# Patient Record
Sex: Female | Born: 1965 | Race: White | Hispanic: No | Marital: Single | Smoking: Never smoker
Health system: Southern US, Community
[De-identification: ages and names within clinical notes are randomized; demographics above are authoritative.]

## PROBLEM LIST (undated history)

## (undated) DIAGNOSIS — F419 Anxiety disorder, unspecified: Secondary | ICD-10-CM

## (undated) DIAGNOSIS — F329 Major depressive disorder, single episode, unspecified: Secondary | ICD-10-CM

## (undated) DIAGNOSIS — M1990 Unspecified osteoarthritis, unspecified site: Secondary | ICD-10-CM

## (undated) DIAGNOSIS — J45909 Unspecified asthma, uncomplicated: Secondary | ICD-10-CM

## (undated) DIAGNOSIS — J449 Chronic obstructive pulmonary disease, unspecified: Secondary | ICD-10-CM

## (undated) DIAGNOSIS — T7840XA Allergy, unspecified, initial encounter: Secondary | ICD-10-CM

## (undated) DIAGNOSIS — F32A Depression, unspecified: Secondary | ICD-10-CM

## (undated) DIAGNOSIS — E785 Hyperlipidemia, unspecified: Secondary | ICD-10-CM

## (undated) DIAGNOSIS — F4310 Post-traumatic stress disorder, unspecified: Secondary | ICD-10-CM

## (undated) HISTORY — PX: JOINT REPLACEMENT: SHX530

## (undated) HISTORY — DX: Allergy, unspecified, initial encounter: T78.40XA

## (undated) HISTORY — DX: Hyperlipidemia, unspecified: E78.5

## (undated) HISTORY — DX: Anxiety disorder, unspecified: F41.9

## (undated) HISTORY — DX: Unspecified osteoarthritis, unspecified site: M19.90

## (undated) HISTORY — PX: TONSILLECTOMY: SHX5217

## (undated) HISTORY — DX: Post-traumatic stress disorder, unspecified: F43.10

## (undated) HISTORY — PX: BREAST SURGERY: SHX581

## (undated) HISTORY — PX: BREAST LUMPECTOMY: SHX2

## (undated) HISTORY — DX: Depression, unspecified: F32.A

## (undated) HISTORY — DX: Unspecified asthma, uncomplicated: J45.909

## (undated) HISTORY — PX: REDUCTION MAMMAPLASTY: SUR839

## (undated) HISTORY — DX: Major depressive disorder, single episode, unspecified: F32.9

## (undated) HISTORY — DX: Chronic obstructive pulmonary disease, unspecified: J44.9

---

## 2016-04-10 DIAGNOSIS — T7840XA Allergy, unspecified, initial encounter: Secondary | ICD-10-CM | POA: Insufficient documentation

## 2016-04-10 DIAGNOSIS — J45909 Unspecified asthma, uncomplicated: Secondary | ICD-10-CM | POA: Insufficient documentation

## 2016-04-10 DIAGNOSIS — F419 Anxiety disorder, unspecified: Secondary | ICD-10-CM | POA: Insufficient documentation

## 2016-04-10 DIAGNOSIS — F4310 Post-traumatic stress disorder, unspecified: Secondary | ICD-10-CM | POA: Insufficient documentation

## 2016-04-10 DIAGNOSIS — F29 Unspecified psychosis not due to a substance or known physiological condition: Secondary | ICD-10-CM | POA: Insufficient documentation

## 2016-04-10 DIAGNOSIS — F32A Depression, unspecified: Secondary | ICD-10-CM | POA: Insufficient documentation

## 2016-04-10 DIAGNOSIS — F329 Major depressive disorder, single episode, unspecified: Secondary | ICD-10-CM | POA: Insufficient documentation

## 2016-11-02 ENCOUNTER — Encounter: Payer: Self-pay | Admitting: Physician Assistant

## 2016-11-02 ENCOUNTER — Ambulatory Visit (INDEPENDENT_AMBULATORY_CARE_PROVIDER_SITE_OTHER): Payer: Medicare Other | Admitting: Physician Assistant

## 2016-11-02 VITALS — BP 113/81 | HR 71 | Temp 97.3°F | Resp 16 | Ht 68.0 in | Wt 208.0 lb

## 2016-11-02 DIAGNOSIS — H16202 Unspecified keratoconjunctivitis, left eye: Secondary | ICD-10-CM

## 2016-11-02 NOTE — Patient Instructions (Signed)
     IF you received an x-ray today, you will receive an invoice from [facility]. Please contact [facility] at [phone] with questions or concerns regarding your invoice.   IF you received labwork today, you will receive an invoice from [facility]. Please contact [facility] at [phone] with questions or concerns regarding your invoice.   Our billing staff will not be able to assist you with questions regarding bills from these companies.  You will be contacted with the lab results as soon as they are available. The fastest way to get your results is to activate your My Chart account. Instructions are located on the last page of this paperwork. If you have not heard from us regarding the results in 2 weeks, please contact this office.     

## 2016-11-02 NOTE — Progress Notes (Signed)
    11/02/2016 4:34 PM   DOB: 11-18-1965 / MRN: [id number]  SUBJECTIVE:  Lacey French is a 51 y.o. female presenting for eye pain and redness that started last night.  Tells me that her vision is blurry.  Denies photophobia and contact len use.  Feels that she is getting worse.   She is allergic to prozac [fluoxetine].   She  has a past medical history of Allergy; Anxiety; Arthritis; Asthma; COPD (chronic obstructive pulmonary disease) ([place]); Depression; Hyperlipidemia; and PTSD (post-traumatic stress disorder).    She  reports that she has never smoked. She has never used smokeless tobacco. She reports that she drinks alcohol. She reports that she does not use drugs. She  has no sexual activity history on file. The patient  has a past surgical history that includes Breast surgery; Tonsillectomy; and Joint replacement.  Her family history includes Cancer in her mother; Diabetes in her father and sister; Hyperlipidemia in her sister; Hypertension in her mother and sister.  Review of Systems  Constitutional: Negative for chills, diaphoresis and fever.  Eyes: Positive for blurred vision, pain, discharge and redness. Negative for photophobia.  Gastrointestinal: Negative for nausea.  Skin: Negative for rash.  Neurological: Negative for dizziness and headaches.    The problem list and medications were reviewed and updated by myself where necessary and exist elsewhere in the encounter.   OBJECTIVE:  BP 113/81 (BP Location: Right Arm, Patient Position: Sitting, Cuff Size: Large)   Pulse 71   Temp (!) 97.3 F (36.3 C) (Oral)   Resp 16   Ht 5\' 8"  (1.727 m)   Wt 208 lb (94.3 kg)   SpO2 96%   BMI 31.63 kg/m   Physical Exam  Constitutional: She is active.  Non-toxic appearance.  Eyes: Left eye exhibits no exudate and no hordeolum. No foreign body present in the left eye. Left conjunctiva is injected. Left conjunctiva has no hemorrhage. Left eye exhibits normal extraocular motion. Left pupil  is round and reactive. Pupils are equal.  Fundoscopic exam:      The left eye shows no hemorrhage. The left eye shows red reflex.    Cardiovascular: Normal rate, regular rhythm, S1 normal, S2 normal, normal heart sounds and intact distal pulses.  Exam reveals no gallop, no friction rub and no decreased pulses.   No murmur heard. Pulmonary/Chest: Effort normal. No tachypnea. She has no rales.  Abdominal: She exhibits no distension.  Musculoskeletal: She exhibits no edema.  Lymphadenopathy:       Head (left side): Tonsillar and preauricular adenopathy present.  Neurological: She is alert.  Skin: Skin is warm and dry. She is not diaphoretic. No pallor.    No results found for this or any previous visit (from the past 72 hour(s)).  No results found.  ASSESSMENT AND PLAN:  Lacey French was seen today for facial swelling.  Diagnoses and all orders for this visit:  Keratoconjunctivitis of left eye: I have spoken with Dr. Katy Fitch who asked me to send the patient over directly to him.      The patient is advised to call or return to clinic if she does not see an improvement in symptoms, or to seek the care of the closest emergency department if she worsens with the above plan.   Philis Fendt, MHS, PA-C [facility] 11/02/2016 4:34 PM

## 2017-05-16 ENCOUNTER — Ambulatory Visit (INDEPENDENT_AMBULATORY_CARE_PROVIDER_SITE_OTHER): Payer: Medicare Other | Admitting: Family Medicine

## 2017-05-16 ENCOUNTER — Encounter: Payer: Self-pay | Admitting: Family Medicine

## 2017-05-16 VITALS — BP 108/88 | Ht 68.0 in | Wt 208.0 lb

## 2017-05-16 DIAGNOSIS — G5602 Carpal tunnel syndrome, left upper limb: Secondary | ICD-10-CM

## 2017-05-16 MED ORDER — PREDNISONE 10 MG PO TABS: ORAL_TABLET | ORAL | 0 refills | Status: DC

## 2017-05-16 NOTE — Progress Notes (Signed)
   HPI  [name] is a 52 y/o woman here after about 2 months of left handed numbness and weakness. She first noticed the symptoms after waking up with a sensation of pins and needles. This persisted and she later noticed loss of grip strength while rowing. Her numbness and weakness have persisted which is prvcenting her usual rowing without major improvement. She also has experienced some neck pain and tension during this same time frame. There is no radiation of symptoms and no left arm numbness above the wrist. She has tried visiting a chiropracter for neck manipulation which was slightly helpful to her neck pain but had no effect on arm symptoms.  CC: Left wrist and hand numbness  Medications/Interventions Tried: Chiropractor   See HPI and/or previous note for associated ROS.  Objective: BP 108/88   Ht 5\' 8"  (1.727 m)   Wt 208 lb (94.3 kg)   BMI 31.63 kg/m  Gen: Left-Hand Dominant. NAD, well groomed, a/o x3, normal affect.  CV: Well-perfused, peripheral pulses symmetric, normal capillary refill Resp: Non-labored.  Neuro: Sensation is subjectively decreased in left hand throughout palm and all 5 fingers but grossly intact, brachioradialis and abductor pollicis reflexes are intact symmetrically Gait: Unremarkable stride without signs of limp or balance issues. MSK: Both hands are without swelling or erythema. ROM is intact bilaterally. Strength is markedly diminished in the left hand from 1st-5th digits. Wrist flexion and extension is preserved. Phalen test is positive on the left within 10-15 seconds, Tinel's negative. Left arm strength is normal proximally and negative Hawkin's test. Her heck is mildly restricted in side bending with posterior muscular tenderness extending from the occiput to rhomboid. There is no radiation of pain.  Assessment and plan:  Carpal tunnel syndrome of left wrist This appears to be severe carpal tunnel disease with marked weakness in her dominant hand at  the time of presentation. To avoid risk for long term atrophy we will simultaneously refer for orthopedic surgery evaluation while beginning medical care. A nerve conduction test is an option but clinically the symptoms are apparent today. She has a high degree of anxiety about any aggressive intervention. Start wrist splinting continuously for 1 week then at least nightly 4-6 weeks. Prednisone taper prescribed today for 6 days and she can use aleve as needed afterwards. She has an appointment urgently with Orthopedic clinic for an initial evaluation.   Orders Placed This Encounter  Procedures  . Ambulatory referral to Orthopedic Surgery    Referral Priority:   Routine    Referral Type:   Surgical    Referral Reason:   Specialty Services Required    Referred to Provider:   Milly Jakob, MD    Requested Specialty:   Orthopedic Surgery    Number of Visits Requested:   1    Meds ordered this encounter  Medications  . predniSONE (DELTASONE) 10 MG tablet    Sig: Use as directed per doctors orders.    Dispense:  21 tablet    Refill:  0   Lacey Leatherwood, MD,MS [facility] 05/16/2017 5:31 PM

## 2017-05-16 NOTE — Assessment & Plan Note (Signed)
This appears to be severe carpal tunnel disease with marked weakness in her dominant hand at the time of presentation. To avoid risk for long term atrophy we will simultaneously refer for orthopedic surgery evaluation while beginning medical care. A nerve conduction test is an option but clinically the symptoms are apparent today. She has a high degree of anxiety about any aggressive intervention. Start wrist splinting continuously for 1 week then at least nightly 4-6 weeks. Prednisone taper prescribed today for 6 days and she can use aleve as needed afterwards. She has an appointment urgently with Orthopedic clinic for an initial evaluation.

## 2017-05-16 NOTE — Patient Instructions (Signed)
Dr. [facility] [address] [place] [phone]  Appt: 05/17/2017 at 3:45 pm - please arrive at 3:15 pm

## 2017-05-16 NOTE — Progress Notes (Deleted)
   HPI  CC: Left wrist and hand numbness   Medications/Interventions Tried: ***  See HPI and/or previous note for associated ROS.  Objective: There were no vitals taken for this visit. Gen: ***-Hand Dominant. NAD, well groomed, a/o x3, normal affect.  CV: Well-perfused. Warm.  Resp: Non-labored.  Neuro: Sensation intact throughout. No*** gross coordination deficits.  Gait: ***Nonpathologic posture, ***unremarkable stride without signs of limp or balance issues. ***  Assessment and plan:  No problem-specific Assessment & Plan notes found for this encounter.   No orders of the defined types were placed in this encounter.   No orders of the defined types were placed in this encounter.

## 2017-09-03 ENCOUNTER — Other Ambulatory Visit: Payer: Self-pay

## 2017-09-03 ENCOUNTER — Ambulatory Visit: Payer: Medicare Other | Attending: Orthopedic Surgery | Admitting: Physical Therapy

## 2017-09-03 ENCOUNTER — Encounter: Payer: Self-pay | Admitting: Physical Therapy

## 2017-09-03 DIAGNOSIS — M542 Cervicalgia: Secondary | ICD-10-CM

## 2017-09-03 DIAGNOSIS — M25532 Pain in left wrist: Secondary | ICD-10-CM

## 2017-09-03 DIAGNOSIS — M6281 Muscle weakness (generalized): Secondary | ICD-10-CM

## 2017-09-03 DIAGNOSIS — M25531 Pain in right wrist: Secondary | ICD-10-CM | POA: Diagnosis present

## 2017-09-03 DIAGNOSIS — M62838 Other muscle spasm: Secondary | ICD-10-CM

## 2017-09-03 NOTE — Patient Instructions (Addendum)
Access Code: [email or web address]  URL: [email or web address]  Date: 09/03/2017  Prepared by: Earlie Counts   Exercises  Standing Wrist Extension Stretch - 2 reps - 1 sets - 5 hold - 1x daily - 7x weekly  Median Nerve Flossing - 1 reps - 1 sets - 2x daily - 7x weekly  Patient Education  Cervical Stenosis  Carpal Tunnel Syndrome  Office Posture [facility] [address], [place], [place] [address] Phone # [phone] Fax [phone]   Trigger Point Dry Needling  . What is Trigger Point Dry Needling (DN)? o DN is a physical therapy technique used to treat muscle pain and dysfunction. Specifically, DN helps deactivate muscle trigger points (muscle knots).  o A thin filiform needle is used to penetrate the skin and stimulate the underlying trigger point. The goal is for a local twitch response (LTR) to occur and for the trigger point to relax. No medication of any kind is injected during the procedure.   . What Does Trigger Point Dry Needling Feel Like?  o The procedure feels different for each individual patient. Some patients report that they do not actually feel the needle enter the skin and overall the process is not painful. Very mild bleeding may occur. However, many patients feel a deep cramping in the muscle in which the needle was inserted. This is the local twitch response.   Marland Kitchen How Will I feel after the treatment? o Soreness is normal, and the onset of soreness may not occur for a few hours. Typically this soreness does not last longer than two days.  o Bruising is uncommon, however; ice can be used to decrease any possible bruising.  o In rare cases feeling tired or nauseous after the treatment is normal. In addition, your symptoms may get worse before they get better, this period will typically not last longer than 24 hours.   . What Can I do After My Treatment? o Increase your hydration by drinking more water for the next 24 hours. o You may place  ice or heat on the areas treated that have become sore, however, do not use heat on inflamed or bruised areas. Heat often brings more relief post needling. o You can continue your regular activities, but vigorous activity is not recommended initially after the treatment for 24 hours. o DN is best combined with other physical therapy such as strengthening, stretching, and other therapies.    [facility] [address], [place], [place] [address] Phone # [phone] Fax [phone]

## 2017-09-03 NOTE — Therapy (Signed)
[facility] [address] [place], [place], [address] Phone: [phone]   Fax:  [phone]  Physical Therapy Evaluation  Patient Details  Name: Lacey French MRN: [id number] Date of Birth: [birth year] Referring Provider: Dr. Melina Schools   Encounter Date: 09/03/2017  PT End of Session - 09/03/17 1323    Visit Number  1    Date for PT Re-Evaluation  10/01/17    Authorization Type  medicare    PT Start Time  1145    PT Stop Time  1230    PT Time Calculation (min)  45 min    Activity Tolerance  Patient tolerated treatment well    Behavior During Therapy  [facility] for tasks assessed/performed       Past Medical History:  Diagnosis Date  . Allergy   . Anxiety   . Arthritis   . Asthma   . COPD (chronic obstructive pulmonary disease) ([place])   . Depression   . Hyperlipidemia   . PTSD (post-traumatic stress disorder)     Past Surgical History:  Procedure Laterality Date  . BREAST SURGERY     REDUCTION  . JOINT REPLACEMENT     L shoulder, L knee  . TONSILLECTOMY      There were no vitals filed for this visit.   Subjective Assessment - 09/03/17 1156    Subjective  Patient reports [place].  Had injections in the spine before.  Pain is chronic. 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  Started last 2 on lef fingers and first 2 on right.  Patient reports nerve pain from elbow to fingers bilaterally.     Patient Stated Goals  reduce pain, how ot manage pain, full motion use  of arms and increase arm strength    Currently in Pain?  Yes    Pain Score  6     Pain Location  Neck    Pain Orientation  Right;Left    Pain Descriptors / Indicators  Stabbing;Throbbing    Pain Type  Chronic pain    Pain Onset  More than a month ago    Pain Frequency  Constant head and neck is intermittent    Aggravating Factors   movement, sleeping    Pain Relieving Factors   nothing helps the pain         [facility] PT Assessment - 09/03/17 0001      Assessment   Medical Diagnosis  M54.12 Radiculopathy, cervical region    Referring Provider  Dr. Melina Schools    Prior Therapy  yes long time ago      Precautions   Precautions  None      Restrictions   Weight Bearing Restrictions  No      Balance Screen   Has the patient fallen in the past 6 months  No    Has the patient had a decrease in activity level because of a fear of falling?   No    Is the patient reluctant to leave their home because of a fear of falling?   No      Home Film/video editor residence      Prior Function   Level of Independence  Independent    Leisure  weight training      Cognition   Overall Cognitive Status  Within Functional Limits for tasks assessed  Observation/Other Assessments   Focus on Therapeutic Outcomes (FOTO)   47% limitation; 41% limitation      ROM / Strength   AROM / PROM / Strength  AROM;PROM;Strength      AROM   Overall AROM Comments  bilateral shoulder, elbow and wrist ROM is full    Cervical Flexion  28    Cervical Extension  30    Cervical - Right Side Bend  15    Cervical - Left Side Bend  10    Cervical - Right Rotation  45    Cervical - Left Rotation  30    Thoracic - Right Rotation  decreased by 50%    Thoracic - Left Rotation  decreased by 50%      Strength   Right Shoulder Flexion  3+/5    Right Shoulder Extension  4+/5    Right Shoulder ABduction  4+/5    Right Shoulder Internal Rotation  5/5    Right Shoulder External Rotation  5/5    Left Shoulder Flexion  3+/5    Left Shoulder Extension  4+/5    Left Shoulder ABduction  4+/5    Left Shoulder Internal Rotation  5/5    Left Shoulder External Rotation  5/5    Right Forearm Pronation  4/5    Right Forearm Supination  4/5    Left Forearm Pronation  4/5    Left Forearm Supination  4/5    Right Wrist Flexion  3/5    Right Wrist Extension  3/5    Left  Wrist Flexion  4+/5    Left Wrist Extension  3+/5    Right Hand Grip (lbs)  13    Left Hand Grip (lbs)  65                Objective measurements completed on examination: See above findings.              PT Education - 09/03/17 1322    Education Details  Access Code: [email or web address] ; nerve flossing and wrist extensor stretch; information on dry needling    Person(s) Educated  Patient    Methods  Explanation;Demonstration;Verbal cues;Handout    Comprehension  Verbalized understanding;Returned demonstration          PT Long Term Goals - 09/03/17 1539      PT LONG TERM GOAL #1   Title  Independent with HEP and understand how to progress herself    Time  4    Period  Weeks    Status  New    Target Date  10/01/17      PT LONG TERM GOAL #2   Title  ability to perfrom daily tasks with moderate to minimal pain due to improved flexibility    Time  4    Period  Weeks    Status  New    Target Date  10/01/17      PT LONG TERM GOAL #3   Title  understand correct posture to reduce strain on cervical and radiculopathy     Time  4    Period  Weeks    Status  New    Target Date  10/01/17      PT LONG TERM GOAL #4   Title  ability to lift a small grocery bag >/= 5 pound with >/= 40% greater ease due to improved posture and strength    Time  4    Period  Weeks    Status  New    Target Date  10/01/17             Plan - 09/03/17 1326    Clinical Impression Statement  Patient is a 52 year old female with chronic neck pain.  Patient has started to have cervical pain with radiculopathy into both arms.  Patient reports she has bilateral carpal tunnel and stenosis of cervical.  Patient reports cervical pain at level 6/10 with movement, sleep and performins daily activities. Patient has numbness and tingling in bilateral lower arms and hands.  Patient has full ROM of bilateral shoulder, elbow and hand.  Patient has weakness in bilateral shoulders, elbows and hands.  Grip  strength is significantly worse than the right compared to the right. Patient has difficulty with gripping items and using her arms due to weakness and pain.  Patient has limited cervical ROM due to pain.  Patient will benefit from skilled therapy to reduce her pain and improve her function to improve her quality of life.     History and Personal Factors relevant to plan of care:  depression, anxiety, COPD, PTSD, Anxiety    Clinical Presentation  Evolving    Clinical Presentation due to:  patient has difficulty with daily tasks due to pain and weakness    Clinical Decision Making  Moderate    Rehab Potential  Good    Clinical Impairments Affecting Rehab Potential  depression, anxiety, COPD, PTSD, Anxiety    PT Frequency  2x / week    PT Duration  4 weeks    PT Treatment/Interventions  Dry needling;Manual techniques;Cryotherapy;Electrical Stimulation;Moist Heat;Traction;Ultrasound;Therapeutic exercise;Therapeutic activities;Neuromuscular re-education;Patient/family education;Other (comment) instruction on home tens unit    PT Next Visit Plan  cervical traction; neural tension stretch, soft tissue work to cervical; Cervical ROM exercises and stretches    PT Home Exercise Plan  Access Code: [email or web address]    Consulted and Agree with Plan of Care  Patient       Patient will benefit from skilled therapeutic intervention in order to improve the following deficits and impairments:  Increased fascial restricitons, Pain, Decreased mobility, Increased muscle spasms, Decreased endurance, Decreased activity tolerance, Decreased range of motion, Decreased strength  Visit Diagnosis: Cervicalgia - Plan: PT plan of care cert/re-cert  Muscle weakness (generalized) - Plan: PT plan of care cert/re-cert  Other muscle spasm - Plan: PT plan of care cert/re-cert  Pain in left wrist - Plan: PT plan of care cert/re-cert  Pain in right wrist - Plan: PT plan of care cert/re-cert     Problem List Patient Active  Problem List   Diagnosis Date Noted  . Carpal tunnel syndrome of left wrist 05/16/2017  . PTSD (post-traumatic stress disorder) 04/10/2016  . Psychosis ([place]) 04/10/2016  . Anxiety 04/10/2016  . Allergy 04/10/2016  . Asthma 04/10/2016  . Depression 04/10/2016    Earlie Counts, PT 09/03/17 3:58 PM   [facility] [address] [place], [place], [address] Phone: [phone]   Fax:  [phone]  Name: Keren Alverio MRN: [id number] Date of Birth: Apr 30, 1965

## 2017-09-05 ENCOUNTER — Ambulatory Visit: Payer: Medicare Other | Admitting: Physical Therapy

## 2017-09-05 ENCOUNTER — Encounter: Payer: Self-pay | Admitting: Physical Therapy

## 2017-09-05 DIAGNOSIS — M62838 Other muscle spasm: Secondary | ICD-10-CM

## 2017-09-05 DIAGNOSIS — M25531 Pain in right wrist: Secondary | ICD-10-CM

## 2017-09-05 DIAGNOSIS — M542 Cervicalgia: Secondary | ICD-10-CM | POA: Diagnosis not present

## 2017-09-05 DIAGNOSIS — M6281 Muscle weakness (generalized): Secondary | ICD-10-CM

## 2017-09-05 DIAGNOSIS — M25532 Pain in left wrist: Secondary | ICD-10-CM

## 2017-09-05 NOTE — Therapy (Signed)
[facility] [address] [place], [place], [address] Phone: [phone]   Fax:  [phone]  Physical Therapy Treatment  Patient Details  Name: Lacey French MRN: [id number] Date of Birth: 1966/02/20 Referring Provider: Dr. Melina Schools   Encounter Date: 09/05/2017  PT End of Session - 09/05/17 1316    Visit Number  1    Date for PT Re-Evaluation  10/01/17    Authorization Type  medicare    PT Start Time  1230    PT Stop Time  1315    PT Time Calculation (min)  45 min    Activity Tolerance  Patient tolerated treatment well    Behavior During Therapy  [facility] for tasks assessed/performed       Past Medical History:  Diagnosis Date  . Allergy   . Anxiety   . Arthritis   . Asthma   . COPD (chronic obstructive pulmonary disease) ([place])   . Depression   . Hyperlipidemia   . PTSD (post-traumatic stress disorder)     Past Surgical History:  Procedure Laterality Date  . BREAST SURGERY     REDUCTION  . JOINT REPLACEMENT     L shoulder, L knee  . TONSILLECTOMY      There were no vitals filed for this visit.  Subjective Assessment - 09/05/17 1231    Subjective  I felt good after the evaluation.     Patient Stated Goals  reduce pain, how ot manage pain, full motion use  of arms and increase arm strength    Currently in Pain?  Yes    Pain Score  7     Pain Location  Neck arms    Pain Orientation  Right;Left    Pain Descriptors / Indicators  Stabbing;Throbbing    Pain Type  Chronic pain    Pain Onset  More than a month ago    Pain Frequency  Constant head and neck is intermittent    Aggravating Factors   movement, sleeping    Pain Relieving Factors  nothing helps the pain    Multiple Pain Sites  No                       [facility] Adult PT Treatment/Exercise - 09/05/17 0001      Modalities   Modalities  Traction      Traction   Type of Traction  Cervical    Min (lbs)  5    Max (lbs)  10    Time  13 min, had to stop due to pressure  feeling increased in lower head      Manual Therapy   Manual Therapy  Soft tissue mobilization;Manual Traction    Soft tissue mobilization  bil. cervical paraspinals, suboccipitals, scalenes, upper trap    Manual Traction  1 min to see if patient will tolerate the mechanical traction and patient felt pressure.                   PT Long Term Goals - 09/03/17 1539      PT LONG TERM GOAL #1   Title  Independent with HEP and understand how to progress herself    Time  4    Period  Weeks    Status  New    Target Date  10/01/17      PT LONG TERM GOAL #2   Title  ability to perfrom daily tasks with moderate to minimal pain  due to improved flexibility    Time  4    Period  Weeks    Status  New    Target Date  10/01/17      PT LONG TERM GOAL #3   Title  understand correct posture to reduce strain on cervical and radiculopathy     Time  4    Period  Weeks    Status  New    Target Date  10/01/17      PT LONG TERM GOAL #4   Title  ability to lift a small grocery bag >/= 5 pound with >/= 40% greater ease due to improved posture and strength    Time  4    Period  Weeks    Status  New    Target Date  10/01/17            Plan - 09/05/17 1323    Clinical Impression Statement  Patient just started therapy so has not met goals. Patient was able to tolerate the traction for 13 minutes and she was closely monitored due to her medical history. Patient cervical muscles were softer after soft tissue work.  Patient will benefit from skilled therapy to reduce her pain and improve her funciton to iimprove her quality of life.     Rehab Potential  Good    Clinical Impairments Affecting Rehab Potential  depression, anxiety, COPD, PTSD, Anxiety    PT Frequency  2x / week    PT Duration  4 weeks    PT Treatment/Interventions  Dry needling;Manual techniques;Cryotherapy;Electrical Stimulation;Moist Heat;Traction;Ultrasound;Therapeutic  exercise;Therapeutic activities;Neuromuscular re-education;Patient/family education;Other (comment)    PT Next Visit Plan  assess cervical traction; neural tension stretch, soft tissue work to cervical; Cervical ROM exercises and stretches    PT Home Exercise Plan  Access Code: [email or web address]    Recommended Other Services  MD signed initial note    Consulted and Agree with Plan of Care  Patient       Patient will benefit from skilled therapeutic intervention in order to improve the following deficits and impairments:  Increased fascial restricitons, Pain, Decreased mobility, Increased muscle spasms, Decreased endurance, Decreased activity tolerance, Decreased range of motion, Decreased strength  Visit Diagnosis: Cervicalgia  Muscle weakness (generalized)  Other muscle spasm  Pain in left wrist  Pain in right wrist     Problem List Patient Active Problem List   Diagnosis Date Noted  . Carpal tunnel syndrome of left wrist 05/16/2017  . PTSD (post-traumatic stress disorder) 04/10/2016  . Psychosis ([place]) 04/10/2016  . Anxiety 04/10/2016  . Allergy 04/10/2016  . Asthma 04/10/2016  . Depression 04/10/2016    Lacey French, PT 09/05/17 1:29 PM   [facility] [address], [place], [place], [address] Phone: [phone]   Fax:  [phone]  Name: Lacey French MRN: [id number] Date of Birth: [birth year]

## 2017-09-10 ENCOUNTER — Ambulatory Visit: Payer: Medicare Other | Admitting: Physical Therapy

## 2017-09-11 ENCOUNTER — Ambulatory Visit: Payer: Medicare Other | Admitting: Physical Therapy

## 2017-09-11 ENCOUNTER — Encounter: Payer: Self-pay | Admitting: Physical Therapy

## 2017-09-11 DIAGNOSIS — M6281 Muscle weakness (generalized): Secondary | ICD-10-CM

## 2017-09-11 DIAGNOSIS — M542 Cervicalgia: Secondary | ICD-10-CM | POA: Diagnosis not present

## 2017-09-11 DIAGNOSIS — M25531 Pain in right wrist: Secondary | ICD-10-CM

## 2017-09-11 DIAGNOSIS — M25532 Pain in left wrist: Secondary | ICD-10-CM

## 2017-09-11 DIAGNOSIS — M62838 Other muscle spasm: Secondary | ICD-10-CM

## 2017-09-11 NOTE — Patient Instructions (Signed)

## 2017-09-11 NOTE — Therapy (Signed)
[facility] [address] [place], [place], [address] Phone: [phone]   Fax:  [phone]  Physical Therapy Treatment  Patient Details  Name: Kameria Canizares MRN: [id number] Date of Birth: June 15, 1965 Referring Provider: Dr. Melina Schools   Encounter Date: 09/11/2017  PT End of Session - 09/11/17 1454    Visit Number  2    Date for PT Re-Evaluation  10/01/17    Authorization Type  medicare    PT Start Time  1400    PT Stop Time  1445    PT Time Calculation (min)  45 min    Activity Tolerance  Patient tolerated treatment well;No increased pain    Behavior During Therapy  W[facility] for tasks assessed/performed       Past Medical History:  Diagnosis Date  . Allergy   . Anxiety   . Arthritis   . Asthma   . COPD (chronic obstructive pulmonary disease) ([place])   . Depression   . Hyperlipidemia   . PTSD (post-traumatic stress disorder)     Past Surgical History:  Procedure Laterality Date  . BREAST SURGERY     REDUCTION  . JOINT REPLACEMENT     L shoulder, L knee  . TONSILLECTOMY      There were no vitals filed for this visit.  Subjective Assessment - 09/11/17 1404    Subjective  Pt notes that she does not want to do traction again becasue she is still sore from it. She would like to learn how to use the tens unit at home.     Patient Stated Goals  reduce pain, how ot manage pain, full motion use  of arms and increase arm strength    Currently in Pain?  Yes    Pain Score  8     Pain Location  Neck    Pain Orientation  Right;Left    Pain Descriptors / Indicators  -- throbbing, aching, numbness     Pain Type  Chronic pain    Pain Onset  More than a month ago    Pain Frequency  Constant    Aggravating Factors   movement          [facility] PT Assessment - 09/11/17 0001      AROM   Cervical - Right Rotation  70    Cervical - Left Rotation  40                   [facility] Adult PT Treatment/Exercise -  09/11/17 0001      Self-Care   Self-Care  Other Self-Care Comments    Other Self-Care Comments   set up and use of home TENS; dry needling info and after care      Exercises   Exercises  Neck      Neck Exercises: Machines for Strengthening   UBE (Upper Arm Bike)  x2 min forward/backward      Manual Therapy   Soft tissue mobilization  STM Lt and Rt upper trap, cervical paraspinals, sub occipital release       Trigger Point Dry Needling - 09/11/17 1518    Consent Given?  Yes    Education Handout Provided  Yes    Muscles Treated Upper Body  Upper trapezius;Oblique capitus    Upper Trapezius Response  Twitch reponse elicited;Palpable increased muscle length    Oblique Capitus Response  Twitch response elicited;Palpable increased muscle length           PT  Education - 09/11/17 1452    Education Details  dry needling info; set up of TENS    Person(s) Educated  Patient    Methods  Explanation;Handout    Comprehension  Verbalized understanding          PT Long Term Goals - 09/03/17 1539      PT LONG TERM GOAL #1   Title  Independent with HEP and understand how to progress herself    Time  4    Period  Weeks    Status  New    Target Date  10/01/17      PT LONG TERM GOAL #2   Title  ability to perfrom daily tasks with moderate to minimal pain due to improved flexibility    Time  4    Period  Weeks    Status  New    Target Date  10/01/17      PT LONG TERM GOAL #3   Title  understand correct posture to reduce strain on cervical and radiculopathy     Time  4    Period  Weeks    Status  New    Target Date  10/01/17      PT LONG TERM GOAL #4   Title  ability to lift a small grocery bag >/= 5 pound with >/= 40% greater ease due to improved posture and strength    Time  4    Period  Weeks    Status  New    Target Date  10/01/17            Plan - 09/11/17 1500    Clinical Impression Statement  Pt responded poorly to mechanical traction, reporting increased  soreness and tightness around the neck upon arrival. She brought her home TENS unit which the therapist was able to educate on its use and set up for home. Also completed manual treatment and dry needling to the neck with improved flexibility of the upper traps and cervical paraspinals. Pt has significant muscle spasm in sub occipitals and would benefit from further treatment to this area to decrease headaches and improve cervical ROM.     Rehab Potential  Good    Clinical Impairments Affecting Rehab Potential  depression, anxiety, COPD, PTSD, Anxiety    PT Frequency  2x / week    PT Duration  4 weeks    PT Treatment/Interventions  Dry needling;Manual techniques;Cryotherapy;Electrical Stimulation;Moist Heat;Traction;Ultrasound;Therapeutic exercise;Therapeutic activities;Neuromuscular re-education;Patient/family education;Other (comment)    PT Next Visit Plan  neural tension stretch, soft tissue work to cervical; Cervical ROM exercises and stretches    PT Home Exercise Plan  Access Code: [email or web address]    Consulted and Agree with Plan of Care  Patient       Patient will benefit from skilled therapeutic intervention in order to improve the following deficits and impairments:  Increased fascial restricitons, Pain, Decreased mobility, Increased muscle spasms, Decreased endurance, Decreased activity tolerance, Decreased range of motion, Decreased strength  Visit Diagnosis: Cervicalgia  Muscle weakness (generalized)  Other muscle spasm  Pain in left wrist  Pain in right wrist     Problem List Patient Active Problem List   Diagnosis Date Noted  . Carpal tunnel syndrome of left wrist 05/16/2017  . PTSD (post-traumatic stress disorder) 04/10/2016  . Psychosis ([place]) 04/10/2016  . Anxiety 04/10/2016  . Allergy 04/10/2016  . Asthma 04/10/2016  . Depression 04/10/2016    3:20 PM,09/11/17 Sherol Dade PT, DPT [place] [facility]  [facility] [address] [place], [place], [address] Phone: [phone]   Fax:  [phone]  Name: Aitana Burry MRN: [id number] Date of Birth: 04-30-1965

## 2017-09-13 ENCOUNTER — Encounter: Payer: Self-pay | Admitting: Physical Therapy

## 2017-09-13 ENCOUNTER — Ambulatory Visit: Payer: Medicare Other | Admitting: Physical Therapy

## 2017-09-13 DIAGNOSIS — M25531 Pain in right wrist: Secondary | ICD-10-CM

## 2017-09-13 DIAGNOSIS — M25532 Pain in left wrist: Secondary | ICD-10-CM

## 2017-09-13 DIAGNOSIS — M62838 Other muscle spasm: Secondary | ICD-10-CM

## 2017-09-13 DIAGNOSIS — M6281 Muscle weakness (generalized): Secondary | ICD-10-CM

## 2017-09-13 DIAGNOSIS — M542 Cervicalgia: Secondary | ICD-10-CM

## 2017-09-13 NOTE — Therapy (Signed)
[facility] [address], [place], [place], [address] Phone: [phone]   Fax:  [phone]  Physical Therapy Treatment  Patient Details  Name: Lacey French MRN: [id number] Date of Birth: 12/30/1965 Referring Provider: Dr. Melina Schools   Encounter Date: 09/13/2017  PT End of Session - 09/13/17 1139    Visit Number  4    Date for PT Re-Evaluation  10/01/17    Authorization Type  medicare    PT Start Time  1100    PT Stop Time  1155    PT Time Calculation (min)  55 min    Activity Tolerance  Patient tolerated treatment well;Patient limited by pain    Behavior During Therapy  [facility] for tasks assessed/performed       Past Medical History:  Diagnosis Date  . Allergy   . Anxiety   . Arthritis   . Asthma   . COPD (chronic obstructive pulmonary disease) ([place])   . Depression   . Hyperlipidemia   . PTSD (post-traumatic stress disorder)     Past Surgical History:  Procedure Laterality Date  . BREAST SURGERY     REDUCTION  . JOINT REPLACEMENT     L shoulder, L knee  . TONSILLECTOMY      There were no vitals filed for this visit.  Subjective Assessment - 09/13/17 1103    Subjective  I am sore all over.  I have had trouble with gripping things today.  I had pain 5 days straight and persistent headache.     Patient Stated Goals  reduce pain, how ot manage pain, full motion use  of arms and increase arm strength    Currently in Pain?  Yes    Pain Score  8     Pain Location  Neck    Pain Orientation  Right;Left    Pain Descriptors / Indicators  Throbbing;Aching;Numbness    Pain Onset  More than a month ago    Pain Frequency  Constant    Aggravating Factors   movement    Pain Relieving Factors  nothing helps the pain    Multiple Pain Sites  No         [facility] PT Assessment - 09/13/17 0001      PROM   Right Shoulder Flexion  120 Degrees after treatment 180 degrees                   [facility] Adult  PT Treatment/Exercise - 09/13/17 0001      Neck Exercises: Machines for Strengthening   UBE (Upper Arm Bike)  tried arm bike but trouble gripping and turning the pedals      Neck Exercises: Supine   Neck Retraction  10 reps;5 secs with neck roll    Shoulder Flexion  Right;Left;5 reps;Limitations    Shoulder Flexion Limitations  stopped due to limited right shoulder ROM     Other Supine Exercise  scapula retraction hold 5 sec, 10x      Modalities   Modalities  Electrical Stimulation;Moist Heat      Moist Heat Therapy   Number Minutes Moist Heat  20 Minutes    Moist Heat Location  Cervical      Electrical Stimulation   Electrical Stimulation Location  cervical    Electrical Stimulation Action  IFC    Electrical Stimulation Parameters  to patient tolerance, 20 minutes    Electrical Stimulation Goals  Pain      Manual Therapy  Manual Therapy  Joint mobilization;Soft tissue mobilization    Manual therapy comments  neural tension stretch of bil. shoulders    Joint Mobilization  right shoulder distraction, inferior glide, posterior glide but afterwards patient had increased right shoulder pain and could not perform shoulder flexion; joint mobilization to bil. carpals     Soft tissue mobilization  opening up of the palm of the hand to release the neres of the carpal tunnel                  PT Long Term Goals - 09/13/17 1142      PT LONG TERM GOAL #1   Title  Independent with HEP and understand how to progress herself    Time  4    Period  Weeks    Status  On-going      PT LONG TERM GOAL #2   Title  ability to perfrom daily tasks with moderate to minimal pain due to improved flexibility    Time  4    Period  Weeks    Status  On-going      PT LONG TERM GOAL #3   Title  understand correct posture to reduce strain on cervical and radiculopathy     Time  4    Period  Weeks    Status  On-going      PT LONG TERM GOAL #4   Title  ability to lift a small grocery bag  >/= 5 pound with >/= 40% greater ease due to improved posture and strength    Time  4    Period  Weeks    Status  On-going            Plan - 09/13/17 1139    Clinical Impression Statement  Patient was not able to perform the UBE due to weakness in the hands. Patient is limited by pain to perform exercises.  Patient had increased right shoulder flexion passively from 120 to 180 after manual work. Patient was not able to perform the right shoulder flexion exercise with new motion due to pain.  Patient had increased tissue mobility to bilateral palms but felt increased numbness in bilateral hands . Patient has multiple issues throughout her body making it a slow progress in therapy.  Patient will benefit from skilled therapy to improve mobilty and improve quality of life.     Rehab Potential  Good    Clinical Impairments Affecting Rehab Potential  depression, anxiety, COPD, PTSD, Anxiety    PT Frequency  2x / week    PT Duration  4 weeks    PT Treatment/Interventions  Dry needling;Manual techniques;Cryotherapy;Electrical Stimulation;Moist Heat;Traction;Ultrasound;Therapeutic exercise;Therapeutic activities;Neuromuscular re-education;Patient/family education;Other (comment)    PT Next Visit Plan  neural tension stretch, soft tissue work to cervical; Cervical ROM exercises and stretches; no traction    PT Home Exercise Plan  Access Code: [email or web address]    Consulted and Agree with Plan of Care  Patient       Patient will benefit from skilled therapeutic intervention in order to improve the following deficits and impairments:  Increased fascial restricitons, Pain, Decreased mobility, Increased muscle spasms, Decreased endurance, Decreased activity tolerance, Decreased range of motion, Decreased strength  Visit Diagnosis: Cervicalgia  Muscle weakness (generalized)  Other muscle spasm  Pain in left wrist  Pain in right wrist     Problem List Patient Active Problem List   Diagnosis Date  Noted  . Carpal tunnel syndrome of left wrist 05/16/2017  . PTSD (  post-traumatic stress disorder) 04/10/2016  . Psychosis ([place]) 04/10/2016  . Anxiety 04/10/2016  . Allergy 04/10/2016  . Asthma 04/10/2016  . Depression 04/10/2016    Earlie Counts, PT 09/13/17 11:44 AM   [facility] [address] [place], [place], [address] Phone: [phone]   Fax:  [phone]  Name: Kateryna Grantham MRN: [id number] Date of Birth: 26-Dec-1965

## 2017-09-17 ENCOUNTER — Encounter: Payer: Self-pay | Admitting: Physical Therapy

## 2017-09-17 ENCOUNTER — Ambulatory Visit: Payer: Medicare Other | Admitting: Physical Therapy

## 2017-09-17 DIAGNOSIS — M542 Cervicalgia: Secondary | ICD-10-CM

## 2017-09-17 DIAGNOSIS — M6281 Muscle weakness (generalized): Secondary | ICD-10-CM

## 2017-09-17 DIAGNOSIS — M25531 Pain in right wrist: Secondary | ICD-10-CM

## 2017-09-17 DIAGNOSIS — M62838 Other muscle spasm: Secondary | ICD-10-CM

## 2017-09-17 DIAGNOSIS — M25532 Pain in left wrist: Secondary | ICD-10-CM

## 2017-09-17 NOTE — Therapy (Signed)
[facility] [address] [place], [place], [address] Phone: [phone]   Fax:  [phone]  Physical Therapy Treatment  Patient Details  Name: Lacey French MRN: [id number] Date of Birth: 1965/04/30 Referring Provider: Dr. Melina Schools   Encounter Date: 09/17/2017  PT End of Session - 09/17/17 1011    Visit Number  5    Date for PT Re-Evaluation  10/01/17    Authorization Type  medicare    PT Start Time  0930    PT Stop Time  1018    PT Time Calculation (min)  48 min    Activity Tolerance  Patient tolerated treatment well;Patient limited by pain;No increased pain    Behavior During Therapy  [facility] for tasks assessed/performed       Past Medical History:  Diagnosis Date  . Allergy   . Anxiety   . Arthritis   . Asthma   . COPD (chronic obstructive pulmonary disease) ([place])   . Depression   . Hyperlipidemia   . PTSD (post-traumatic stress disorder)     Past Surgical History:  Procedure Laterality Date  . BREAST SURGERY     REDUCTION  . JOINT REPLACEMENT     L shoulder, L knee  . TONSILLECTOMY      There were no vitals filed for this visit.  Subjective Assessment - 09/17/17 0935    Subjective  I have been having big headaches and been taking Alleeve. After I do the neck flossing my neck gets very tight. The stim and heat helped.     Patient Stated Goals  reduce pain, how ot manage pain, full motion use  of arms and increase arm strength    Currently in Pain?  Yes    Pain Score  7     Pain Location  Neck    Pain Orientation  Right;Left    Pain Descriptors / Indicators  Throbbing;Aching;Numbness    Pain Type  Chronic pain    Pain Onset  More than a month ago    Pain Frequency  Constant    Aggravating Factors   movement    Pain Relieving Factors  nothing helps the pain    Multiple Pain Sites  No         [facility] PT Assessment - 09/17/17 0001      AROM   Cervical - Right Rotation  70    Cervical -  Left Rotation  40      PROM   Right Shoulder Flexion  120 Degrees after treatment 180 degrees                   [facility] Adult PT Treatment/Exercise - 09/17/17 0001      Neck Exercises: Supine   Neck Retraction  10 reps;5 secs with neck roll, increased neck pain    Shoulder Flexion  Right;Left;10 reps;Limitations    Shoulder Flexion Limitations  only to 90 degrees shoulder flexion    Other Supine Exercise  cervical isometrics for sidebending hold 5 sec 10x for SB, rotation, extension while monitoring for pain      Modalities   Modalities  Electrical Stimulation;Moist Heat      Moist Heat Therapy   Number Minutes Moist Heat  15 Minutes    Moist Heat Location  Cervical      Electrical Stimulation   Electrical Stimulation Location  cervical    Electrical Stimulation Action  IFC    Electrical Stimulation Parameters  to patient  tolerance, 15 minutes    Electrical Stimulation Goals  Pain      Manual Therapy   Manual Therapy  Soft tissue mobilization    Soft tissue mobilization  cervical paraspinals and subocciptials very gently                  PT Long Term Goals - 09/13/17 1142      PT LONG TERM GOAL #1   Title  Independent with HEP and understand how to progress herself    Time  4    Period  Weeks    Status  On-going      PT LONG TERM GOAL #2   Title  ability to perfrom daily tasks with moderate to minimal pain due to improved flexibility    Time  4    Period  Weeks    Status  On-going      PT LONG TERM GOAL #3   Title  understand correct posture to reduce strain on cervical and radiculopathy     Time  4    Period  Weeks    Status  On-going      PT LONG TERM GOAL #4   Title  ability to lift a small grocery bag >/= 5 pound with >/= 40% greater ease due to improved posture and strength    Time  4    Period  Weeks    Status  On-going            Plan - 09/17/17 1779    Clinical Impression Statement  Patient does well to end treatment with  electrical stimulation and moist heat. Patient was able to tolerate treatment without increased cervical pain.  Patient exercises need to be monitored due to her pain and limited mobility.  Patient has not met goals yet due to her pain and limited movement. Patient will benefit from skilled therapy to improve mobility and improve quality of life.     Rehab Potential  Good    Clinical Impairments Affecting Rehab Potential  depression, anxiety, COPD, PTSD, Anxiety    PT Frequency  2x / week    PT Duration  4 weeks    PT Treatment/Interventions  Dry needling;Manual techniques;Cryotherapy;Electrical Stimulation;Moist Heat;Traction;Ultrasound;Therapeutic exercise;Therapeutic activities;Neuromuscular re-education;Patient/family education;Other (comment)    PT Next Visit Plan  neural tension stretch, soft tissue work to cervical; Cervical ROM exercises and stretches; no traction    Consulted and Agree with Plan of Care  Patient       Patient will benefit from skilled therapeutic intervention in order to improve the following deficits and impairments:  Increased fascial restricitons, Pain, Decreased mobility, Increased muscle spasms, Decreased endurance, Decreased activity tolerance, Decreased range of motion, Decreased strength  Visit Diagnosis: Cervicalgia  Muscle weakness (generalized)  Other muscle spasm  Pain in left wrist  Pain in right wrist     Problem List Patient Active Problem List   Diagnosis Date Noted  . Carpal tunnel syndrome of left wrist 05/16/2017  . PTSD (post-traumatic stress disorder) 04/10/2016  . Psychosis ([place]) 04/10/2016  . Anxiety 04/10/2016  . Allergy 04/10/2016  . Asthma 04/10/2016  . Depression 04/10/2016    Earlie Counts, PT 09/17/17 10:16 AM   [facility] [address] [place], [place], [address] Phone: [phone]   Fax:  [phone]  Name: Lacey French MRN: [id number] Date of Birth:  Jul 13, 1965

## 2017-09-19 ENCOUNTER — Encounter: Payer: Self-pay | Admitting: Physical Therapy

## 2017-09-19 ENCOUNTER — Ambulatory Visit: Payer: Medicare Other | Admitting: Physical Therapy

## 2017-09-19 DIAGNOSIS — M62838 Other muscle spasm: Secondary | ICD-10-CM

## 2017-09-19 DIAGNOSIS — M542 Cervicalgia: Secondary | ICD-10-CM

## 2017-09-19 DIAGNOSIS — M6281 Muscle weakness (generalized): Secondary | ICD-10-CM

## 2017-09-19 DIAGNOSIS — M25531 Pain in right wrist: Secondary | ICD-10-CM

## 2017-09-19 DIAGNOSIS — M25532 Pain in left wrist: Secondary | ICD-10-CM

## 2017-09-19 NOTE — Therapy (Signed)
[facility] [address] [place], [place], [address] Phone: [phone]   Fax:  [phone]  Physical Therapy Treatment  Patient Details  Name: Lacey French MRN: [id number] Date of Birth: [birth year] Referring Provider: Dr. Melina Schools   Encounter Date: 09/19/2017  PT End of Session - 09/19/17 1150    Visit Number  6    Date for PT Re-Evaluation  10/01/17    Authorization Type  medicare    PT Start Time  1749    PT Stop Time  1225    PT Time Calculation (min)  40 min    Activity Tolerance  Patient tolerated treatment well;Patient limited by pain;No increased pain    Behavior During Therapy  WFL for tasks assessed/performed       Past Medical History:  Diagnosis Date  . Allergy   . Anxiety   . Arthritis   . Asthma   . COPD (chronic obstructive pulmonary disease) ([place])   . Depression   . Hyperlipidemia   . PTSD (post-traumatic stress disorder)     Past Surgical History:  Procedure Laterality Date  . BREAST SURGERY     REDUCTION  . JOINT REPLACEMENT     L shoulder, L knee  . TONSILLECTOMY      There were no vitals filed for this visit.  Subjective Assessment - 09/19/17 1149    Subjective  The predisone and gabapentin is helping. My neck is cranky all the time. Sudden movements increase pain.     Patient Stated Goals  reduce pain, how ot manage pain, full motion use  of arms and increase arm strength    Currently in Pain?  Yes    Pain Score  6     Pain Location  Neck    Pain Orientation  Right;Left    Pain Descriptors / Indicators  Throbbing;Aching;Numbness    Pain Type  Chronic pain    Pain Onset  More than a month ago    Pain Frequency  Constant    Aggravating Factors   movement    Pain Relieving Factors  nothing helps the pain    Multiple Pain Sites  No                       [facility] Adult PT Treatment/Exercise - 09/19/17 0001      Modalities   Modalities  Electrical  Stimulation;Moist Heat      Moist Heat Therapy   Number Minutes Moist Heat  20 Minutes    Moist Heat Location  Cervical      Electrical Stimulation   Electrical Stimulation Location  cervical    Electrical Stimulation Action  IFC    Electrical Stimulation Parameters  to patient tolerance, 20 min    Electrical Stimulation Goals  Pain      Manual Therapy   Manual Therapy  Soft tissue mobilization    Soft tissue mobilization  temporalis; mastoid; lateral psterygoid, platysmus; SCM, scalenes, scalp, suboccipitals, cervical and upper thoracic paraspinals       Trigger Point Dry Needling - 09/19/17 1159    Consent Given?  Yes    Muscles Treated Upper Body  Oblique capitus;Suboccipitals muscle group cervical and upper thoracic multifidi    Oblique Capitus Response  Twitch response elicited;Palpable increased muscle length    SubOccipitals Response  Twitch response elicited;Palpable increased muscle length                PT Long  Term Goals - 09/19/17 1242      PT LONG TERM GOAL #3   Title  understand correct posture to reduce strain on cervical and radiculopathy     Time  4    Period  Weeks    Status  Achieved            Plan - 09/19/17 1155    Clinical Impression Statement  Patient has many trigger points in the cervcal, neck and TMJ muscles.  Patient understands how to manage her pain.  Patient will benefit from skilled therapy to improve mobiliry and imporve quality of life.     Rehab Potential  Good    Clinical Impairments Affecting Rehab Potential  depression, anxiety, COPD, PTSD, Anxiety    PT Frequency  2x / week    PT Duration  4 weeks    PT Treatment/Interventions  Dry needling;Manual techniques;Cryotherapy;Electrical Stimulation;Moist Heat;Traction;Ultrasound;Therapeutic exercise;Therapeutic activities;Neuromuscular re-education;Patient/family education;Other (comment)    PT Next Visit Plan  neural tension stretch, soft tissue work to cervical; Cervical ROM  exercises and stretches; no traction; dry needling    PT Home Exercise Plan  Access Code: [email or web address]    Consulted and Agree with Plan of Care  Patient       Patient will benefit from skilled therapeutic intervention in order to improve the following deficits and impairments:  Increased fascial restricitons, Pain, Decreased mobility, Increased muscle spasms, Decreased endurance, Decreased activity tolerance, Decreased range of motion, Decreased strength  Visit Diagnosis: Cervicalgia  Muscle weakness (generalized)  Other muscle spasm  Pain in left wrist  Pain in right wrist     Problem List Patient Active Problem List   Diagnosis Date Noted  . Carpal tunnel syndrome of left wrist 05/16/2017  . PTSD (post-traumatic stress disorder) 04/10/2016  . Psychosis ([place]) 04/10/2016  . Anxiety 04/10/2016  . Allergy 04/10/2016  . Asthma 04/10/2016  . Depression 04/10/2016    Earlie Counts, PT 09/19/17 12:43 PM   [facility] [address], [place], [place], [address] Phone: [phone]   Fax:  [phone]  Name: Milaina Sher MRN: [id number] Date of Birth: [birth year]

## 2017-09-20 ENCOUNTER — Encounter: Payer: Medicare Other | Admitting: Physical Therapy

## 2017-09-26 ENCOUNTER — Encounter: Payer: Self-pay | Admitting: Physical Therapy

## 2017-09-26 ENCOUNTER — Ambulatory Visit: Payer: Medicare Other | Admitting: Physical Therapy

## 2017-09-26 DIAGNOSIS — M25532 Pain in left wrist: Secondary | ICD-10-CM

## 2017-09-26 DIAGNOSIS — M542 Cervicalgia: Secondary | ICD-10-CM

## 2017-09-26 DIAGNOSIS — M25531 Pain in right wrist: Secondary | ICD-10-CM

## 2017-09-26 DIAGNOSIS — M6281 Muscle weakness (generalized): Secondary | ICD-10-CM

## 2017-09-26 DIAGNOSIS — M62838 Other muscle spasm: Secondary | ICD-10-CM

## 2017-09-26 NOTE — Therapy (Signed)
[facility] [address] [place], [place], [address] Phone: [phone]   Fax:  [phone]  Physical Therapy Treatment  Patient Details  Name: Lacey French MRN: [id number] Date of Birth: Jan 18, 1966 Referring Provider: Dr. Melina Schools   Encounter Date: 09/26/2017  PT End of Session - 09/26/17 1240    Visit Number  7    Date for PT Re-Evaluation  10/01/17    Authorization Type  medicare    PT Start Time  6962    PT Stop Time  9528    PT Time Calculation (min)  50 min    Activity Tolerance  Patient tolerated treatment well;Patient limited by pain;No increased pain    Behavior During Therapy  WFL for tasks assessed/performed       Past Medical History:  Diagnosis Date  . Allergy   . Anxiety   . Arthritis   . Asthma   . COPD (chronic obstructive pulmonary disease) ([place])   . Depression   . Hyperlipidemia   . PTSD (post-traumatic stress disorder)     Past Surgical History:  Procedure Laterality Date  . BREAST SURGERY     REDUCTION  . JOINT REPLACEMENT     L shoulder, L knee  . TONSILLECTOMY      There were no vitals filed for this visit.  Subjective Assessment - 09/26/17 1148    Subjective  Pt reports that she is having a bad day today. She woke up with high levels of pain. Still having the numbness and tingling in her hands and feels like she can't lift a jar very easily.     Patient Stated Goals  reduce pain, how ot manage pain, full motion use  of arms and increase arm strength    Currently in Pain?  Yes    Pain Score  7     Pain Location  Neck    Pain Orientation  Right;Left    Pain Descriptors / Indicators  Aching;Throbbing    Pain Type  Chronic pain    Pain Onset  More than a month ago    Pain Frequency  Constant    Aggravating Factors   movement, anything    Pain Relieving Factors  nothing helps long term, sometimes e-stim helps                       [facility] Adult PT  Treatment/Exercise - 09/26/17 0001      Moist Heat Therapy   Number Minutes Moist Heat  15 Minutes    Moist Heat Location  Cervical      Electrical Stimulation   Electrical Stimulation Location  cervical    Electrical Stimulation Action  IFC    Electrical Stimulation Parameters  to pt tolerance x15 min    Electrical Stimulation Goals  Pain      Manual Therapy   Soft tissue mobilization  STM middle/upper traps, cervical/thoracic paraspinals, SCM; TPR Lt levator scap              PT Education - 09/26/17 1238    Education Details  combining progressive muscle relaxation with treatment sessions    Person(s) Educated  Patient    Methods  Explanation    Comprehension  Verbalized understanding          PT Long Term Goals - 09/19/17 1242      PT LONG TERM GOAL #3   Title  understand correct posture to reduce strain on cervical  and radiculopathy     Time  4    Period  Weeks    Status  Achieved            Plan - 09/26/17 1242    Clinical Impression Statement  Pt continues to have reports of high pain levels Lt>Rt. She feels some relief for a couple of hours following her session, but this tends to tense back up by the end of the day. Continued with soft tissue techniques to decrease muscle spasm throughout the neck and scapula region. Therapist discussed incorporating relaxation/meditation practice after her sessions to further promote muscle relaxation. Pt was agreeable to this.    Rehab Potential  Good    Clinical Impairments Affecting Rehab Potential  depression, anxiety, COPD, PTSD, Anxiety    PT Frequency  2x / week    PT Duration  4 weeks    PT Treatment/Interventions  Dry needling;Manual techniques;Cryotherapy;Electrical Stimulation;Moist Heat;Traction;Ultrasound;Therapeutic exercise;Therapeutic activities;Neuromuscular re-education;Patient/family education;Other (comment)    PT Next Visit Plan  scap strengthening, soft tissue work to cervical; Cervical ROM  exercises and stretches; no traction; dry needling    PT Home Exercise Plan  Access Code: [email or web address]    Consulted and Agree with Plan of Care  Patient       Patient will benefit from skilled therapeutic intervention in order to improve the following deficits and impairments:  Increased fascial restricitons, Pain, Decreased mobility, Increased muscle spasms, Decreased endurance, Decreased activity tolerance, Decreased range of motion, Decreased strength  Visit Diagnosis: Cervicalgia  Muscle weakness (generalized)  Other muscle spasm  Pain in left wrist  Pain in right wrist     Problem List Patient Active Problem List   Diagnosis Date Noted  . Carpal tunnel syndrome of left wrist 05/16/2017  . PTSD (post-traumatic stress disorder) 04/10/2016  . Psychosis ([place]) 04/10/2016  . Anxiety 04/10/2016  . Allergy 04/10/2016  . Asthma 04/10/2016  . Depression 04/10/2016   12:46 PM,09/26/17 Sherol Dade PT, DPT [place] [facility] [address] [place], [place], [address] Phone: [phone]   Fax:  [phone]  Name: Lacey French MRN: [id number] Date of Birth: April 17, 1965

## 2017-09-28 ENCOUNTER — Ambulatory Visit: Payer: Medicare Other | Admitting: Physical Therapy

## 2017-09-28 DIAGNOSIS — M542 Cervicalgia: Secondary | ICD-10-CM | POA: Diagnosis not present

## 2017-09-28 DIAGNOSIS — M62838 Other muscle spasm: Secondary | ICD-10-CM

## 2017-09-28 DIAGNOSIS — M25532 Pain in left wrist: Secondary | ICD-10-CM

## 2017-09-28 DIAGNOSIS — M25531 Pain in right wrist: Secondary | ICD-10-CM

## 2017-09-28 DIAGNOSIS — M6281 Muscle weakness (generalized): Secondary | ICD-10-CM

## 2017-09-28 NOTE — Patient Instructions (Signed)
Access Code: [email or web address]   URL: [email or web address]  Date: 09/28/2017  Prepared by: Elly Modena   Exercises  Supine Shoulder External Rotation with Resistance - 10 reps - 3 sets - 1x daily - 7x weekly  Supine Shoulder Horizontal Abduction with Resistance - 10 reps - 3 sets - 1x daily - 7x weekly  Seated Shoulder Diagonal with Resistance - 10 reps - 3 sets - 1x daily - 7x weekly    10:39 AM,09/28/17 Sherol Dade PT, DPT [place] [facility] [place]

## 2017-09-28 NOTE — Therapy (Signed)
[facility] [address] [place], [place], [address] Phone: [phone]   Fax:  [phone]  Physical Therapy Treatment  Patient Details  Name: Lacey French MRN: [id number] Date of Birth: Sep 30, 1965 Referring Provider: Dr. Melina Schools   Encounter Date: 09/28/2017  PT End of Session - 09/28/17 1103    Visit Number  8    Date for PT Re-Evaluation  10/01/17    Authorization Type  medicare    PT Start Time  1018    PT Stop Time  1100    PT Time Calculation (min)  42 min    Activity Tolerance  Patient tolerated treatment well;Patient limited by pain;No increased pain    Behavior During Therapy  WFL for tasks assessed/performed       Past Medical History:  Diagnosis Date  . Allergy   . Anxiety   . Arthritis   . Asthma   . COPD (chronic obstructive pulmonary disease) ([place])   . Depression   . Hyperlipidemia   . PTSD (post-traumatic stress disorder)     Past Surgical History:  Procedure Laterality Date  . BREAST SURGERY     REDUCTION  . JOINT REPLACEMENT     L shoulder, L knee  . TONSILLECTOMY      There were no vitals filed for this visit.  Subjective Assessment - 09/28/17 1020    Subjective  Pt reports that she got a massage yesterday and she is still sore, but this helped overall.     Patient Stated Goals  reduce pain, how ot manage pain, full motion use  of arms and increase arm strength    Currently in Pain?  Yes    Pain Score  6     Pain Location  Neck    Pain Orientation  Right;Left    Pain Descriptors / Indicators  Aching;Throbbing    Pain Type  Chronic pain    Pain Onset  More than a month ago    Pain Frequency  Constant    Aggravating Factors   unsure    Pain Relieving Factors  unsure     Multiple Pain Sites  No              [facility] Adult PT Treatment/Exercise - 09/28/17 0001      Neck Exercises: Supine   Upper Extremity D2  Flexion;15 reps;Limitations    UE D2 Limitations  Lt  and Rt with red TB    Other Supine Exercise  B shoulder ER with elbows by side using red TB x10 reps; B horizontal abduction using red TB x10 reps       Neck Exercises: Sidelying   Other Sidelying Exercise  thoracic rotation stretch x10 reps each direction      Manual Therapy   Joint Mobilization  thoracic rotation MWM x10 reps each direction; mid thoracic extension mobilization with movement x10 reps     Soft tissue mobilization  TPR Lt cervical paraspinals             PT Education - 09/28/17 1103    Education Details  technique with therex; HEP provided    Person(s) Educated  Patient    Methods  Explanation;Verbal cues;Demonstration;Handout    Comprehension  Verbalized understanding;Returned demonstration          PT Long Term Goals - 09/19/17 1242      PT LONG TERM GOAL #3   Title  understand correct posture to reduce strain on cervical and  radiculopathy     Time  4    Period  Weeks    Status  Achieved            Plan - 09/28/17 1104    Clinical Impression Statement  Pt continues to report soreness along the cervical region. Therapist palpated trigger points in the cervical paraspinals and completed trigger point release to decrease spasm. Introduced scapula strengthening exercises as well as thoracic mobility exercise and manual treatment to reinforce this. Pt demonstrated good understanding with HEP updates made this session, and no increase in pain was reported.     Rehab Potential  Good    Clinical Impairments Affecting Rehab Potential  depression, anxiety, COPD, PTSD, Anxiety    PT Frequency  2x / week    PT Duration  4 weeks    PT Treatment/Interventions  Dry needling;Manual techniques;Cryotherapy;Electrical Stimulation;Moist Heat;Traction;Ultrasound;Therapeutic exercise;Therapeutic activities;Neuromuscular re-education;Patient/family education;Other (comment)    PT Next Visit Plan  scap strengthening (progress increased sets/reps before resistance), DN  cervical paraspinal; thoracic mobilization; no traction    PT Home Exercise Plan  Access Code: [email or web address]    Consulted and Agree with Plan of Care  Patient       Patient will benefit from skilled therapeutic intervention in order to improve the following deficits and impairments:  Increased fascial restricitons, Pain, Decreased mobility, Increased muscle spasms, Decreased endurance, Decreased activity tolerance, Decreased range of motion, Decreased strength  Visit Diagnosis: Cervicalgia  Muscle weakness (generalized)  Other muscle spasm  Pain in left wrist  Pain in right wrist     Problem List Patient Active Problem List   Diagnosis Date Noted  . Carpal tunnel syndrome of left wrist 05/16/2017  . PTSD (post-traumatic stress disorder) 04/10/2016  . Psychosis ([place]) 04/10/2016  . Anxiety 04/10/2016  . Allergy 04/10/2016  . Asthma 04/10/2016  . Depression 04/10/2016    11:50 AM,09/28/17 Sherol Dade PT, DPT [place] [facility] [address] [place], [place], [address] Phone: [phone]   Fax:  [phone]  Name: Lacey French MRN: [id number] Date of Birth: [birth year]

## 2017-10-01 ENCOUNTER — Encounter: Payer: Medicare Other | Admitting: Physical Therapy

## 2017-10-03 ENCOUNTER — Encounter: Payer: Self-pay | Admitting: Physical Therapy

## 2017-10-03 ENCOUNTER — Ambulatory Visit: Payer: Medicare Other | Admitting: Physical Therapy

## 2017-10-03 DIAGNOSIS — M25531 Pain in right wrist: Secondary | ICD-10-CM

## 2017-10-03 DIAGNOSIS — M62838 Other muscle spasm: Secondary | ICD-10-CM

## 2017-10-03 DIAGNOSIS — M542 Cervicalgia: Secondary | ICD-10-CM

## 2017-10-03 DIAGNOSIS — M6281 Muscle weakness (generalized): Secondary | ICD-10-CM

## 2017-10-03 DIAGNOSIS — M25532 Pain in left wrist: Secondary | ICD-10-CM

## 2017-10-03 NOTE — Therapy (Signed)
[facility] [address] [place], [place], [address] Phone: [phone]   Fax:  [phone]  Physical Therapy Treatment/Re-evaluation  Patient Details  Name: Lacey French MRN: [id number] Date of Birth: 03/14/1965 Referring Provider: Melina Schools, MD    Encounter Date: 10/03/2017  PT End of Session - 10/03/17 1229    Visit Number  9    Date for PT Re-Evaluation  10/01/17    Authorization Type  medicare    PT Start Time  1147    PT Stop Time  1229    PT Time Calculation (min)  42 min    Activity Tolerance  Patient tolerated treatment well;No increased pain    Behavior During Therapy  WFL for tasks assessed/performed       Past Medical History:  Diagnosis Date  . Allergy   . Anxiety   . Arthritis   . Asthma   . COPD (chronic obstructive pulmonary disease) ([place])   . Depression   . Hyperlipidemia   . PTSD (post-traumatic stress disorder)     Past Surgical History:  Procedure Laterality Date  . BREAST SURGERY     REDUCTION  . JOINT REPLACEMENT     L shoulder, L knee  . TONSILLECTOMY      There were no vitals filed for this visit.  Subjective Assessment - 10/03/17 1150    Subjective  Pt reports that her pain is the same upon arrival today. She finished her prednisone over the weekend. Her exercises are going well. She has had good results from the dry needling treatment with relief for the day, and this is something that she would like to continue. She feels that the manual work is helping some as well with improving her ROM.     Patient Stated Goals  reduce pain, how to manage pain, full motion use of arms and increase arm strength    Currently in Pain?  Yes    Pain Score  6     Pain Location  Neck    Pain Orientation  Right;Left    Pain Descriptors / Indicators  Aching;Throbbing    Pain Type  Chronic pain    Pain Onset  More than a month ago    Aggravating Factors   unsure     Pain Relieving Factors   unsure          [facility] PT Assessment - 10/03/17 0001      Assessment   Medical Diagnosis  M54.12 Radiculopathy, cervical region    Referring Provider  Melina Schools, MD     Hand Dominance  Left    Prior Therapy  yes long time ago      Precautions   Precautions  None      Restrictions   Weight Bearing Restrictions  No      Balance Screen   Has the patient fallen in the past 6 months  No    Has the patient had a decrease in activity level because of a fear of falling?   No    Is the patient reluctant to leave their home because of a fear of falling?   No      Home Film/video editor residence      Prior Function   Level of Independence  Independent    Leisure  weight training      Cognition   Overall Cognitive Status  Within Functional Limits for tasks assessed  Observation/Other Assessments   Focus on Therapeutic Outcomes (FOTO)   57% limitation       AROM   Overall AROM Comments  bilateral shoulder, elbow and wrist ROM is full    Cervical Flexion  25 pain back of neck    Cervical Extension  40 pain back of neck     Cervical - Right Side Bend  30    Cervical - Left Side Bend  20    Cervical - Right Rotation  45    Cervical - Left Rotation  45    Thoracic - Right Rotation  decreased by 50%    Thoracic - Left Rotation  decreased by 50%      PROM   Overall PROM Comments  Rt and Lt shoulder reach behind back and behind head: dysfunctional, non-painful       Strength   Right Shoulder Flexion  5/5    Right Shoulder Extension  4+/5    Right Shoulder ABduction  5/5    Right Shoulder Internal Rotation  5/5    Right Shoulder External Rotation  5/5    Left Shoulder Flexion  4/5    Left Shoulder Extension  4+/5    Left Shoulder ABduction  4/5    Left Shoulder Internal Rotation  5/5    Left Shoulder External Rotation  5/5    Right Forearm Pronation  5/5    Right Forearm Supination  5/5    Left Forearm Pronation  5/5    Left Forearm  Supination  5/5    Right Wrist Flexion  5/5    Right Wrist Extension  5/5    Left Wrist Flexion  5/5    Left Wrist Extension  4/5    Right Hand Grip (lbs)  55    Left Hand Grip (lbs)  15                           PT Education - 10/03/17 1402    Education Details  POC moving forward    Person(s) Educated  Patient    Methods  Explanation    Comprehension  Verbalized understanding       PT Short Term Goals - 10/03/17 1423      PT SHORT TERM GOAL #1   Title  Pt will demo improved thoracic active rotation to atleast 45 deg each direction which will help with carry over during rowing exercise.    Time  4    Period  Weeks    Status  New    Target Date  11/03/17        PT Long Term Goals - 10/03/17 1156      PT LONG TERM GOAL #1   Title  Independent with HEP and understand how to progress herself    Time  8    Period  Weeks    Status  Partially Met    Target Date  12/03/17      PT LONG TERM GOAL #2   Title  ability to perfrom daily tasks with moderate to minimal pain due to improved flexibility    Baseline  improved pain and ROM following sessions recently    Time  8    Period  Weeks    Status  On-going      PT LONG TERM GOAL #3   Title  understand correct posture to reduce strain on cervical and radiculopathy     Time  8      Period  Weeks    Status  Achieved      PT LONG TERM GOAL #4   Title  ability to lift a small grocery bag >/= 5 pound with >/= 40% greater ease due to improved posture and strength    Time  8    Period  Weeks    Status  On-going      PT LONG TERM GOAL #5   Title  Pt will demo improved cervical rotation active ROM to atleast 60 deg each direction which will allow her to check blind spots while driving.     Time  8    Period  Weeks    Status  New            Plan - 10/03/17 1403    Clinical Impression Statement  Pt is making progress towards her goals, demonstrating increased BUE strength, near full on the Rt, and  limited slightly in abduction and wrist extension/flexion on the Lt. Pts grip strength on the Rt is WNL, but remains limited on the Lt. Pt did not respond well to mechanical traction initially, and has purchased a home TENS unit which she has been using regularly at home. She recently began responding well to manual soft tissue and joint mobilization as well as dry needling treatment. Completed soft tissue technique this session to decrease trigger points in the Lt levator scap and cervical paraspinals. She would benefit from continue skilled PT to further address her remaining limitations in cervical ROM, periscpular strength, and facilitate return to a regular exercise routine for her to complete at home after discharge from PT.     Rehab Potential  Good    Clinical Impairments Affecting Rehab Potential  depression, anxiety, COPD, PTSD, Anxiety    PT Frequency  2x / week    PT Duration  4 weeks    PT Treatment/Interventions  Dry needling;Manual techniques;Cryotherapy;Electrical Stimulation;Moist Heat;Traction;Ultrasound;Therapeutic exercise;Therapeutic activities;Neuromuscular re-education;Patient/family education;Other (comment)    PT Next Visit Plan  scap strengthening (seated once able), DN cervical paraspinals; thoracic rotation strength; middle/low trap work; no traction    PT Home Exercise Plan  Access Code: [email or web address]    Consulted and Agree with Plan of Care  Patient       Patient will benefit from skilled therapeutic intervention in order to improve the following deficits and impairments:  Increased fascial restricitons, Pain, Decreased mobility, Increased muscle spasms, Decreased endurance, Decreased activity tolerance, Decreased range of motion, Decreased strength  Visit Diagnosis: Cervicalgia  Muscle weakness (generalized)  Other muscle spasm  Pain in left wrist  Pain in right wrist     Problem List Patient Active Problem List   Diagnosis Date Noted  . Carpal tunnel syndrome  of left wrist 05/16/2017  . PTSD (post-traumatic stress disorder) 04/10/2016  . Psychosis ([place]) 04/10/2016  . Anxiety 04/10/2016  . Allergy 04/10/2016  . Asthma 04/10/2016  . Depression 04/10/2016   2:28 PM,10/03/17 Sherol Dade PT, DPT [place] [facility] [address] [place], [place], [address] Phone: [phone]   Fax:  [phone]  Name: Lacey French MRN: [id number] Date of Birth: [birth year]

## 2017-10-05 ENCOUNTER — Ambulatory Visit: Payer: Medicare Other | Attending: Orthopedic Surgery | Admitting: Physical Therapy

## 2017-10-05 ENCOUNTER — Encounter: Payer: Self-pay | Admitting: Physical Therapy

## 2017-10-05 DIAGNOSIS — M25531 Pain in right wrist: Secondary | ICD-10-CM | POA: Insufficient documentation

## 2017-10-05 DIAGNOSIS — M542 Cervicalgia: Secondary | ICD-10-CM | POA: Insufficient documentation

## 2017-10-05 DIAGNOSIS — M6281 Muscle weakness (generalized): Secondary | ICD-10-CM | POA: Diagnosis present

## 2017-10-05 DIAGNOSIS — M25532 Pain in left wrist: Secondary | ICD-10-CM | POA: Diagnosis present

## 2017-10-05 DIAGNOSIS — M62838 Other muscle spasm: Secondary | ICD-10-CM | POA: Insufficient documentation

## 2017-10-05 NOTE — Patient Instructions (Signed)
Access Code: [email or web address]  URL: [email or web address]  Date: 10/05/2017  Prepared by: Elly Modena   Exercises  Standing Shoulder Row with Anchored Resistance - 10 reps - 3 sets - 1x daily - 7x weekly  Seated Assisted Cervical Rotation with Towel - 10 reps - 3 sets - 1x daily - 7x weekly    9:30 AM,10/05/17 Sherol Dade PT, [place] [facility] [place]

## 2017-10-05 NOTE — Therapy (Addendum)
[facility] [address], [place], [place], [address] Phone: [phone]   Fax:  [phone]  Physical Therapy Treatment/Progress note  Patient Details  Name: Lacey French MRN: [id number] Date of Birth: 12/21/1965 Referring Provider: Melina Schools, MD   Progress Note Reporting Period 10/02/17 to 10/05/17   See note below for Objective Data and Assessment of Progress/Goals.      Encounter Date: 10/05/2017  PT End of Session - 10/05/17 0930    Visit Number  10    Date for PT Re-Evaluation  12/03/17    Authorization Type  medicare    Authorization Time Period  10/02/17 to 12/03/17    Authorization - Visit Number  2    Authorization - Number of Visits  10    PT Start Time  0845    PT Stop Time  0928    PT Time Calculation (min)  43 min    Activity Tolerance  Patient tolerated treatment well;No increased pain    Behavior During Therapy  WFL for tasks assessed/performed       Past Medical History:  Diagnosis Date  . Allergy   . Anxiety   . Arthritis   . Asthma   . COPD (chronic obstructive pulmonary disease) ([place])   . Depression   . Hyperlipidemia   . PTSD (post-traumatic stress disorder)     Past Surgical History:  Procedure Laterality Date  . BREAST SURGERY     REDUCTION  . JOINT REPLACEMENT     L shoulder, L knee  . TONSILLECTOMY      There were no vitals filed for this visit.  Subjective Assessment - 10/05/17 0847    Subjective  Pt reports that she is doing well. She is still completing her exercises. A little stiff this morning.     Patient Stated Goals  reduce pain, how to manage pain, full motion use of arms and increase arm strength    Currently in Pain?  Yes    Pain Score  6     Pain Location  Neck    Pain Orientation  Right;Left    Pain Descriptors / Indicators  Aching;Throbbing    Pain Type  Chronic pain    Pain Radiating Towards  none     Pain Onset  More than a month ago    Pain  Frequency  Constant    Aggravating Factors   unsure     Pain Relieving Factors  unsure                        [facility] Adult PT Treatment/Exercise - 10/05/17 0001      Neck Exercises: Standing   Other Standing Exercises  BUE rows with blue TB x15 reps, heavy cuind to decrease upper trap shrug       Neck Exercises: Supine   Other Supine Exercise  protraction/retraction into eccentric horizontal abduction using green TB x15 reps       Manual Therapy   Joint Mobilization  Lt cervical rotation MWM x10 reps, Lt and Rt cervical lateral glides     Soft tissue mobilization  trigger point release Lt levator scap, cervical paraspinals      Neck Exercises: Stretches   Other Neck Stretches  B latissimus stretch seated with UE over foam roll with end range thoracic extension x10 reps    Other Neck Stretches  neck rotation stretch with towel x10 reps each without hold  Trigger Point Dry Needling - 10/05/17 0940    Consent Given?  Yes    Muscles Treated Upper Body  -- cervical multifidi and Lt cervical paraspinals           PT Education - 10/05/17 0930    Education Details  technique with therex    Person(s) Educated  Patient    Methods  Explanation;Demonstration;Verbal cues;Handout    Comprehension  Returned demonstration;Verbalized understanding       PT Short Term Goals - 10/03/17 1423      PT SHORT TERM GOAL #1   Title  Pt will demo improved thoracic active rotation to atleast 45 deg each direction which will help with carry over during rowing exercise.    Time  4    Period  Weeks    Status  New    Target Date  11/03/17        PT Long Term Goals - 10/03/17 1156      PT LONG TERM GOAL #1   Title  Independent with HEP and understand how to progress herself    Time  8    Period  Weeks    Status  Partially Met    Target Date  12/03/17      PT LONG TERM GOAL #2   Title  ability to perfrom daily tasks with moderate to minimal pain due to improved  flexibility    Baseline  improved pain and ROM following sessions recently    Time  8    Period  Weeks    Status  On-going      PT LONG TERM GOAL #3   Title  understand correct posture to reduce strain on cervical and radiculopathy     Time  8    Period  Weeks    Status  Achieved      PT LONG TERM GOAL #4   Title  ability to lift a small grocery bag >/= 5 pound with >/= 40% greater ease due to improved posture and strength    Time  8    Period  Weeks    Status  On-going      PT LONG TERM GOAL #5   Title  Pt will demo improved cervical rotation active ROM to atleast 60 deg each direction which will allow her to check blind spots while driving.     Time  8    Period  Weeks    Status  New            Plan - 10/05/17 0935    Clinical Impression Statement  Pt arrived without increase in pain, but still demonstrates limitations in cervical rotation Lt and Rt with heavy guarding each direction. Session focused on therex to improve scapula strength and UE flexibility as well as manual treatment and dry needling to decrease muscle spasm and joint hypomobility. Introduced cervical rotation stretch which the pt was able to complete with proper technique. Ended session with neck soreness, expected from today's exercises. Pt would continue to benefit from skilled PT to address remaining limitations in strength, cervical ROM and self-management techniques.     Rehab Potential  Good    Clinical Impairments Affecting Rehab Potential  depression, anxiety, COPD, PTSD, Anxiety    PT Frequency  2x / week    PT Duration  4 weeks    PT Treatment/Interventions  Dry needling;Manual techniques;Cryotherapy;Electrical Stimulation;Moist Heat;Traction;Ultrasound;Therapeutic exercise;Therapeutic activities;Neuromuscular re-education;Patient/family education;Other (comment)    PT Next Visit Plan  f/u on  towel stretch; scap strengthening (seated once able), thoracic rotation stretch and strength; middle/low  trap work prone rows; no traction    PT Home Exercise Plan  Access Code: [email or web address]    Consulted and Agree with Plan of Care  Patient       Patient will benefit from skilled therapeutic intervention in order to improve the following deficits and impairments:  Increased fascial restricitons, Pain, Decreased mobility, Increased muscle spasms, Decreased endurance, Decreased activity tolerance, Decreased range of motion, Decreased strength  Visit Diagnosis: Cervicalgia  Muscle weakness (generalized)  Other muscle spasm  Pain in left wrist  Pain in right wrist     Problem List Patient Active Problem List   Diagnosis Date Noted  . Carpal tunnel syndrome of left wrist 05/16/2017  . PTSD (post-traumatic stress disorder) 04/10/2016  . Psychosis ([place]) 04/10/2016  . Anxiety 04/10/2016  . Allergy 04/10/2016  . Asthma 04/10/2016  . Depression 04/10/2016   10:01 AM,10/05/17 Sherol Dade PT, DPT [place] [facility] [address] [place], [place], [address] Phone: [phone]   Fax:  [phone]  Name: Lacey French MRN: [id number] Date of Birth: 1965-06-04   *addendum to include progress note data for 10th visit/goal assessment.   10:05 AM,12/06/17 [place], [place] at [place]

## 2017-10-10 ENCOUNTER — Ambulatory Visit: Payer: Medicare Other | Admitting: Physical Therapy

## 2017-10-10 DIAGNOSIS — M542 Cervicalgia: Secondary | ICD-10-CM | POA: Diagnosis not present

## 2017-10-10 DIAGNOSIS — M25532 Pain in left wrist: Secondary | ICD-10-CM

## 2017-10-10 DIAGNOSIS — M25531 Pain in right wrist: Secondary | ICD-10-CM

## 2017-10-10 DIAGNOSIS — M62838 Other muscle spasm: Secondary | ICD-10-CM

## 2017-10-10 DIAGNOSIS — M6281 Muscle weakness (generalized): Secondary | ICD-10-CM

## 2017-10-10 NOTE — Therapy (Signed)
[facility] [address], [place], [place], [address] Phone: [phone]   Fax:  [phone]  Physical Therapy Treatment  Patient Details  Name: Lacey French MRN: [id number] Date of Birth: 1966/02/27 Referring Provider: Melina Schools, MD    Encounter Date: 10/10/2017  PT End of Session - 10/10/17 1236    Visit Number  11    Date for PT Re-Evaluation  12/03/17    Authorization Type  medicare    Authorization Time Period  10/02/17 to 12/03/17    Authorization - Visit Number  3    Authorization - Number of Visits  10    PT Start Time  1101    PT Stop Time  0093    PT Time Calculation (min)  43 min    Activity Tolerance  Patient tolerated treatment well;No increased pain    Behavior During Therapy  WFL for tasks assessed/performed       Past Medical History:  Diagnosis Date  . Allergy   . Anxiety   . Arthritis   . Asthma   . COPD (chronic obstructive pulmonary disease) ([place])   . Depression   . Hyperlipidemia   . PTSD (post-traumatic stress disorder)     Past Surgical History:  Procedure Laterality Date  . BREAST SURGERY     REDUCTION  . JOINT REPLACEMENT     L shoulder, L knee  . TONSILLECTOMY      There were no vitals filed for this visit.  Subjective Assessment - 10/10/17 1103    Subjective  Pt reports that she has been completing her HEP regularly. She still wakes up with a "wicked" headache in the mornings which does not seem to resolve.     Patient Stated Goals  reduce pain, how to manage pain, full motion use of arms and increase arm strength    Currently in Pain?  Yes    Pain Score  6     Pain Location  Neck    Pain Orientation  Right;Left    Pain Descriptors / Indicators  Aching;Throbbing    Pain Type  Chronic pain    Pain Radiating Towards  none     Pain Onset  More than a month ago    Pain Frequency  Constant    Aggravating Factors   unsure     Pain Relieving Factors  unsure                         [facility] Adult PT Treatment/Exercise - 10/10/17 0001      Neck Exercises: Supine   Other Supine Exercise  protraction/retraction into eccentric horizontal abduction using blue TB x15 reps; sub occipital release with tennis balls x5 min    Other Supine Exercise  supine over foam roll scap retraction x15 reps and snow angels x10 reps       Manual Therapy   Joint Mobilization  CPA C2 to C6, grade III x2 bouts     Soft tissue mobilization  Trigger point release Lt latissimus with passive stretch x7 reps        Trigger Point Dry Needling - 10/10/17 1233    Consent Given?  Yes    Muscles Treated Upper Body  -- Lt latissimus (+) twitch response           PT Education - 10/10/17 1235    Education Details  technique with therex     Person(s) Educated  Patient  Methods  Verbal cues;Explanation    Comprehension  Verbalized understanding;Returned demonstration       PT Short Term Goals - 10/03/17 1423      PT SHORT TERM GOAL #1   Title  Pt will demo improved thoracic active rotation to atleast 45 deg each direction which will help with carry over during rowing exercise.    Time  4    Period  Weeks    Status  New    Target Date  11/03/17        PT Long Term Goals - 10/03/17 1156      PT LONG TERM GOAL #1   Title  Independent with HEP and understand how to progress herself    Time  8    Period  Weeks    Status  Partially Met    Target Date  12/03/17      PT LONG TERM GOAL #2   Title  ability to perfrom daily tasks with moderate to minimal pain due to improved flexibility    Baseline  improved pain and ROM following sessions recently    Time  8    Period  Weeks    Status  On-going      PT LONG TERM GOAL #3   Title  understand correct posture to reduce strain on cervical and radiculopathy     Time  8    Period  Weeks    Status  Achieved      PT LONG TERM GOAL #4   Title  ability to lift a small grocery bag >/= 5 pound with >/= 40%  greater ease due to improved posture and strength    Time  8    Period  Weeks    Status  On-going      PT LONG TERM GOAL #5   Title  Pt will demo improved cervical rotation active ROM to atleast 60 deg each direction which will allow her to check blind spots while driving.     Time  8    Period  Weeks    Status  New            Plan - 10/10/17 1237    Clinical Impression Statement  Pt has been completing her HEP as instructed. She still requires cuing to activate lower traps with scapular retraction and demonstrates limited end range shoulder strength secondary to lower trap weakness and latissimus tightness. Completed manual techniques to address cervical discomfort and improve lat flexibility. Pt was instructed in self sub-occipital release with noted good understanding of this. Will continue with current POC.     Rehab Potential  Good    Clinical Impairments Affecting Rehab Potential  depression, anxiety, COPD, PTSD, Anxiety    PT Frequency  2x / week    PT Duration  4 weeks    PT Treatment/Interventions  Dry needling;Manual techniques;Cryotherapy;Electrical Stimulation;Moist Heat;Traction;Ultrasound;Therapeutic exercise;Therapeutic activities;Neuromuscular re-education;Patient/family education;Other (comment)    PT Next Visit Plan  f/u on sub occipital release; scap strengthening (seated once able), thoracic rotation stretch and strength; middle/low trap work prone rows; no traction    PT Home Exercise Plan  Access Code: [email or web address]    Consulted and Agree with Plan of Care  Patient       Patient will benefit from skilled therapeutic intervention in order to improve the following deficits and impairments:  Increased fascial restricitons, Pain, Decreased mobility, Increased muscle spasms, Decreased endurance, Decreased activity tolerance, Decreased range of motion, Decreased strength  Visit Diagnosis:  Cervicalgia  Muscle weakness (generalized)  Other muscle spasm  Pain in left  wrist  Pain in right wrist     Problem List Patient Active Problem List   Diagnosis Date Noted  . Carpal tunnel syndrome of left wrist 05/16/2017  . PTSD (post-traumatic stress disorder) 04/10/2016  . Psychosis ([place]) 04/10/2016  . Anxiety 04/10/2016  . Allergy 04/10/2016  . Asthma 04/10/2016  . Depression 04/10/2016   12:43 PM,10/10/17 Sherol Dade PT, DPT [place] [facility] [address] [place], [place], [address] Phone: [phone]   Fax:  [phone]  Name: Lacey French MRN: [id number] Date of Birth: 1965/04/01

## 2017-10-11 ENCOUNTER — Ambulatory Visit: Payer: Medicare Other | Admitting: Physical Therapy

## 2017-10-11 DIAGNOSIS — M542 Cervicalgia: Secondary | ICD-10-CM

## 2017-10-11 DIAGNOSIS — M25531 Pain in right wrist: Secondary | ICD-10-CM

## 2017-10-11 DIAGNOSIS — M62838 Other muscle spasm: Secondary | ICD-10-CM

## 2017-10-11 DIAGNOSIS — M6281 Muscle weakness (generalized): Secondary | ICD-10-CM

## 2017-10-11 DIAGNOSIS — M25532 Pain in left wrist: Secondary | ICD-10-CM

## 2017-10-11 NOTE — Patient Instructions (Signed)
Access Code: [email or web address]  URL: [email or web address]  Date: 10/11/2017  Prepared by: Elly Modena   Exercises  Single-Leg Quarter Squat - 10 reps - 3 sets - 1x daily - 7x weekly  Single Leg Stance - 30 hold - 1x daily - 7x weekly  Arch Lifting - 10-15 reps - 3 sets - 5 hold - 1x daily - 7x weekly  Long Sitting Ankle Inversion with Anchored Resistance - 10-20 reps - 3 sets - 1x daily - 7x weekly  Long Sitting Ankle Eversion with Resistance - 10-20 reps - 3 sets - 1x daily - 7x weekly  Long Sitting Ankle Plantar Flexion with Resistance - 10-20 reps - 3 sets - 1x daily - 7x weekly    [facility] [address], [place], [place] [address] Phone # [phone] Fax [phone]

## 2017-10-11 NOTE — Therapy (Signed)
[facility] [address], [place], [place], [address] Phone: [phone]   Fax:  [phone]  Physical Therapy Treatment  Patient Details  Name: Lacey French MRN: [id number] Date of Birth: [birth year] Referring Provider: Melina Schools, MD    Encounter Date: 10/11/2017  PT End of Session - 10/11/17 1203    Visit Number  12    Date for PT Re-Evaluation  12/03/17    Authorization Type  medicare    Authorization Time Period  10/02/17 to 12/03/17    Authorization - Visit Number  4    Authorization - Number of Visits  10    PT Start Time  3846    PT Stop Time  1059    PT Time Calculation (min)  43 min    Activity Tolerance  Patient tolerated treatment well;No increased pain    Behavior During Therapy  WFL for tasks assessed/performed       Past Medical History:  Diagnosis Date  . Allergy   . Anxiety   . Arthritis   . Asthma   . COPD (chronic obstructive pulmonary disease) ([place])   . Depression   . Hyperlipidemia   . PTSD (post-traumatic stress disorder)     Past Surgical History:  Procedure Laterality Date  . BREAST SURGERY     REDUCTION  . JOINT REPLACEMENT     L shoulder, L knee  . TONSILLECTOMY      There were no vitals filed for this visit.  Subjective Assessment - 10/11/17 1018    Subjective  Pt reports that she is a little sore today, but did complete the sub-occipital massage this morning.     Patient Stated Goals  reduce pain, how to manage pain, full motion use of arms and increase arm strength    Currently in Pain?  Yes    Pain Score  6     Pain Location  Neck    Pain Orientation  Right;Left    Pain Descriptors / Indicators  Aching;Throbbing    Pain Type  Chronic pain    Pain Radiating Towards  none     Pain Onset  More than a month ago    Pain Frequency  Constant    Aggravating Factors   unsure    Pain Relieving Factors  unsure                        [facility] Adult PT  Treatment/Exercise - 10/11/17 0001      Self-Care   Other Self-Care Comments   walking goals 3 days a week atleast 8 laps and plan to gradually increase this       Therapeutic Activites    Therapeutic Activities  --      Neuro Re-ed    Neuro Re-ed Details   pain science education      Exercises   Exercises  Other Exercises    Other Exercises   Lt arch lift x10 reps, Lt ankle inversion/eversion with red TB x10 reps, Lt and Rt single leg squat with LE propped on bench x5 reps each, SLS on Lt x12 sec              PT Education - 10/11/17 1202    Education Details  pain science education; HEP additions to encourage total body wellness; adjustments to exercise technique to avoid compensations     Person(s) Educated  Patient    Methods  Explanation;Demonstration;Verbal cues;Handout  Comprehension  Returned demonstration;Verbalized understanding                 Plan - 10/11/17 1203    Clinical Impression Statement  Today's session focused heavily on pain science education regarding chronic pain. Therapist established short term activity goals to improve pt's positive experiences with pain and was able to educate on long term wellness after discharge from PT. Pt has been inactive over the past several months which has primarily had a negative impact on her wellbeing. Therapist also instructed her in exercises and necessary adjustments required due to previous surgeries, and limitations in joint mobility. Pt verbalized agreement with her walking program established this session and was able to demonstrate understanding of all exercises provided. Will continue next session with further pt education and instruction regarding relaxation techniques and to address UE weakness and limitations in cervical ROM.      Rehab Potential  Good    Clinical Impairments Affecting Rehab Potential  depression, anxiety, COPD, PTSD, Anxiety    PT Frequency  2x / week    PT Duration  4 weeks    PT  Treatment/Interventions  Dry needling;Manual techniques;Cryotherapy;Electrical Stimulation;Moist Heat;Traction;Ultrasound;Therapeutic exercise;Therapeutic activities;Neuromuscular re-education;Patient/family education;Other (comment)    PT Next Visit Plan  f/u on walking 8 laps, 3 days a week; scap strengthening (seated once able), thoracic rotation stretch and strength; middle/low trap work prone rows; no traction    PT Home Exercise Plan  Access Code: [email or web address]    Consulted and Agree with Plan of Care  Patient       Patient will benefit from skilled therapeutic intervention in order to improve the following deficits and impairments:  Increased fascial restricitons, Pain, Decreased mobility, Increased muscle spasms, Decreased endurance, Decreased activity tolerance, Decreased range of motion, Decreased strength  Visit Diagnosis: Cervicalgia  Muscle weakness (generalized)  Other muscle spasm  Pain in left wrist  Pain in right wrist     Problem List Patient Active Problem List   Diagnosis Date Noted  . Carpal tunnel syndrome of left wrist 05/16/2017  . PTSD (post-traumatic stress disorder) 04/10/2016  . Psychosis ([place]) 04/10/2016  . Anxiety 04/10/2016  . Allergy 04/10/2016  . Asthma 04/10/2016  . Depression 04/10/2016    12:12 PM,10/11/17 Sherol Dade PT, DPT [place] [facility] [place]  [facility] [address] [place], [place], [address] Phone: [phone]   Fax:  [phone]  Name: Lacey French MRN: [id number] Date of Birth: Jan 29, 1966

## 2017-10-15 ENCOUNTER — Ambulatory Visit: Payer: Medicare Other | Admitting: Physical Therapy

## 2017-10-15 DIAGNOSIS — M25531 Pain in right wrist: Secondary | ICD-10-CM

## 2017-10-15 DIAGNOSIS — M62838 Other muscle spasm: Secondary | ICD-10-CM

## 2017-10-15 DIAGNOSIS — M542 Cervicalgia: Secondary | ICD-10-CM

## 2017-10-15 DIAGNOSIS — M6281 Muscle weakness (generalized): Secondary | ICD-10-CM

## 2017-10-15 DIAGNOSIS — M25532 Pain in left wrist: Secondary | ICD-10-CM

## 2017-10-15 NOTE — Therapy (Signed)
[facility] [address], [place], [place], [address] Phone: [phone]   Fax:  [phone]  Physical Therapy Treatment  Patient Details  Name: Lacey French MRN: [id number] Date of Birth: 10-18-1965 Referring Provider: Melina Schools, MD    Encounter Date: 10/15/2017  PT End of Session - 10/15/17 1301    Visit Number  13    Date for PT Re-Evaluation  12/03/17    Authorization Type  medicare    Authorization Time Period  10/02/17 to 12/03/17    Authorization - Visit Number  5    Authorization - Number of Visits  10    PT Start Time  1100    PT Stop Time  6789    PT Time Calculation (min)  44 min    Activity Tolerance  Patient tolerated treatment well;No increased pain    Behavior During Therapy  WFL for tasks assessed/performed       Past Medical History:  Diagnosis Date  . Allergy   . Anxiety   . Arthritis   . Asthma   . COPD (chronic obstructive pulmonary disease) ([place])   . Depression   . Hyperlipidemia   . PTSD (post-traumatic stress disorder)     Past Surgical History:  Procedure Laterality Date  . BREAST SURGERY     REDUCTION  . JOINT REPLACEMENT     L shoulder, L knee  . TONSILLECTOMY      There were no vitals filed for this visit.  Subjective Assessment - 10/15/17 1103    Subjective  Pt reports that the walking is going good, just boring. Pt reports that the neck area is still not happy.     Patient Stated Goals  reduce pain, how to manage pain, full motion use of arms and increase arm strength    Currently in Pain?  Yes    Pain Score  6     Pain Location  Neck    Pain Orientation  Right;Left    Pain Descriptors / Indicators  Aching;Throbbing    Pain Type  Chronic pain    Pain Radiating Towards  none     Pain Onset  More than a month ago    Pain Frequency  Constant    Aggravating Factors   unsure    Pain Relieving Factors  unsure                       [facility] Adult PT  Treatment/Exercise - 10/15/17 0001      Neck Exercises: Supine   Shoulder Flexion  Both;5 reps    Shoulder Flexion Limitations  x10 sec hold each    Other Supine Exercise  towel assisted neck rotation x10 reps each side, visual imagery x10 reps each       Manual Therapy   Manual therapy comments  passive cervical rotation with pt breathing cues x10 reps each with heavy guarding     Joint Mobilization  C1/C2 rotation mobilization Grade II-III x2 bouts Lt and Rt     Soft tissue mobilization  STM cervical parapsinals, sub occipital release; trigger point release Rt and Lt latissimus with shoulder ER        Trigger Point Dry Needling - 10/15/17 1306    Consent Given?  Yes    SubOccipitals Response  Twitch response elicited;Palpable increased muscle length   Lt; Lt cervical paraspinals           PT Education - 10/15/17 1249  Education Details  visual imagery to decrease guarding; technique with therex     Person(s) Educated  Patient    Methods  Explanation;Verbal cues;Handout    Comprehension  Verbalized understanding;Returned demonstration       PT Short Term Goals - 10/15/17 1305      PT SHORT TERM GOAL #1   Title  Pt will demo improved thoracic active rotation to atleast 45 deg each direction which will help with carry over during rowing exercise.    Time  4    Period  Weeks    Status  On-going        PT Long Term Goals - 10/15/17 1305      PT LONG TERM GOAL #1   Title  Independent with HEP and understand how to progress herself    Time  8    Period  Weeks    Status  Achieved      PT LONG TERM GOAL #2   Title  ability to perfrom daily tasks with moderate to minimal pain due to improved flexibility    Baseline  improved pain and ROM following sessions recently    Time  8    Period  Weeks    Status  On-going      PT LONG TERM GOAL #3   Title  understand correct posture to reduce strain on cervical and radiculopathy     Time  8    Period  Weeks    Status   Achieved      PT LONG TERM GOAL #4   Title  ability to lift a small grocery bag >/= 5 pound with >/= 40% greater ease due to improved posture and strength    Time  8    Period  Weeks    Status  On-going      PT LONG TERM GOAL #5   Title  Pt will demo improved cervical rotation active ROM to atleast 60 deg each direction which will allow her to check blind spots while driving.     Time  8    Period  Weeks    Status  New            Plan - 10/15/17 1302    Clinical Impression Statement  Pt was able to complete her short term activity goal since her last session. Therapist progressed pt's walking goal and introduced a biking goal as well for her to increase her activity throughout the week. Pt is heavily guarded during her sessions, so therapist had pt complete visual imagery prior to passive ROM and manual techniques. Pt reported 25% decrease in tightness following dry needling, but has difficulty maintaining this. Will following up on imagery techniques next session to determine it's impact on ROM.     Rehab Potential  Good    Clinical Impairments Affecting Rehab Potential  depression, anxiety, COPD, PTSD, Anxiety    PT Frequency  2x / week    PT Duration  4 weeks    PT Treatment/Interventions  Dry needling;Manual techniques;Cryotherapy;Electrical Stimulation;Moist Heat;Traction;Ultrasound;Therapeutic exercise;Therapeutic activities;Neuromuscular re-education;Patient/family education;Other (comment)    PT Next Visit Plan  f/u on walking mile and half/biking, 4 days a week; f/u on imagery techniques for ROM; scap strengthening (seated once able), thoracic rotation stretch and strength; middle/low trap work prone rows; no traction    PT Home Exercise Plan  Access Code: [email or web address]    Consulted and Agree with Plan of Care  Patient       Patient  will benefit from skilled therapeutic intervention in order to improve the following deficits and impairments:  Increased fascial restricitons,  Pain, Decreased mobility, Increased muscle spasms, Decreased endurance, Decreased activity tolerance, Decreased range of motion, Decreased strength  Visit Diagnosis: Cervicalgia  Muscle weakness (generalized)  Other muscle spasm  Pain in left wrist  Pain in right wrist     Problem List Patient Active Problem List   Diagnosis Date Noted  . Carpal tunnel syndrome of left wrist 05/16/2017  . PTSD (post-traumatic stress disorder) 04/10/2016  . Psychosis ([place]) 04/10/2016  . Anxiety 04/10/2016  . Allergy 04/10/2016  . Asthma 04/10/2016  . Depression 04/10/2016    1:12 PM,10/15/17 Sherol Dade PT, DPT [place] [facility] [address] [place], [place], [address] Phone: [phone]   Fax:  [phone]  Name: Britney Newstrom MRN: [id number] Date of Birth: 05-Mar-1966

## 2017-10-17 ENCOUNTER — Encounter: Payer: Self-pay | Admitting: Physical Therapy

## 2017-10-17 ENCOUNTER — Ambulatory Visit: Payer: Medicare Other | Admitting: Physical Therapy

## 2017-10-17 DIAGNOSIS — M62838 Other muscle spasm: Secondary | ICD-10-CM

## 2017-10-17 DIAGNOSIS — M542 Cervicalgia: Secondary | ICD-10-CM | POA: Diagnosis not present

## 2017-10-17 DIAGNOSIS — M25532 Pain in left wrist: Secondary | ICD-10-CM

## 2017-10-17 DIAGNOSIS — M25531 Pain in right wrist: Secondary | ICD-10-CM

## 2017-10-17 DIAGNOSIS — M6281 Muscle weakness (generalized): Secondary | ICD-10-CM

## 2017-10-17 NOTE — Therapy (Signed)
[facility] [address], [place], [place], [address] Phone: [phone]   Fax:  [phone]  Physical Therapy Treatment  Patient Details  Name: Lacey French MRN: [id number] Date of Birth: 1965-06-21 Referring Provider: Melina Schools, MD    Encounter Date: 10/17/2017  PT End of Session - 10/17/17 1157    Visit Number  14    Date for PT Re-Evaluation  12/03/17    Authorization Type  medicare    Authorization Time Period  10/02/17 to 12/03/17    Authorization - Visit Number  6    Authorization - Number of Visits  10    PT Start Time  0258    PT Stop Time  1059    PT Time Calculation (min)  43 min    Activity Tolerance  Patient tolerated treatment well;No increased pain    Behavior During Therapy  WFL for tasks assessed/performed       Past Medical History:  Diagnosis Date  . Allergy   . Anxiety   . Arthritis   . Asthma   . COPD (chronic obstructive pulmonary disease) ([place])   . Depression   . Hyperlipidemia   . PTSD (post-traumatic stress disorder)     Past Surgical History:  Procedure Laterality Date  . BREAST SURGERY     REDUCTION  . JOINT REPLACEMENT     L shoulder, L knee  . TONSILLECTOMY      There were no vitals filed for this visit.  Subjective Assessment - 10/17/17 1017    Subjective  Pt reports that she is doing good. She tried to do some of the imagery but still had issues with actively moving her neck. It still wakes her up most days of the week.     Patient Stated Goals  reduce pain, how to manage pain, full motion use of arms and increase arm strength    Currently in Pain?  Other (Comment)   no change    Pain Onset  More than a month ago                       [facility] Adult PT Treatment/Exercise - 10/17/17 0001      Exercises   Exercises  Neck      Neck Exercises: Machines for Strengthening   UBE (Upper Arm Bike)  x2 min forward/backward L1      Neck Exercises: Seated    Other Seated Exercise  pulley UE flexion with same side cervical rotation x2 min       Neck Exercises: Prone   Other Prone Exercise  [place] 6's x10 reps each UE (active assistance with Y external rotation and shoulder extension)       Manual Therapy   Soft tissue mobilization  STM Lt and Rt SCM, Lt and Rt cervical paraspinals, sub occipitals              PT Education - 10/17/17 1156    Education Details  technique with therex     Person(s) Educated  Patient    Methods  Explanation;Verbal cues    Comprehension  Verbalized understanding;Returned demonstration       PT Short Term Goals - 10/15/17 1305      PT SHORT TERM GOAL #1   Title  Pt will demo improved thoracic active rotation to atleast 45 deg each direction which will help with carry over during rowing exercise.    Time  4  Period  Weeks    Status  On-going        PT Long Term Goals - 10/15/17 1305      PT LONG TERM GOAL #1   Title  Independent with HEP and understand how to progress herself    Time  8    Period  Weeks    Status  Achieved      PT LONG TERM GOAL #2   Title  ability to perfrom daily tasks with moderate to minimal pain due to improved flexibility    Baseline  improved pain and ROM following sessions recently    Time  8    Period  Weeks    Status  On-going      PT LONG TERM GOAL #3   Title  understand correct posture to reduce strain on cervical and radiculopathy     Time  8    Period  Weeks    Status  Achieved      PT LONG TERM GOAL #4   Title  ability to lift a small grocery bag >/= 5 pound with >/= 40% greater ease due to improved posture and strength    Time  8    Period  Weeks    Status  On-going      PT LONG TERM GOAL #5   Title  Pt will demo improved cervical rotation active ROM to atleast 60 deg each direction which will allow her to check blind spots while driving.     Time  8    Period  Weeks    Status  New            Plan - 10/17/17 1158    Clinical  Impression Statement  Pt was able to complete UBE this session without reports of difficulty. Introduced UE pulley with cervical rotation and pt was able to reach up to 45 deg of active rotation each direction this session. Pt had difficulty with prone rotator cuff strengthening, primarily with lower trap activation. Ended session with soft tissue mobilization to decrease cervical tension and muscle spasm. Made some adjustments to pt's sub occipital release technique and she verbalized understanding at this time.     Rehab Potential  Good    Clinical Impairments Affecting Rehab Potential  depression, anxiety, COPD, PTSD, Anxiety    PT Frequency  2x / week    PT Duration  4 weeks    PT Treatment/Interventions  Dry needling;Manual techniques;Cryotherapy;Electrical Stimulation;Moist Heat;Traction;Ultrasound;Therapeutic exercise;Therapeutic activities;Neuromuscular re-education;Patient/family education;Other (comment)    PT Next Visit Plan  f/u on sub-occipital release supine; low trap and scap strengthening (seated once able), thoracic rotation stretch and strength; middle/low trap work prone rows; no traction    PT Home Exercise Plan  Access Code: [email or web address]    Consulted and Agree with Plan of Care  Patient       Patient will benefit from skilled therapeutic intervention in order to improve the following deficits and impairments:  Increased fascial restricitons, Pain, Decreased mobility, Increased muscle spasms, Decreased endurance, Decreased activity tolerance, Decreased range of motion, Decreased strength  Visit Diagnosis: Muscle weakness (generalized)  Cervicalgia  Other muscle spasm  Pain in left wrist  Pain in right wrist     Problem List Patient Active Problem List   Diagnosis Date Noted  . Carpal tunnel syndrome of left wrist 05/16/2017  . PTSD (post-traumatic stress disorder) 04/10/2016  . Psychosis ([place]) 04/10/2016  . Anxiety 04/10/2016  . Allergy 04/10/2016  . Asthma  04/10/2016  .  Depression 04/10/2016    12:03 PM,10/17/17 Sherol Dade PT, DPT [place] [facility] [address] [place], [place], [address] Phone: [phone]   Fax:  [phone]  Name: Tamalyn Wadsworth MRN: [id number] Date of Birth: 01/29/1966

## 2017-10-23 ENCOUNTER — Ambulatory Visit: Payer: Medicare Other | Admitting: Physical Therapy

## 2017-10-23 ENCOUNTER — Encounter: Payer: Self-pay | Admitting: Physical Therapy

## 2017-10-23 DIAGNOSIS — M25531 Pain in right wrist: Secondary | ICD-10-CM

## 2017-10-23 DIAGNOSIS — M542 Cervicalgia: Secondary | ICD-10-CM | POA: Diagnosis not present

## 2017-10-23 DIAGNOSIS — M6281 Muscle weakness (generalized): Secondary | ICD-10-CM

## 2017-10-23 DIAGNOSIS — M25532 Pain in left wrist: Secondary | ICD-10-CM

## 2017-10-23 DIAGNOSIS — M62838 Other muscle spasm: Secondary | ICD-10-CM

## 2017-10-23 NOTE — Therapy (Signed)
[facility] [address] [place], [place], [address] Phone: [phone]   Fax:  [phone]  Physical Therapy Treatment  Patient Details  Name: Statia Burdick MRN: [id number] Date of Birth: 1965-04-21 Referring Provider: Melina Schools, MD    Encounter Date: 10/23/2017  PT End of Session - 10/23/17 1107    Visit Number  15    Date for PT Re-Evaluation  12/03/17    Authorization Type  medicare    Authorization Time Period  10/02/17 to 12/03/17    Authorization - Visit Number  7    Authorization - Number of Visits  10    PT Start Time  1016    PT Stop Time  1100    PT Time Calculation (min)  44 min    Activity Tolerance  Patient tolerated treatment well;No increased pain    Behavior During Therapy  WFL for tasks assessed/performed       Past Medical History:  Diagnosis Date  . Allergy   . Anxiety   . Arthritis   . Asthma   . COPD (chronic obstructive pulmonary disease) ([place])   . Depression   . Hyperlipidemia   . PTSD (post-traumatic stress disorder)     Past Surgical History:  Procedure Laterality Date  . BREAST SURGERY     REDUCTION  . JOINT REPLACEMENT     L shoulder, L knee  . TONSILLECTOMY      There were no vitals filed for this visit.  Subjective Assessment - 10/23/17 1018    Subjective  Pt reports that she went out on Saturday and Sunday and he neck was really sore. She says it stayed about the same on Monday. She tried the tennis ball on her back and this was ok, but still painful.     Patient Stated Goals  reduce pain, how to manage pain, full motion use of arms and increase arm strength    Currently in Pain?  Yes    Pain Score  6     Pain Location  Neck    Pain Orientation  Right;Left;Upper    Pain Descriptors / Indicators  Aching;Throbbing    Pain Type  Chronic pain    Pain Radiating Towards  none     Pain Onset  More than a month ago    Pain Frequency  Constant    Aggravating Factors    unsure     Pain Relieving Factors  unsure                        [facility] Adult PT Treatment/Exercise - 10/23/17 0001      Exercises   Exercises  Neck      Neck Exercises: Machines for Strengthening   UBE (Upper Arm Bike)  x2 min forward/backward L1      Neck Exercises: Standing   Other Standing Exercises  cervical flexion isometric 10x5 sec hold     Other Standing Exercises  Rt shoulder ER with red TB x10 reps at 0 and 90 deg abduction      Neck Exercises: Seated   Other Seated Exercise  pulley UE flexion with same side cervical rotation x2 min     Other Seated Exercise  thoracic rotation stretch 10x5 sec each direction       Neck Exercises: Supine   Upper Extremity D2  10 reps;Flexion    Theraband Level (UE D2)  Level 3 (Green)  Manual Therapy   Soft tissue mobilization  trigger point release Lt and Rt levator scap during cervical rotation             PT Education - 10/23/17 1107    Education Details  technique with therex; updated HEP    Person(s) Educated  Patient    Methods  Explanation;Handout;Verbal cues;Tactile cues    Comprehension  Verbalized understanding;Returned demonstration       PT Short Term Goals - 10/15/17 1305      PT SHORT TERM GOAL #1   Title  Pt will demo improved thoracic active rotation to atleast 45 deg each direction which will help with carry over during rowing exercise.    Time  4    Period  Weeks    Status  On-going        PT Long Term Goals - 10/15/17 1305      PT LONG TERM GOAL #1   Title  Independent with HEP and understand how to progress herself    Time  8    Period  Weeks    Status  Achieved      PT LONG TERM GOAL #2   Title  ability to perfrom daily tasks with moderate to minimal pain due to improved flexibility    Baseline  improved pain and ROM following sessions recently    Time  8    Period  Weeks    Status  On-going      PT LONG TERM GOAL #3   Title  understand correct posture to reduce  strain on cervical and radiculopathy     Time  8    Period  Weeks    Status  Achieved      PT LONG TERM GOAL #4   Title  ability to lift a small grocery bag >/= 5 pound with >/= 40% greater ease due to improved posture and strength    Time  8    Period  Weeks    Status  On-going      PT LONG TERM GOAL #5   Title  Pt will demo improved cervical rotation active ROM to atleast 60 deg each direction which will allow her to check blind spots while driving.     Time  8    Period  Weeks    Status  New            Plan - 10/23/17 1108    Clinical Impression Statement  Pt continues to have feelings of tightness along her upper cervical spine. Pt has weakness of the posterior shoulder on Lt and Rt during attempts to complete diagonal strengthening exercises. Therapist broke down this exercise into its components of external rotation and rows and pt had increased shoulder activation with this. Pt's cervical pain was reproduced with palpation of the levator scap and she reported decrease in tension following trigger point release to the Lt and Rt levator scap. Will continue with current POC and further address this in future sessions.     Rehab Potential  Good    Clinical Impairments Affecting Rehab Potential  depression, anxiety, COPD, PTSD, Anxiety    PT Frequency  2x / week    PT Duration  4 weeks    PT Treatment/Interventions  Dry needling;Manual techniques;Cryotherapy;Electrical Stimulation;Moist Heat;Traction;Ultrasound;Therapeutic exercise;Therapeutic activities;Neuromuscular re-education;Patient/family education;Other (comment)    PT Next Visit Plan  levator scap work; tricep work; shoulder ER, thoracic rotation stretch and strength; middle/low trap work prone rows; no traction  PT Home Exercise Plan  Access Code: [email or web address]    Consulted and Agree with Plan of Care  Patient       Patient will benefit from skilled therapeutic intervention in order to improve the following deficits and  impairments:  Increased fascial restricitons, Pain, Decreased mobility, Increased muscle spasms, Decreased endurance, Decreased activity tolerance, Decreased range of motion, Decreased strength  Visit Diagnosis: Muscle weakness (generalized)  Cervicalgia  Other muscle spasm  Pain in left wrist  Pain in right wrist     Problem List Patient Active Problem List   Diagnosis Date Noted  . Carpal tunnel syndrome of left wrist 05/16/2017  . PTSD (post-traumatic stress disorder) 04/10/2016  . Psychosis ([place]) 04/10/2016  . Anxiety 04/10/2016  . Allergy 04/10/2016  . Asthma 04/10/2016  . Depression 04/10/2016   11:46 AM,10/23/17 Sherol Dade PT, DPT [place] [facility] [address] [place], [place], [address] Phone: [phone]   Fax:  [phone]  Name: Braniya Farrugia MRN: [id number] Date of Birth: 12-15-1965

## 2017-10-25 ENCOUNTER — Ambulatory Visit: Payer: Medicare Other | Admitting: Physical Therapy

## 2017-10-25 ENCOUNTER — Encounter: Payer: Self-pay | Admitting: Physical Therapy

## 2017-10-25 DIAGNOSIS — M25532 Pain in left wrist: Secondary | ICD-10-CM

## 2017-10-25 DIAGNOSIS — M62838 Other muscle spasm: Secondary | ICD-10-CM

## 2017-10-25 DIAGNOSIS — M542 Cervicalgia: Secondary | ICD-10-CM | POA: Diagnosis not present

## 2017-10-25 DIAGNOSIS — M25531 Pain in right wrist: Secondary | ICD-10-CM

## 2017-10-25 DIAGNOSIS — M6281 Muscle weakness (generalized): Secondary | ICD-10-CM

## 2017-10-25 NOTE — Therapy (Signed)
[facility] [address] [place], [place], [address] Phone: [phone]   Fax:  [phone]  Physical Therapy Treatment  Patient Details  Name: Lacey French MRN: [id number] Date of Birth: 12-Jan-1966 Referring Provider: Melina Schools, MD    Encounter Date: 10/25/2017  PT End of Session - 10/25/17 1107    Visit Number  16    Date for PT Re-Evaluation  12/03/17    Authorization Type  medicare    Authorization Time Period  10/02/17 to 12/03/17    Authorization - Visit Number  8    Authorization - Number of Visits  10    PT Start Time  1324    PT Stop Time  4010    PT Time Calculation (min)  50 min    Activity Tolerance  Patient tolerated treatment well;No increased pain    Behavior During Therapy  WFL for tasks assessed/performed       Past Medical History:  Diagnosis Date  . Allergy   . Anxiety   . Arthritis   . Asthma   . COPD (chronic obstructive pulmonary disease) ([place])   . Depression   . Hyperlipidemia   . PTSD (post-traumatic stress disorder)     Past Surgical History:  Procedure Laterality Date  . BREAST SURGERY     REDUCTION  . JOINT REPLACEMENT     L shoulder, L knee  . TONSILLECTOMY      There were no vitals filed for this visit.  Subjective Assessment - 10/25/17 0927    Subjective  Pt reports that her neck is "cranky" and she still has numbness and pain in the Lt last 2 fingers and the Rt first 2 fingers.     Patient Stated Goals  reduce pain, how to manage pain, full motion use of arms and increase arm strength    Currently in Pain?  Yes   no change   Pain Onset  More than a month ago                       [facility] Adult PT Treatment/Exercise - 10/25/17 0001      Neck Exercises: Machines for Strengthening   UBE (Upper Arm Bike)  x2 min forward/backward therapist present to discuss improvements after last session      Neck Exercises: Seated   Other Seated Exercise  Lt  ulnar nerve glide x10 reps, Rt radial nerve glide x10 reps       Modalities   Modalities  Cryotherapy      Cryotherapy   Number Minutes Cryotherapy  8 Minutes    Cryotherapy Location  --   Lt tricep, Rt wrist extensor   Type of Cryotherapy  Ice pack      Manual Therapy   Soft tissue mobilization  STM Rt wrist extensors, Lt tricep        Trigger Point Dry Needling - 10/25/17 1102    Consent Given?  Yes    Muscles Treated Upper Body  --   Rt wrist extensors, supinator; Lt triceps (+) twitch           PT Education - 10/25/17 1105    Education Details  updated HEP; nerve tension and importance of improving this for activity participation    Person(s) Educated  Patient    Methods  Explanation    Comprehension  Verbalized understanding;Returned demonstration       PT Short Term Goals - 10/15/17 1305  PT SHORT TERM GOAL #1   Title  Pt will demo improved thoracic active rotation to atleast 45 deg each direction which will help with carry over during rowing exercise.    Time  4    Period  Weeks    Status  On-going        PT Long Term Goals - 10/15/17 1305      PT LONG TERM GOAL #1   Title  Independent with HEP and understand how to progress herself    Time  8    Period  Weeks    Status  Achieved      PT LONG TERM GOAL #2   Title  ability to perfrom daily tasks with moderate to minimal pain due to improved flexibility    Baseline  improved pain and ROM following sessions recently    Time  8    Period  Weeks    Status  On-going      PT LONG TERM GOAL #3   Title  understand correct posture to reduce strain on cervical and radiculopathy     Time  8    Period  Weeks    Status  Achieved      PT LONG TERM GOAL #4   Title  ability to lift a small grocery bag >/= 5 pound with >/= 40% greater ease due to improved posture and strength    Time  8    Period  Weeks    Status  On-going      PT LONG TERM GOAL #5   Title  Pt will demo improved cervical rotation  active ROM to atleast 60 deg each direction which will allow her to check blind spots while driving.     Time  8    Period  Weeks    Status  New            Plan - 10/25/17 1108    Clinical Impression Statement  Pt continues to have cervical pain and symptoms into BUEs. She does have signs of neural tension along the upper extremity and forearm specifically, so session focused on reviewing nerve flossing exercise. Pt had difficulty with the RUE, due to high levels of tension. Therapist also completed manual treatment and dry needling to the forearm and tricep with several twitch responses noted throughout. Pt did have increased soreness of the Rt wrist extensor following today's treatment and ice was applied to decrease tenderness. Will follow up on dry needling response at next visit.    Rehab Potential  Good    Clinical Impairments Affecting Rehab Potential  depression, anxiety, COPD, PTSD, Anxiety    PT Frequency  2x / week    PT Duration  4 weeks    PT Treatment/Interventions  Dry needling;Manual techniques;Cryotherapy;Electrical Stimulation;Moist Heat;Traction;Ultrasound;Therapeutic exercise;Therapeutic activities;Neuromuscular re-education;Patient/family education;Other (comment)    PT Next Visit Plan  f/u on DN to UE; levator scap work; tricep work (stretch and strength); shoulder ER, thoracic rotation stretch and strength; middle/low trap work prone rows; no traction    PT Home Exercise Plan  Access Code: [email or web address]    Consulted and Agree with Plan of Care  Patient       Patient will benefit from skilled therapeutic intervention in order to improve the following deficits and impairments:  Increased fascial restricitons, Pain, Decreased mobility, Increased muscle spasms, Decreased endurance, Decreased activity tolerance, Decreased range of motion, Decreased strength  Visit Diagnosis: Muscle weakness (generalized)  Cervicalgia  Other muscle spasm  Pain  in left wrist  Pain in  right wrist     Problem List Patient Active Problem List   Diagnosis Date Noted  . Carpal tunnel syndrome of left wrist 05/16/2017  . PTSD (post-traumatic stress disorder) 04/10/2016  . Psychosis ([place]) 04/10/2016  . Anxiety 04/10/2016  . Allergy 04/10/2016  . Asthma 04/10/2016  . Depression 04/10/2016   11:14 AM,10/25/17 Sherol Dade PT, DPT [place] [address] [place], [place], [address] Phone: [phone]   Fax:  [phone]  Name: Lacey French MRN: [id number] Date of Birth: April 08, 1965

## 2017-10-25 NOTE — Patient Instructions (Signed)
Access Code: [email or web address]  URL: [email or web address]  Date: 10/25/2017  Prepared by: Elly Modena   Exercises  . Standing Shoulder Row with Anchored Resistance - 10 reps - 3 sets - 1x daily - 7x weekly  . Isometric Cervical Flexion at Wall with Ball - 10 reps - 5 hold - 2x daily - 7x weekly  . Standing Isometric Cervical Rotation - 10 reps - 1 sets - 5 hold - 2x daily - 7x weekly  . Standing Isometric Cervical Sidebending with Manual Resistance - 10 reps - 1 sets - 2x daily - 7x weekly  . Sidelying Thoracic Rotation with Open Book - 15 reps - 1 sets - 2x daily - 7x weekly  . Seated Shoulder External Rotation with Resistance - 10 reps - 1 sets - 2x daily - 7x weekly  . Standing Shoulder External Rotation in Abduction with Anchored Resistance - 10 reps - 1 sets - 2x daily - 7x weekly  . Ulnar Nerve Flossing - 5 reps - 1x daily - 7x weekly  . Standing Radial Nerve Glide - 5 reps - 1x daily - 7x weekly   [facility] [address], [place], [place] [address] Phone # [phone] Fax [phone]

## 2017-10-30 ENCOUNTER — Ambulatory Visit: Payer: Medicare Other | Admitting: Physical Therapy

## 2017-10-30 ENCOUNTER — Encounter: Payer: Self-pay | Admitting: Physical Therapy

## 2017-10-30 DIAGNOSIS — M542 Cervicalgia: Secondary | ICD-10-CM

## 2017-10-30 DIAGNOSIS — M6281 Muscle weakness (generalized): Secondary | ICD-10-CM

## 2017-10-30 DIAGNOSIS — M25531 Pain in right wrist: Secondary | ICD-10-CM

## 2017-10-30 DIAGNOSIS — M62838 Other muscle spasm: Secondary | ICD-10-CM

## 2017-10-30 DIAGNOSIS — M25532 Pain in left wrist: Secondary | ICD-10-CM

## 2017-10-30 NOTE — Therapy (Signed)
[facility] [address] [place], [place], [address] Phone: [phone]   Fax:  [phone]  Physical Therapy Treatment  Patient Details  Name: Lacey French MRN: [id number] Date of Birth: Oct 04, 1965 Referring Provider: Melina Schools, MD    Encounter Date: 10/30/2017  PT End of Session - 10/30/17 1457    Visit Number  17    Date for PT Re-Evaluation  12/03/17    Authorization Type  medicare    Authorization Time Period  10/02/17 to 12/03/17    Authorization - Visit Number  9    Authorization - Number of Visits  10    PT Start Time  3559    PT Stop Time  7416    PT Time Calculation (min)  43 min    Activity Tolerance  Patient tolerated treatment well;No increased pain    Behavior During Therapy  WFL for tasks assessed/performed       Past Medical History:  Diagnosis Date  . Allergy   . Anxiety   . Arthritis   . Asthma   . COPD (chronic obstructive pulmonary disease) ([place])   . Depression   . Hyperlipidemia   . PTSD (post-traumatic stress disorder)     Past Surgical History:  Procedure Laterality Date  . BREAST SURGERY     REDUCTION  . JOINT REPLACEMENT     L shoulder, L knee  . TONSILLECTOMY      There were no vitals filed for this visit.  Subjective Assessment - 10/30/17 1448    Subjective  Pt reports that things are ok. She saw the neurosurgeon and the orthopedic surgeon who gave her options. She was also encouraged to have carpal tunnel release. She says her neck is really sore right now.     Patient Stated Goals  reduce pain, how to manage pain, full motion use of arms and increase arm strength    Currently in Pain?  Yes    Pain Onset  More than a month ago         [facility] PT Assessment - 10/30/17 0001      AROM   Cervical - Right Rotation  50    Cervical - Left Rotation  55                   [facility] Adult PT Treatment/Exercise - 10/30/17 0001      Exercises   Exercises  Neck       Neck Exercises: Seated   Neck Retraction  10 reps    Other Seated Exercise  pulley UE flexion with same side cervical rotation x2 min     Other Seated Exercise  Rt radial nerve glide x10 reps, Lt ulnar nerve glide x10 reps; median nerve glide of Rt hand x3 rounds       Manual Therapy   Joint Mobilization  Rt wrist radioulnar mobilization x3 bouts grade III-IV; Grade III-IV CPAs from C5 to T2 x2 bouts     Soft tissue mobilization  STM Lt and Rt tricep, Rt wrist extensor group             PT Education - 10/30/17 1457    Education Details  technique with therex     Person(s) Educated  Patient    Methods  Explanation       PT Short Term Goals - 10/15/17 1305      PT SHORT TERM GOAL #1   Title  Pt will demo improved thoracic  active rotation to atleast 45 deg each direction which will help with carry over during rowing exercise.    Time  4    Period  Weeks    Status  On-going        PT Long Term Goals - 10/30/17 1500      PT LONG TERM GOAL #1   Title  Independent with HEP and understand how to progress herself    Time  8    Period  Weeks    Status  Achieved      PT LONG TERM GOAL #2   Title  ability to perfrom daily tasks with moderate to minimal pain due to improved flexibility    Baseline  improved pain and ROM following sessions recently    Time  8    Period  Weeks    Status  On-going      PT LONG TERM GOAL #3   Title  understand correct posture to reduce strain on cervical and radiculopathy     Time  8    Period  Weeks    Status  Achieved      PT LONG TERM GOAL #4   Title  ability to lift a small grocery bag >/= 5 pound with >/= 40% greater ease due to improved posture and strength    Time  8    Period  Weeks    Status  On-going      PT LONG TERM GOAL #5   Title  Pt will demo improved cervical rotation active ROM to atleast 60 deg each direction which will allow her to check blind spots while driving.     Baseline  cervical rotation Lt 55, rotation  Rt 50    Time  8    Period  Weeks    Status  Partially Met            Plan - 10/30/17 1539    Clinical Impression Statement  Pt is making progress towards her long term ROM goal, with increase in cervical rotation by approximately 10 deg noted this session. She noted decrease in Lt elbow discomfort following dry needling last session. Continued this session with nerve flossing activity and manual techniques to address soft tissue and joint restrictions along BUEs and the cervical spine. Ended session with neck retraction which the pt was able to demonstrate good understanding of. Pt would continue to benefit from skilled PT to address remaining limitations in cervical mobility, UE strength and mechanics with activity.     Rehab Potential  Good    Clinical Impairments Affecting Rehab Potential  depression, anxiety, COPD, PTSD, Anxiety    PT Frequency  2x / week    PT Duration  4 weeks    PT Treatment/Interventions  Dry needling;Manual techniques;Cryotherapy;Electrical Stimulation;Moist Heat;Traction;Ultrasound;Therapeutic exercise;Therapeutic activities;Neuromuscular re-education;Patient/family education;Other (comment)    PT Next Visit Plan  cervical mobilization; neck retraction; tricep work (stretch and strength); thoracic rotation stretch and strength; middle/low trap work prone rows; no traction    PT Home Exercise Plan  Access Code: [email or web address]    Consulted and Agree with Plan of Care  Patient       Patient will benefit from skilled therapeutic intervention in order to improve the following deficits and impairments:  Increased fascial restricitons, Pain, Decreased mobility, Increased muscle spasms, Decreased endurance, Decreased activity tolerance, Decreased range of motion, Decreased strength  Visit Diagnosis: Muscle weakness (generalized)  Cervicalgia  Other muscle spasm  Pain in left wrist  Pain in  right wrist     Problem List Patient Active Problem List   Diagnosis  Date Noted  . Carpal tunnel syndrome of left wrist 05/16/2017  . PTSD (post-traumatic stress disorder) 04/10/2016  . Psychosis ([place]) 04/10/2016  . Anxiety 04/10/2016  . Allergy 04/10/2016  . Asthma 04/10/2016  . Depression 04/10/2016    5:10 PM,10/30/17 Sherol Dade PT, DPT [place] [facility] [address], [place], [place], [address] Phone: [phone]   Fax:  [phone]  Name: Lacey French MRN: [id number] Date of Birth: 02/17/1966

## 2017-11-01 ENCOUNTER — Encounter: Payer: PRIVATE HEALTH INSURANCE | Admitting: Physical Therapy

## 2017-11-07 ENCOUNTER — Encounter: Payer: Self-pay | Admitting: Physical Therapy

## 2017-11-07 ENCOUNTER — Ambulatory Visit: Payer: Medicare Other | Attending: Orthopedic Surgery | Admitting: Physical Therapy

## 2017-11-07 DIAGNOSIS — M62838 Other muscle spasm: Secondary | ICD-10-CM | POA: Insufficient documentation

## 2017-11-07 DIAGNOSIS — M25532 Pain in left wrist: Secondary | ICD-10-CM | POA: Diagnosis present

## 2017-11-07 DIAGNOSIS — M25531 Pain in right wrist: Secondary | ICD-10-CM | POA: Diagnosis present

## 2017-11-07 DIAGNOSIS — M6281 Muscle weakness (generalized): Secondary | ICD-10-CM

## 2017-11-07 DIAGNOSIS — M542 Cervicalgia: Secondary | ICD-10-CM | POA: Insufficient documentation

## 2017-11-07 NOTE — Therapy (Signed)
[facility] [address] [place], [place], [address] Phone: [phone]   Fax:  [phone]  Physical Therapy Treatment  Patient Details  Name: Lacey French MRN: [id number] Date of Birth: 02-29-1966 Referring Provider: Melina Schools, MD    Encounter Date: 11/07/2017  PT End of Session - 11/07/17 1026    Visit Number  18    Date for PT Re-Evaluation  12/03/17    Authorization Type  medicare    Authorization Time Period  10/02/17 to 12/03/17    Authorization - Visit Number  8    Authorization - Number of Visits  10    PT Start Time  0930    PT Stop Time  2426    PT Time Calculation (min)  45 min    Activity Tolerance  Patient tolerated treatment well;No increased pain    Behavior During Therapy  WFL for tasks assessed/performed       Past Medical History:  Diagnosis Date  . Allergy   . Anxiety   . Arthritis   . Asthma   . COPD (chronic obstructive pulmonary disease) ([place])   . Depression   . Hyperlipidemia   . PTSD (post-traumatic stress disorder)     Past Surgical History:  Procedure Laterality Date  . BREAST SURGERY     REDUCTION  . JOINT REPLACEMENT     L shoulder, L knee  . TONSILLECTOMY      There were no vitals filed for this visit.  Subjective Assessment - 11/07/17 1023    Subjective  When I turn my head I get a pressure headache in the back of the head.  This started last week. Dry needling helps. The mobilization of the neck helps.     Patient Stated Goals  reduce pain, how to manage pain, full motion use of arms and increase arm strength    Currently in Pain?  Yes    Pain Score  6     Pain Location  Neck    Pain Orientation  Right;Left;Upper    Pain Descriptors / Indicators  Aching;Throbbing    Pain Type  Chronic pain    Pain Onset  More than a month ago    Pain Frequency  Constant    Aggravating Factors   turning her head    Pain Relieving Factors  not turning head    Multiple Pain  Sites  No         [facility] PT Assessment - 11/07/17 0001      AROM   Thoracic - Right Rotation  decreased by 40%    Thoracic - Left Rotation  decreased by 50%                   [facility] Adult PT Treatment/Exercise - 11/07/17 0001      Neck Exercises: Seated   Neck Retraction  10 reps    Other Seated Exercise  pulley UE flexion with same side cervical rotation x2 min       Manual Therapy   Manual Therapy  Joint mobilization;Soft tissue mobilization    Joint Mobilization  p-A mobilization to C3-C6    Soft tissue mobilization  suboccipitals, cervical paraspinals, upper trap. levator ani to elongate tissue and reduce trigger points       Trigger Point Dry Needling - 11/07/17 1030    Consent Given?  Yes    Muscles Treated Upper Body  Suboccipitals muscle group;Oblique capitus;Upper trapezius;Levator scapulae   bil. cervical  multifidi   Upper Trapezius Response  Twitch reponse elicited;Palpable increased muscle length    Oblique Capitus Response  Twitch response elicited;Palpable increased muscle length    SubOccipitals Response  Twitch response elicited;Palpable increased muscle length    Levator Scapulae Response  Twitch response elicited;Palpable increased muscle length             PT Short Term Goals - 11/07/17 1232      PT SHORT TERM GOAL #1   Title  Pt will demo improved thoracic active rotation to atleast 45 deg each direction which will help with carry over during rowing exercise.    Time  4    Period  Weeks    Status  Achieved        PT Long Term Goals - 10/30/17 1500      PT LONG TERM GOAL #1   Title  Independent with HEP and understand how to progress herself    Time  8    Period  Weeks    Status  Achieved      PT LONG TERM GOAL #2   Title  ability to perfrom daily tasks with moderate to minimal pain due to improved flexibility    Baseline  improved pain and ROM following sessions recently    Time  8    Period  Weeks    Status  On-going       PT LONG TERM GOAL #3   Title  understand correct posture to reduce strain on cervical and radiculopathy     Time  8    Period  Weeks    Status  Achieved      PT LONG TERM GOAL #4   Title  ability to lift a small grocery bag >/= 5 pound with >/= 40% greater ease due to improved posture and strength    Time  8    Period  Weeks    Status  On-going      PT LONG TERM GOAL #5   Title  Pt will demo improved cervical rotation active ROM to atleast 60 deg each direction which will allow her to check blind spots while driving.     Baseline  cervical rotation Lt 55, rotation Rt 50    Time  8    Period  Weeks    Status  Partially Met            Plan - 11/07/17 1224    Clinical Impression Statement  Patient reports she is having pressure in her head when rotating her neck that started last week. Patient continues to have limitation in thoracic rotation with left greater than right.  Patient has palpable trigger points in the left cervical paraspinals.  Patient keeps her cervical and upper thoracic area is limited due to pain and disc in the lower and upper thoracic area. Patient will benefit from skilled PT to address reamining limitations in cervical mobility, UE strength and mechanics and activity.     Rehab Potential  Good    Clinical Impairments Affecting Rehab Potential  depression, anxiety, COPD, PTSD, Anxiety    PT Frequency  2x / week    PT Duration  4 weeks    PT Treatment/Interventions  Dry needling;Manual techniques;Cryotherapy;Electrical Stimulation;Moist Heat;Traction;Ultrasound;Therapeutic exercise;Therapeutic activities;Neuromuscular re-education;Patient/family education;Other (comment)    PT Next Visit Plan  Vertebral artery test; cervical mobilization; neck retraction; tricep work (stretch and strength); thoracic rotation stretch and strength; middle/low trap work prone rows; no traction    PT  Home Exercise Plan  Access Code: [email or web address]    Recommended Other Services  MD signed  the initial note and renewal note    Consulted and Agree with Plan of Care  Patient       Patient will benefit from skilled therapeutic intervention in order to improve the following deficits and impairments:  Increased fascial restricitons, Pain, Decreased mobility, Increased muscle spasms, Decreased endurance, Decreased activity tolerance, Decreased range of motion, Decreased strength  Visit Diagnosis: Muscle weakness (generalized)  Cervicalgia  Other muscle spasm  Pain in left wrist  Pain in right wrist     Problem List Patient Active Problem List   Diagnosis Date Noted  . Carpal tunnel syndrome of left wrist 05/16/2017  . PTSD (post-traumatic stress disorder) 04/10/2016  . Psychosis ([place]) 04/10/2016  . Anxiety 04/10/2016  . Allergy 04/10/2016  . Asthma 04/10/2016  . Depression 04/10/2016    Earlie Counts, PT 11/07/17 12:33 PM   [facility] [address] [place], [place], [address] Phone: [phone]   Fax:  [phone]  Name: Lacey French MRN: [id number] Date of Birth: [birth year]

## 2017-11-08 ENCOUNTER — Ambulatory Visit: Payer: Medicare Other | Admitting: Physical Therapy

## 2017-11-08 ENCOUNTER — Encounter: Payer: Self-pay | Admitting: Physical Therapy

## 2017-11-08 DIAGNOSIS — M62838 Other muscle spasm: Secondary | ICD-10-CM

## 2017-11-08 DIAGNOSIS — M25531 Pain in right wrist: Secondary | ICD-10-CM

## 2017-11-08 DIAGNOSIS — M542 Cervicalgia: Secondary | ICD-10-CM

## 2017-11-08 DIAGNOSIS — M6281 Muscle weakness (generalized): Secondary | ICD-10-CM | POA: Diagnosis not present

## 2017-11-08 DIAGNOSIS — M25532 Pain in left wrist: Secondary | ICD-10-CM

## 2017-11-08 NOTE — Therapy (Addendum)
[facility] [address], [place], [place], [address] Phone: [phone]   Fax:  [phone]  Physical Therapy Treatment/Progress note  Patient Details  Name: Lacey French MRN: [id number] Date of Birth: 11/05/1965 Referring Provider: Melina Schools, MD    Progress Note Reporting Period 10/10/17 to 11/08/17  See note below for Objective Data and Assessment of Progress/Goals.       Encounter Date: 11/08/2017  PT End of Session - 11/08/17 1225    Visit Number  19    Date for PT Re-Evaluation  12/03/17    Authorization Type  medicare    Authorization Time Period  10/02/17 to 12/03/17    Authorization - Visit Number  9    Authorization - Number of Visits  10    PT Start Time  1101    PT Stop Time  6283    PT Time Calculation (min)  43 min    Activity Tolerance  Patient tolerated treatment well;No increased pain    Behavior During Therapy  WFL for tasks assessed/performed       Past Medical History:  Diagnosis Date  . Allergy   . Anxiety   . Arthritis   . Asthma   . COPD (chronic obstructive pulmonary disease) ([place])   . Depression   . Hyperlipidemia   . PTSD (post-traumatic stress disorder)     Past Surgical History:  Procedure Laterality Date  . BREAST SURGERY     REDUCTION  . JOINT REPLACEMENT     L shoulder, L knee  . TONSILLECTOMY      There were no vitals filed for this visit.  Subjective Assessment - 11/08/17 1109    Subjective  Things are going well. No change in pain today, but after the needling things were good for a little bit.     Patient Stated Goals  reduce pain, how to manage pain, full motion use of arms and increase arm strength    Currently in Pain?  --   no change in pain   Pain Onset  More than a month ago                       [facility] Adult PT Treatment/Exercise - 11/08/17 0001      Exercises   Exercises  Shoulder      Neck Exercises: Supine   Other Supine  Exercise  Lt tricep extension stretch with 3# dumbbell x10 reps       Shoulder Exercises: Supine   Flexion  Both;10 reps    Theraband Level (Shoulder Flexion)  Level 1 (Yellow)    Flexion Limitations  TB around wrist for horizontal abduction restriction       Shoulder Exercises: Seated   Other Seated Exercises  B hammer curls with yellow TB around wrist for activation of ERs x15 reps     Other Seated Exercises  B shoulder flexion to 90 deg with 3 way reach x5 reps each UE      Shoulder Exercises: ROM/Strengthening   Ball on Wall  LUE at 90 deg elevation x15 reps clockwise/counterclockwise      Manual Therapy   Joint Mobilization  Lt shoulder flexion MWM x10 reps with pulley; Lt GH inferior mobs grade III x2 bouts     Soft tissue mobilization  STM Lt tricep             PT Education - 11/08/17 1222    Education Details  technique  with therex; noted compensations with Lt shoulder activity due to weakness and limited stability    Person(s) Educated  Patient    Methods  Explanation;Verbal cues;Demonstration    Comprehension  Verbalized understanding;Returned demonstration       PT Short Term Goals - 11/07/17 1232      PT SHORT TERM GOAL #1   Title  Pt will demo improved thoracic active rotation to atleast 45 deg each direction which will help with carry over during rowing exercise.    Time  4    Period  Weeks    Status  Achieved        PT Long Term Goals - 10/30/17 1500      PT LONG TERM GOAL #1   Title  Independent with HEP and understand how to progress herself    Time  8    Period  Weeks    Status  Achieved      PT LONG TERM GOAL #2   Title  ability to perfrom daily tasks with moderate to minimal pain due to improved flexibility    Baseline  improved pain and ROM following sessions recently    Time  8    Period  Weeks    Status  On-going      PT LONG TERM GOAL #3   Title  understand correct posture to reduce strain on cervical and radiculopathy     Time  8     Period  Weeks    Status  Achieved      PT LONG TERM GOAL #4   Title  ability to lift a small grocery bag >/= 5 pound with >/= 40% greater ease due to improved posture and strength    Time  8    Period  Weeks    Status  On-going      PT LONG TERM GOAL #5   Title  Pt will demo improved cervical rotation active ROM to atleast 60 deg each direction which will allow her to check blind spots while driving.     Baseline  cervical rotation Lt 55, rotation Rt 50    Time  8    Period  Weeks    Status  Partially Met            Plan - 11/08/17 1244    Clinical Impression Statement  Pt continues to have posture compensations with Lt shoulder pain and neck pain during UE activity during her session and at home. Focused on improving joint mobility as well as introducing therex to increase posterior shoulder strength and scapular/glenohumeral stability primarily below 90 deg of elevation. Pt requires cuing to improve scapula position during ball on wall activity but was able to complete a majority of today's exercises without increase in neck pain. Will continue with current POC to improve shoulder/neck ROM, strength and activity tolerance.     Rehab Potential  Good    Clinical Impairments Affecting Rehab Potential  depression, anxiety, COPD, PTSD, Anxiety    PT Frequency  2x / week    PT Duration  4 weeks    PT Treatment/Interventions  Dry needling;Manual techniques;Cryotherapy;Electrical Stimulation;Moist Heat;Traction;Ultrasound;Therapeutic exercise;Therapeutic activities;Neuromuscular re-education;Patient/family education;Other (comment)    PT Next Visit Plan  Focus on shoulder stability (ball on wall, shoulder IR/ER strength, scap strength emphasis on avoiding shoulder shrug); thoracic mobility; neck retraction    PT Home Exercise Plan  Access Code: [email or web address]    Consulted and Agree with Plan of Care  Patient  Patient will benefit from skilled therapeutic intervention in order to  improve the following deficits and impairments:  Increased fascial restricitons, Pain, Decreased mobility, Increased muscle spasms, Decreased endurance, Decreased activity tolerance, Decreased range of motion, Decreased strength  Visit Diagnosis: Muscle weakness (generalized)  Cervicalgia  Other muscle spasm  Pain in left wrist  Pain in right wrist     Problem List Patient Active Problem List   Diagnosis Date Noted  . Carpal tunnel syndrome of left wrist 05/16/2017  . PTSD (post-traumatic stress disorder) 04/10/2016  . Psychosis ([place]) 04/10/2016  . Anxiety 04/10/2016  . Allergy 04/10/2016  . Asthma 04/10/2016  . Depression 04/10/2016   1:06 PM,11/08/17 Sherol Dade PT, DPT [place] [facility] [address], [place], [place], [address] Phone: [phone]   Fax:  [phone]  Name: Chelise Hanger MRN: [id number] Date of Birth: 09/04/1965  *addendum to include progress note date   11:23 AM,11/15/17 Sherol Dade PT, [place] [facility] [place]

## 2017-11-12 ENCOUNTER — Encounter: Payer: Self-pay | Admitting: Physical Therapy

## 2017-11-12 ENCOUNTER — Ambulatory Visit: Payer: Medicare Other | Admitting: Physical Therapy

## 2017-11-12 DIAGNOSIS — M62838 Other muscle spasm: Secondary | ICD-10-CM

## 2017-11-12 DIAGNOSIS — M25531 Pain in right wrist: Secondary | ICD-10-CM

## 2017-11-12 DIAGNOSIS — M25532 Pain in left wrist: Secondary | ICD-10-CM

## 2017-11-12 DIAGNOSIS — M542 Cervicalgia: Secondary | ICD-10-CM

## 2017-11-12 DIAGNOSIS — M6281 Muscle weakness (generalized): Secondary | ICD-10-CM

## 2017-11-12 NOTE — Therapy (Signed)
[facility] [address], [place], [place], [address] Phone: [phone]   Fax:  [phone]  Physical Therapy Treatment  Patient Details  Name: Lacey French MRN: [id number] Date of Birth: 1965-09-11 Referring Provider: Melina Schools, MD    Encounter Date: 11/12/2017  PT End of Session - 11/12/17 1149    Visit Number  20    Date for PT Re-Evaluation  12/03/17    Authorization Type  medicare    Authorization Time Period  10/02/17 to 12/03/17    Authorization - Visit Number  10    Authorization - Number of Visits  10    PT Start Time  7903    PT Stop Time  1240    PT Time Calculation (min)  52 min    Activity Tolerance  Patient tolerated treatment well;No increased pain    Behavior During Therapy  WFL for tasks assessed/performed       Past Medical History:  Diagnosis Date  . Allergy   . Anxiety   . Arthritis   . Asthma   . COPD (chronic obstructive pulmonary disease) ([place])   . Depression   . Hyperlipidemia   . PTSD (post-traumatic stress disorder)     Past Surgical History:  Procedure Laterality Date  . BREAST SURGERY     REDUCTION  . JOINT REPLACEMENT     L shoulder, L knee  . TONSILLECTOMY      There were no vitals filed for this visit.  Subjective Assessment - 11/12/17 1153    Subjective  I feel really tight along my neck and upper back today, like more tight than I ususally am. I have my usual soreness in my neck. I feel like someone hit me in the back of my head with a bat.     Currently in Pain?  Yes    Pain Score  7     Pain Location  Neck    Pain Descriptors / Indicators  Sore    Multiple Pain Sites  No                       [facility] Adult PT Treatment/Exercise - 11/12/17 0001      Neck Exercises: Supine   Other Supine Exercise  Supine on soft roll vertically, decompression and deep breathing: Cervical release on foam roll , VC to rotate slowly and just move the skin gently.        Shoulder Exercises: Seated   Other Seated Exercises  B hammer curls with yellow TB around wrist for activation of ERs x15 reps     Other Seated Exercises  Bil 3 way shoulder raise 1# 10 x each way      Shoulder Exercises: Standing   Other Standing Exercises  green soft ball close chain rolls & circles on wall Bil    VC, TC at scapula/lats     Cryotherapy   Number Minutes Cryotherapy  8 Minutes    Cryotherapy Location  Cervical    Type of Cryotherapy  Ice pack   post session              PT Short Term Goals - 11/07/17 1232      PT SHORT TERM GOAL #1   Title  Pt will demo improved thoracic active rotation to atleast 45 deg each direction which will help with carry over during rowing exercise.    Time  4    Period  Weeks  Status  Achieved        PT Long Term Goals - 10/30/17 1500      PT LONG TERM GOAL #1   Title  Independent with HEP and understand how to progress herself    Time  8    Period  Weeks    Status  Achieved      PT LONG TERM GOAL #2   Title  ability to perfrom daily tasks with moderate to minimal pain due to improved flexibility    Baseline  improved pain and ROM following sessions recently    Time  8    Period  Weeks    Status  On-going      PT LONG TERM GOAL #3   Title  understand correct posture to reduce strain on cervical and radiculopathy     Time  8    Period  Weeks    Status  Achieved      PT LONG TERM GOAL #4   Title  ability to lift a small grocery bag >/= 5 pound with >/= 40% greater ease due to improved posture and strength    Time  8    Period  Weeks    Status  On-going      PT LONG TERM GOAL #5   Title  Pt will demo improved cervical rotation active ROM to atleast 60 deg each direction which will allow her to check blind spots while driving.     Baseline  cervical rotation Lt 55, rotation Rt 50    Time  8    Period  Weeks    Status  Partially Met            Plan - 11/12/17 1225    Clinical Impression  Statement  Pt presents today with an elevated "feeling" of extreme tightness in her neck and upper back, typical pain levels. Treatment toggled between soft tissue release techniques with the soft roll ( pt has one at home ) and scapular /shoulder strengthening exercises. Pt required more facilitation at her RT scap during exercises than the LT.     Rehab Potential  Good    Clinical Impairments Affecting Rehab Potential  depression, anxiety, COPD, PTSD, Anxiety    PT Frequency  2x / week    PT Duration  4 weeks    PT Treatment/Interventions  Dry needling;Manual techniques;Cryotherapy;Electrical Stimulation;Moist Heat;Traction;Ultrasound;Therapeutic exercise;Therapeutic activities;Neuromuscular re-education;Patient/family education;Other (comment)    PT Next Visit Plan  Focus on shoulder stability (ball on wall, shoulder IR/ER strength, scap strength emphasis on avoiding shoulder shrug); thoracic mobility; neck retraction    PT Home Exercise Plan  Access Code: [email or web address]    Consulted and Agree with Plan of Care  Patient       Patient will benefit from skilled therapeutic intervention in order to improve the following deficits and impairments:  Increased fascial restricitons, Pain, Decreased mobility, Increased muscle spasms, Decreased endurance, Decreased activity tolerance, Decreased range of motion, Decreased strength  Visit Diagnosis: Muscle weakness (generalized)  Cervicalgia  Other muscle spasm  Pain in left wrist  Pain in right wrist     Problem List Patient Active Problem List   Diagnosis Date Noted  . Carpal tunnel syndrome of left wrist 05/16/2017  . PTSD (post-traumatic stress disorder) 04/10/2016  . Psychosis ([place]) 04/10/2016  . Anxiety 04/10/2016  . Allergy 04/10/2016  . Asthma 04/10/2016  . Depression 04/10/2016    Lacey French, PTA 11/12/2017, 3:30 PM  [facility] [address] [place], [place],  [address] Phone: [phone]   Fax:  [phone]  Name: Lacey French MRN: [id number] Date of Birth: Dec 31, 1965

## 2017-11-13 ENCOUNTER — Encounter: Payer: PRIVATE HEALTH INSURANCE | Admitting: Physical Therapy

## 2017-11-15 ENCOUNTER — Encounter: Payer: Self-pay | Admitting: Physical Therapy

## 2017-11-15 ENCOUNTER — Ambulatory Visit: Payer: Medicare Other | Admitting: Physical Therapy

## 2017-11-15 DIAGNOSIS — M6281 Muscle weakness (generalized): Secondary | ICD-10-CM | POA: Diagnosis not present

## 2017-11-15 DIAGNOSIS — M25532 Pain in left wrist: Secondary | ICD-10-CM

## 2017-11-15 DIAGNOSIS — M25531 Pain in right wrist: Secondary | ICD-10-CM

## 2017-11-15 DIAGNOSIS — M542 Cervicalgia: Secondary | ICD-10-CM

## 2017-11-15 DIAGNOSIS — M62838 Other muscle spasm: Secondary | ICD-10-CM

## 2017-11-15 NOTE — Therapy (Signed)
[facility] [address], [place], [place], [address] Phone: [phone]   Fax:  [phone]  Physical Therapy Treatment  Patient Details  Name: Lacey French MRN: [id number] Date of Birth: 1965-04-30 Referring Provider: Melina Schools, MD    Encounter Date: 11/15/2017  PT End of Session - 11/15/17 1114    Visit Number  21    Date for PT Re-Evaluation  12/03/17    Authorization Type  medicare    Authorization Time Period  10/02/17 to 12/03/17    Authorization - Visit Number  2    Authorization - Number of Visits  10    PT Start Time  1016    PT Stop Time  1107    PT Time Calculation (min)  51 min    Activity Tolerance  Patient tolerated treatment well;No increased pain    Behavior During Therapy  WFL for tasks assessed/performed       Past Medical History:  Diagnosis Date  . Allergy   . Anxiety   . Arthritis   . Asthma   . COPD (chronic obstructive pulmonary disease) ([place])   . Depression   . Hyperlipidemia   . PTSD (post-traumatic stress disorder)     Past Surgical History:  Procedure Laterality Date  . BREAST SURGERY     REDUCTION  . JOINT REPLACEMENT     L shoulder, L knee  . TONSILLECTOMY      There were no vitals filed for this visit.  Subjective Assessment - 11/15/17 1020    Subjective  Pt reports that this week has been stressful and her neck/upper back is really tight.     Currently in Pain?  No/denies   no change                  UBE L2 x2 min forward/backward, PT present to discuss progress and HEP    [facility] Adult PT Treatment/Exercise - 11/15/17 0001      Exercises   Exercises  Shoulder      Shoulder Exercises: Sidelying   Other Sidelying Exercises  Lt and Rt horizontal abduction x10 reps each with heavy tactile cuing for proper technique (1# dumbbell)       Shoulder Exercises: Standing   External Rotation  20 reps;Both    Theraband Level (Shoulder External Rotation)   Level 1 (Yellow)    External Rotation Limitations  elbow by side, x10 reps at 90 deg abduction      Shoulder Exercises: ROM/Strengthening   Prot/Ret//Elev/Dep  closed scap protraction/retraction with UE at 90 deg x15 reps     Other ROM/Strengthening Exercises  shoulder 3 way slide on wall x5 reps with yellow TB around wrist      Manual Therapy   Soft tissue mobilization  subscap release on Rt, shoulder ER MET x3 reps        Cervical isometric flexion, rotation and sidebend each direction 2x5 sec each for HEP demo       PT Education - 11/15/17 1113    Education Details  technique with therex; anatomy of the back/shoulder and compensations pt is making due to weaknesses in other muscles     Person(s) Educated  Patient    Methods  Explanation;Verbal cues;Tactile cues;Handout    Comprehension  Verbalized understanding;Returned demonstration       PT Short Term Goals - 11/07/17 1232      PT SHORT TERM GOAL #1   Title  Pt will demo improved thoracic  active rotation to atleast 45 deg each direction which will help with carry over during rowing exercise.    Time  4    Period  Weeks    Status  Achieved        PT Long Term Goals - 10/30/17 1500      PT LONG TERM GOAL #1   Title  Independent with HEP and understand how to progress herself    Time  8    Period  Weeks    Status  Achieved      PT LONG TERM GOAL #2   Title  ability to perfrom daily tasks with moderate to minimal pain due to improved flexibility    Baseline  improved pain and ROM following sessions recently    Time  8    Period  Weeks    Status  On-going      PT LONG TERM GOAL #3   Title  understand correct posture to reduce strain on cervical and radiculopathy     Time  8    Period  Weeks    Status  Achieved      PT LONG TERM GOAL #4   Title  ability to lift a small grocery bag >/= 5 pound with >/= 40% greater ease due to improved posture and strength    Time  8    Period  Weeks    Status  On-going       PT LONG TERM GOAL #5   Title  Pt will demo improved cervical rotation active ROM to atleast 60 deg each direction which will allow her to check blind spots while driving.     Baseline  cervical rotation Lt 55, rotation Rt 50    Time  8    Period  Weeks    Status  Partially Met            Plan - 11/15/17 1115    Clinical Impression Statement  Pt continues to feel tightness in the neck and had a heightened level of anxiety this week due to past trauma/experiences. Session focused on updating and reviewing her HEP to ensure technique is appropriate and does not cause increase in neck pain. Pt has significant difficulty with proper activation of her low trap and posterior shoulder girdle, however today she was able to activate her lower traps in sidelying. After muscle fatigue set in, she did begin to compensate with shoulder shrug and increase in neck pain. Pt's HEP was reviewed and updates were discussed in length to ensure that she is properly performing her exercises. Pt verbalized understanding of all updates made this session.     Rehab Potential  Good    Clinical Impairments Affecting Rehab Potential  depression, anxiety, COPD, PTSD, Anxiety    PT Frequency  2x / week    PT Duration  4 weeks    PT Treatment/Interventions  Dry needling;Manual techniques;Cryotherapy;Electrical Stimulation;Moist Heat;Traction;Ultrasound;Therapeutic exercise;Therapeutic activities;Neuromuscular re-education;Patient/family education;Other (comment)    PT Next Visit Plan  Focus on sideyling abduction and W's; DN subscap, shoulder stability (ball on wall, shoulder IR/ER strength, scap strength emphasis on avoiding shoulder shrug); thoracic mobility; neck retraction    PT Home Exercise Plan  Access Code: [email or web address]    Consulted and Agree with Plan of Care  Patient       Patient will benefit from skilled therapeutic intervention in order to improve the following deficits and impairments:  Increased fascial  restricitons, Pain, Decreased mobility, Increased muscle spasms, Decreased endurance,  Decreased activity tolerance, Decreased range of motion, Decreased strength  Visit Diagnosis: Muscle weakness (generalized)  Cervicalgia  Other muscle spasm  Pain in left wrist  Pain in right wrist     Problem List Patient Active Problem List   Diagnosis Date Noted  . Carpal tunnel syndrome of left wrist 05/16/2017  . PTSD (post-traumatic stress disorder) 04/10/2016  . Psychosis ([place]) 04/10/2016  . Anxiety 04/10/2016  . Allergy 04/10/2016  . Asthma 04/10/2016  . Depression 04/10/2016    11:20 AM,11/15/17 Sherol Dade PT, DPT [place] [facility] [address], [place], [place], [address] Phone: [phone]   Fax:  [phone]  Name: Sherika Kubicki MRN: [id number] Date of Birth: [birth year]

## 2017-11-20 ENCOUNTER — Encounter: Payer: PRIVATE HEALTH INSURANCE | Admitting: Physical Therapy

## 2017-11-22 ENCOUNTER — Ambulatory Visit: Payer: Medicare Other | Admitting: Physical Therapy

## 2017-11-22 ENCOUNTER — Encounter: Payer: Self-pay | Admitting: Physical Therapy

## 2017-11-22 DIAGNOSIS — M62838 Other muscle spasm: Secondary | ICD-10-CM

## 2017-11-22 DIAGNOSIS — M6281 Muscle weakness (generalized): Secondary | ICD-10-CM

## 2017-11-22 DIAGNOSIS — M542 Cervicalgia: Secondary | ICD-10-CM

## 2017-11-22 DIAGNOSIS — M25531 Pain in right wrist: Secondary | ICD-10-CM

## 2017-11-22 DIAGNOSIS — M25532 Pain in left wrist: Secondary | ICD-10-CM

## 2017-11-22 NOTE — Therapy (Signed)
[facility] [address] [place], [place], [address] Phone: [phone]   Fax:  [phone]  Physical Therapy Treatment  Patient Details  Name: Lacey French MRN: [id number] Date of Birth: 1965/05/27 Referring Provider: Melina Schools, MD    Encounter Date: 11/22/2017  PT End of Session - 11/22/17 1619    Visit Number  22    Date for PT Re-Evaluation  12/03/17    Authorization Type  medicare    Authorization Time Period  10/02/17 to 12/03/17    Authorization - Visit Number  3    Authorization - Number of Visits  10    PT Start Time  8937    PT Stop Time  1615    PT Time Calculation (min)  45 min    Activity Tolerance  Patient tolerated treatment well;No increased pain    Behavior During Therapy  WFL for tasks assessed/performed       Past Medical History:  Diagnosis Date  . Allergy   . Anxiety   . Arthritis   . Asthma   . COPD (chronic obstructive pulmonary disease) ([place])   . Depression   . Hyperlipidemia   . PTSD (post-traumatic stress disorder)     Past Surgical History:  Procedure Laterality Date  . BREAST SURGERY     REDUCTION  . JOINT REPLACEMENT     L shoulder, L knee  . TONSILLECTOMY      There were no vitals filed for this visit.  Subjective Assessment - 11/22/17 1532    Subjective  Pt reports that she was at the beach and feels very relaxed, but her neck still feels tight.    Currently in Pain?  Yes    Pain Score  6     Pain Location  Neck    Pain Orientation  Left;Right;Upper    Pain Descriptors / Indicators  Sore    Pain Type  Chronic pain    Pain Radiating Towards  none     Pain Onset  More than a month ago    Pain Frequency  Constant    Aggravating Factors   turning her head    Pain Relieving Factors  unsure          [facility] PT Assessment - 11/22/17 0001      AROM   Cervical - Right Rotation  60                  UBE L1 x2 min forward/backward  [facility] Adult PT  Treatment/Exercise - 11/22/17 0001      Shoulder Exercises: Sidelying   Other Sidelying Exercises  Lt and Rt horizontal abduction with 1# dumbbell x15 reps       Manual Therapy   Manual therapy comments  Rt cervical rotation 60 deg end of session    Joint Mobilization  Grade IV CPAs T3 to T7 x2 bouts     Soft tissue mobilization  Lt sub scap release, STM Lt rhomboids, upper traps, thoracic paraspinals       Trigger Point Dry Needling - 11/22/17 1612    Consent Given?  Yes    Muscles Treated Upper Body  Subscapularis   T4, T5, T6 Lt multifidi (+) twitch noted   Upper Trapezius Response  Twitch reponse elicited;Palpable increased muscle length   Lt   Levator Scapulae Response  Twitch response elicited;Palpable increased muscle length   Lt   Subscapularis Response  Twitch response elicited;Palpable increased muscle length  Lt          PT Education - 11/22/17 1618    Education Details  technique with therex; risks/benefits when dry needling over the lung fields    Person(s) Educated  Patient    Methods  Explanation;Verbal cues;Tactile cues    Comprehension  Verbalized understanding;Returned demonstration       PT Short Term Goals - 11/07/17 1232      PT SHORT TERM GOAL #1   Title  Pt will demo improved thoracic active rotation to atleast 45 deg each direction which will help with carry over during rowing exercise.    Time  4    Period  Weeks    Status  Achieved        PT Long Term Goals - 10/30/17 1500      PT LONG TERM GOAL #1   Title  Independent with HEP and understand how to progress herself    Time  8    Period  Weeks    Status  Achieved      PT LONG TERM GOAL #2   Title  ability to perfrom daily tasks with moderate to minimal pain due to improved flexibility    Baseline  improved pain and ROM following sessions recently    Time  8    Period  Weeks    Status  On-going      PT LONG TERM GOAL #3   Title  understand correct posture to reduce strain on  cervical and radiculopathy     Time  8    Period  Weeks    Status  Achieved      PT LONG TERM GOAL #4   Title  ability to lift a small grocery bag >/= 5 pound with >/= 40% greater ease due to improved posture and strength    Time  8    Period  Weeks    Status  On-going      PT LONG TERM GOAL #5   Title  Pt will demo improved cervical rotation active ROM to atleast 60 deg each direction which will allow her to check blind spots while driving.     Baseline  cervical rotation Lt 55, rotation Rt 50    Time  8    Period  Weeks    Status  Partially Met            Plan - 11/22/17 1619    Clinical Impression Statement  Today's session began with exercise to improve posterior shoulder and scapular strength. Pt did require heavy cuing on the Lt with reports of increase in Lt sided neck pain and tightness with this, despite therapist cuing. Completed dry needling to the Lt scap region and thoracic multifidi, with several twitch responses noted and palpable increase in tissue relaxation. Pt's cervical rotation Rt is improved to 60 deg this session, however she is still heavily guarded with rotation Lt. Will continue with current POC to decrease soft tissue spasm, decrease guarding and improve shoulder strength/stability moving forward.     Rehab Potential  Good    Clinical Impairments Affecting Rehab Potential  depression, anxiety, COPD, PTSD, Anxiety    PT Frequency  2x / week    PT Duration  4 weeks    PT Treatment/Interventions  Dry needling;Manual techniques;Cryotherapy;Electrical Stimulation;Moist Heat;Traction;Ultrasound;Therapeutic exercise;Therapeutic activities;Neuromuscular re-education;Patient/family education;Other (comment)    PT Next Visit Plan  Focus on sideyling abduction and W's; soft tissue scap region;  shoulder stability (ball on wall, shoulder  IR/ER strength, scap strength emphasis on avoiding shoulder shrug); thoracic mobility; neck retraction    PT Home Exercise Plan   Access Code: [email or web address]    Consulted and Agree with Plan of Care  Patient       Patient will benefit from skilled therapeutic intervention in order to improve the following deficits and impairments:  Increased fascial restricitons, Pain, Decreased mobility, Increased muscle spasms, Decreased endurance, Decreased activity tolerance, Decreased range of motion, Decreased strength  Visit Diagnosis: Muscle weakness (generalized)  Cervicalgia  Other muscle spasm  Pain in left wrist  Pain in right wrist     Problem List Patient Active Problem List   Diagnosis Date Noted  . Carpal tunnel syndrome of left wrist 05/16/2017  . PTSD (post-traumatic stress disorder) 04/10/2016  . Psychosis ([place]) 04/10/2016  . Anxiety 04/10/2016  . Allergy 04/10/2016  . Asthma 04/10/2016  . Depression 04/10/2016   4:26 PM,11/22/17 Sherol Dade PT, DPT [facility] [address], [place], [place], [address] Phone: [phone]   Fax:  [phone]  Name: Lacey French MRN: [id number] Date of Birth: 1965-04-17

## 2017-11-23 ENCOUNTER — Ambulatory Visit: Payer: Medicare Other | Admitting: Physical Therapy

## 2017-11-23 DIAGNOSIS — M25531 Pain in right wrist: Secondary | ICD-10-CM

## 2017-11-23 DIAGNOSIS — M6281 Muscle weakness (generalized): Secondary | ICD-10-CM

## 2017-11-23 DIAGNOSIS — M542 Cervicalgia: Secondary | ICD-10-CM

## 2017-11-23 DIAGNOSIS — M25532 Pain in left wrist: Secondary | ICD-10-CM

## 2017-11-23 DIAGNOSIS — M62838 Other muscle spasm: Secondary | ICD-10-CM

## 2017-11-23 NOTE — Therapy (Signed)
[facility] [address] [place], [place], [address] Phone: [phone]   Fax:  [phone]  Physical Therapy Treatment  Patient Details  Name: Lacey French MRN: [id number] Date of Birth: 1965/12/31 Referring Provider: Melina Schools, MD    Encounter Date: 11/23/2017  PT End of Session - 11/23/17 1145    Visit Number  23    Date for PT Re-Evaluation  12/03/17    Authorization Type  medicare    Authorization Time Period  10/02/17 to 12/03/17    Authorization - Visit Number  4    Authorization - Number of Visits  10    PT Start Time  1100    PT Stop Time  1200    PT Time Calculation (min)  60 min    Activity Tolerance  Patient limited by pain    Behavior During Therapy  [facility] for tasks assessed/performed       Past Medical History:  Diagnosis Date  . Allergy   . Anxiety   . Arthritis   . Asthma   . COPD (chronic obstructive pulmonary disease) ([place])   . Depression   . Hyperlipidemia   . PTSD (post-traumatic stress disorder)     Past Surgical History:  Procedure Laterality Date  . BREAST SURGERY     REDUCTION  . JOINT REPLACEMENT     L shoulder, L knee  . TONSILLECTOMY      There were no vitals filed for this visit.  Subjective Assessment - 11/23/17 1103    Subjective  My muscles and occiput are very cranky after yesterday. The needling cranked me up, had to ice alot yesterday with TENS. Pt presents with complete scap elevation as shewalks into therapy.     Currently in Pain?  Yes    Pain Score  8     Pain Location  --   neck, shoulder, mid back   Pain Orientation  Right;Left;Upper;Mid    Pain Descriptors / Indicators  Tightness;Sore;Tender;[facility] Adult PT Treatment/Exercise - 11/23/17 0001      Manual Therapy   Soft tissue mobilization  Occiput, Bil cervical, bil upper back/mid back, medial scap bil   MHP concurrent to back or neck               PT Short Term Goals - 11/07/17 1232      PT SHORT TERM GOAL #1   Title  Pt will demo improved thoracic active rotation to atleast 45 deg each direction which will help with carry over during rowing exercise.    Time  4    Period  Weeks    Status  Achieved        PT Long Term Goals - 10/30/17 1500      PT LONG TERM GOAL #1   Title  Independent with HEP and understand how to progress herself    Time  8    Period  Weeks    Status  Achieved      PT LONG TERM GOAL #2   Title  ability to perfrom daily tasks with moderate to minimal pain due to improved flexibility    Baseline  improved pain and ROM following sessions recently    Time  8    Period  Weeks    Status  On-going      PT LONG TERM GOAL #  3   Title  understand correct posture to reduce strain on cervical and radiculopathy     Time  8    Period  Weeks    Status  Achieved      PT LONG TERM GOAL #4   Title  ability to lift a small grocery bag >/= 5 pound with >/= 40% greater ease due to improved posture and strength    Time  8    Period  Weeks    Status  On-going      PT LONG TERM GOAL #5   Title  Pt will demo improved cervical rotation active ROM to atleast 60 deg each direction which will allow her to check blind spots while driving.     Baseline  cervical rotation Lt 55, rotation Rt 50    Time  8    Period  Weeks    Status  Partially Met            Plan - 11/23/17 1147    Clinical Impression Statement  Pt presents to therapy in pretty significnat discomfort/soreness she reports is from th edry needling yesterday. She has been using ice since yesterday. Pt was not in any condition to exercises as she was so guarded upon the beginning of the session.  we chose to focus on soft tissue work to help decrease pain and muscle guarding. Pt was sore aong the LT upper back, and we worked on TP along the medial scap boarder RT>LT. No significant muscle spasms noted which surprised pt.     Rehab  Potential  Good    Clinical Impairments Affecting Rehab Potential  depression, anxiety, COPD, PTSD, Anxiety    PT Frequency  2x / week    PT Duration  4 weeks    PT Treatment/Interventions  Dry needling;Manual techniques;Cryotherapy;Electrical Stimulation;Moist Heat;Traction;Ultrasound;Therapeutic exercise;Therapeutic activities;Neuromuscular re-education;Patient/family education;Other (comment)    PT Next Visit Plan  Focus on sideyling abduction and W's; soft tissue scap region;  shoulder stability (ball on wall, shoulder IR/ER strength, scap strength emphasis on avoiding shoulder shrug); thoracic mobility; neck retraction    PT Home Exercise Plan  Access Code: [email or web address]    Consulted and Agree with Plan of Care  Patient       Patient will benefit from skilled therapeutic intervention in order to improve the following deficits and impairments:  Increased fascial restricitons, Pain, Decreased mobility, Increased muscle spasms, Decreased endurance, Decreased activity tolerance, Decreased range of motion, Decreased strength  Visit Diagnosis: Muscle weakness (generalized)  Cervicalgia  Other muscle spasm  Pain in left wrist  Pain in right wrist     Problem List Patient Active Problem List   Diagnosis Date Noted  . Carpal tunnel syndrome of left wrist 05/16/2017  . PTSD (post-traumatic stress disorder) 04/10/2016  . Psychosis ([place]) 04/10/2016  . Anxiety 04/10/2016  . Allergy 04/10/2016  . Asthma 04/10/2016  . Depression 04/10/2016    Chiffon Kittleson, PTA 11/23/2017, 11:51 AM  [facility] [address], [place], [place], [address] Phone: [phone]   Fax:  [phone]  Name: Janica Eldred MRN: [id number] Date of Birth: [birth year]

## 2017-11-27 ENCOUNTER — Ambulatory Visit: Payer: Medicare Other | Admitting: Physical Therapy

## 2017-11-27 DIAGNOSIS — M25531 Pain in right wrist: Secondary | ICD-10-CM

## 2017-11-27 DIAGNOSIS — M6281 Muscle weakness (generalized): Secondary | ICD-10-CM | POA: Diagnosis not present

## 2017-11-27 DIAGNOSIS — M25532 Pain in left wrist: Secondary | ICD-10-CM

## 2017-11-27 DIAGNOSIS — M542 Cervicalgia: Secondary | ICD-10-CM

## 2017-11-27 DIAGNOSIS — M62838 Other muscle spasm: Secondary | ICD-10-CM

## 2017-11-27 NOTE — Patient Instructions (Signed)
Access Code: [email or web address]  URL: [email or web address]  Date: 11/27/2017  Prepared by: Sherol Dade   Exercises  Standing Shoulder Row with Anchored Resistance - 10 reps - 3 sets - 1x daily - 7x weekly  Isometric Cervical Flexion at Wall with Ball - 10 reps - 5 hold - 2x daily - 7x weekly  Standing Isometric Cervical Rotation - 10 reps - 1 sets - 5 hold - 2x daily - 7x weekly  Standing Isometric Cervical Sidebending with Manual Resistance - 10 reps - 1 sets - 2x daily - 7x weekly  Sidelying Thoracic Rotation with Open Book - 15 reps - 1 sets - 2x daily - 7x weekly  Seated Shoulder External Rotation with Resistance - 10 reps - 1 sets - 2x daily - 7x weekly  Sidelying Shoulder Horizontal Abduction - 8 reps - 2 sets - 1x daily - 7x weekly  Cat-Camel to Child's Pose - 10 reps - 2 sets - 1x daily - 7x weekly  Seated Thoracic Lumbar Extension with Pectoralis Stretch - 10 reps - 1 sets - 1x daily - 7x weekly    [facility] [address], [place], [place] [address] Phone # [phone] Fax [phone]

## 2017-11-27 NOTE — Therapy (Signed)
[facility] [address], [place], [place], [address] Phone: [phone]   Fax:  [phone]  Physical Therapy Treatment  Patient Details  Name: Lacey French MRN: [id number] Date of Birth: 1965-07-24 Referring Provider: Melina Schools, MD    Encounter Date: 11/27/2017  PT End of Session - 11/27/17 1231    Visit Number  24    Date for PT Re-Evaluation  12/03/17    Authorization Type  medicare    Authorization Time Period  10/02/17 to 12/03/17    Authorization - Visit Number  5    Authorization - Number of Visits  10    PT Start Time  4765    PT Stop Time  1153    PT Time Calculation (min)  50 min    Activity Tolerance  Patient limited by pain    Behavior During Therapy  [facility] for tasks assessed/performed       Past Medical History:  Diagnosis Date  . Allergy   . Anxiety   . Arthritis   . Asthma   . COPD (chronic obstructive pulmonary disease) ([place])   . Depression   . Hyperlipidemia   . PTSD (post-traumatic stress disorder)     Past Surgical History:  Procedure Laterality Date  . BREAST SURGERY     REDUCTION  . JOINT REPLACEMENT     L shoulder, L knee  . TONSILLECTOMY      There were no vitals filed for this visit.  Subjective Assessment - 11/27/17 1106    Subjective  Pt reports that things are going well. She is concerned that she is not able to turn her head to the Lt. She would like to be able to do it without extreme pain.     Currently in Pain?  Yes    Pain Score  6     Pain Location  --   neck when turning Lt   Pain Orientation  Left    Pain Type  Chronic pain    Pain Radiating Towards  none     Pain Onset  More than a month ago    Pain Frequency  Constant    Aggravating Factors   turning head     Pain Relieving Factors  unsure                        [facility] Adult PT Treatment/Exercise - 11/27/17 0001      Self-Care   Other Self-Care Comments   posture with daily activity  and ways to improve awareness; POC moving forward to ensure self-management techniques are provided      Exercises   Other Exercises   quadruped cat/cow x10 reps       Neck Exercises: Seated   Other Seated Exercise  cervical extension SNAG x5 reps low, middle and upper cervical spine    Other Seated Exercise  thoracic extension over chair 4x10 reps      Shoulder Exercises: Seated   Other Seated Exercises  B scap retraction/depression x10 reps, 5 sec hold       Manual Therapy   Manual Therapy  Taping    Joint Mobilization  CPAs cervical spine x1 bout for 30 sec, grade III     Kinesiotex  Facilitate Muscle      Kinesiotix   Facilitate Muscle   2 "I" strips along low trap up to lateral shoulder  PT Education - 11/27/17 1230    Education Details  importance of finding ways to improve posture awareness when having "fight or fligth response" throughout the day; Implications and self care with kinesiotape; technique with therex    Person(s) Educated  Patient    Methods  Explanation;Verbal cues;Handout    Comprehension  Returned demonstration;Verbalized understanding       PT Short Term Goals - 11/27/17 1238      PT SHORT TERM GOAL #1   Title  Pt will demo improved thoracic active rotation to atleast 45 deg each direction which will help with carry over during rowing exercise.    Time  4    Period  Weeks    Status  Achieved        PT Long Term Goals - 11/27/17 1238      PT LONG TERM GOAL #1   Title  Independent with HEP and understand how to progress herself    Time  8    Period  Weeks    Status  Achieved      PT LONG TERM GOAL #2   Title  ability to perfrom daily tasks with moderate to minimal pain due to improved flexibility    Baseline  pt able to do what she needs to do but increase in pain is noted    Time  8    Period  Weeks    Status  On-going      PT LONG TERM GOAL #3   Title  understand correct posture to reduce strain on cervical and  radiculopathy     Baseline  intermittent cuing needed    Time  8    Period  Weeks    Status  Partially Met      PT LONG TERM GOAL #4   Title  ability to lift a small grocery bag >/= 5 pound with >/= 40% greater ease due to improved posture and strength    Time  8    Period  Weeks    Status  On-going      PT LONG TERM GOAL #5   Title  Pt will demo improved cervical rotation active ROM to atleast 60 deg each direction which will allow her to check blind spots while driving.     Baseline  cervical rotation Lt 55, rotation Rt 50    Time  8    Period  Weeks    Status  Partially Met            Plan - 11/27/17 1232    Clinical Impression Statement  Today's session focused on therex to improve neck and thoracic mobility, increase posture awareness and update pt's HEP. Therapist discussed lack of consistent progress with dry needling and other soft tissue treatments and how this impacts her posture and tension response throughout the day. Therapist was able to provide techniques for improved body awareness and applied kinesiotape to the lower trap/scap region for tactile cuing and low trap facilitation throughout the day. Pt was also educated on gentle scap/cervical exercises that she can complete with TENS unit at home. She demonstrated and verbalized understanding of this and reported no increase in pain end of session. Will plan to progress pt self-management techniques moving forward.     Rehab Potential  Good    Clinical Impairments Affecting Rehab Potential  depression, anxiety, COPD, PTSD, Anxiety    PT Frequency  2x / week    PT Duration  4 weeks  PT Treatment/Interventions  Dry needling;Manual techniques;Cryotherapy;Electrical Stimulation;Moist Heat;Traction;Ultrasound;Therapeutic exercise;Therapeutic activities;Neuromuscular re-education;Patient/family education;Other (comment)    PT Next Visit Plan  f/u on TENS and scap retraction; f/u on K-tape; f/u on HEP and exercises that  increase discomfort    PT Home Exercise Plan  Access Code: [email or web address]    Consulted and Agree with Plan of Care  Patient       Patient will benefit from skilled therapeutic intervention in order to improve the following deficits and impairments:  Increased fascial restricitons, Pain, Decreased mobility, Increased muscle spasms, Decreased endurance, Decreased activity tolerance, Decreased range of motion, Decreased strength  Visit Diagnosis: Muscle weakness (generalized)  Cervicalgia  Other muscle spasm  Pain in left wrist  Pain in right wrist     Problem List Patient Active Problem List   Diagnosis Date Noted  . Carpal tunnel syndrome of left wrist 05/16/2017  . PTSD (post-traumatic stress disorder) 04/10/2016  . Psychosis ([place]) 04/10/2016  . Anxiety 04/10/2016  . Allergy 04/10/2016  . Asthma 04/10/2016  . Depression 04/10/2016   12:43 PM,11/27/17 Sherol Dade PT, DPT [facility] [address], [place], [place], [address] Phone: [phone]   Fax:  [phone]  Name: Sharlet Notaro MRN: [id number] Date of Birth: 10-Apr-1965

## 2017-11-29 ENCOUNTER — Encounter: Payer: Self-pay | Admitting: Physical Therapy

## 2017-11-29 ENCOUNTER — Ambulatory Visit: Payer: Medicare Other | Admitting: Physical Therapy

## 2017-11-29 DIAGNOSIS — M62838 Other muscle spasm: Secondary | ICD-10-CM

## 2017-11-29 DIAGNOSIS — M6281 Muscle weakness (generalized): Secondary | ICD-10-CM

## 2017-11-29 DIAGNOSIS — M542 Cervicalgia: Secondary | ICD-10-CM

## 2017-11-29 DIAGNOSIS — M25531 Pain in right wrist: Secondary | ICD-10-CM

## 2017-11-29 DIAGNOSIS — M25532 Pain in left wrist: Secondary | ICD-10-CM

## 2017-11-29 NOTE — Therapy (Signed)
[facility] [address], [place], [place], [address] Phone: [phone]   Fax:  [phone]  Physical Therapy Treatment/Discharge  Patient Details  Name: Lacey French MRN: [id number] Date of Birth: Feb 09, 1966 Referring Provider: Melina Schools, MD    Encounter Date: 11/29/2017  PT End of Session - 11/29/17 1116    Visit Number  25    Date for PT Re-Evaluation  12/03/17    Authorization Type  medicare    Authorization Time Period  10/02/17 to 12/03/17    Authorization - Visit Number  6    Authorization - Number of Visits  10    PT Start Time  6063    PT Stop Time  1147    PT Time Calculation (min)  51 min    Activity Tolerance  Patient tolerated treatment well;No increased pain    Behavior During Therapy  WFL for tasks assessed/performed       Past Medical History:  Diagnosis Date  . Allergy   . Anxiety   . Arthritis   . Asthma   . COPD (chronic obstructive pulmonary disease) ([place])   . Depression   . Hyperlipidemia   . PTSD (post-traumatic stress disorder)     Past Surgical History:  Procedure Laterality Date  . BREAST SURGERY     REDUCTION  . JOINT REPLACEMENT     L shoulder, L knee  . TONSILLECTOMY      There were no vitals filed for this visit.  Subjective Assessment - 11/29/17 1059    Subjective  Pt reports that things are going well. She has been trying to complete her HEP that was updated last session.     Currently in Pain?  Yes   no change overall.    Pain Onset  More than a month ago         [facility] PT Assessment - 11/29/17 0001      Assessment   Medical Diagnosis  M54.12 Radiculopathy, cervical region    Referring Provider (PT)  Melina Schools, MD     Onset Date/Surgical Date  --   years ago, exacerbated several months ago   Hand Dominance  Left    Prior Therapy  yes long time ago      Precautions   Precautions  None      Restrictions   Weight Bearing Restrictions  No      Balance Screen   Has the patient fallen in the past 6 months  No    Has the patient had a decrease in activity level because of a fear of falling?   No    Is the patient reluctant to leave their home because of a fear of falling?   No      Home Film/video editor residence      Prior Function   Level of Independence  Independent    Leisure  weight training      Cognition   Overall Cognitive Status  Within Functional Limits for tasks assessed      Observation/Other Assessments   Focus on Therapeutic Outcomes (FOTO)   43% limitation       AROM   Overall AROM Comments  --    Cervical Flexion  --    Cervical Extension  40   pain back of neck    Cervical - Right Side Bend  --    Cervical - Left Side Bend  --    Cervical -  Right Rotation  30   50 deg when tracking therapist or objects during session   Cervical - Left Rotation  20   50 deg when tracking therapist or objects during session   Thoracic - Right Rotation  40    Thoracic - Left Rotation  40      PROM   Overall PROM Comments  Rt and Lt shoulder reach behind back and behind head: dysfunctional, non-painful       Strength   Right Shoulder Flexion  5/5    Right Shoulder Extension  4+/5    Right Shoulder ABduction  5/5    Right Shoulder Internal Rotation  5/5    Right Shoulder External Rotation  5/5    Left Shoulder Flexion  4/5    Left Shoulder Extension  4+/5    Left Shoulder ABduction  4/5    Left Shoulder Internal Rotation  5/5    Left Shoulder External Rotation  5/5    Right Forearm Pronation  5/5    Right Forearm Supination  5/5    Left Forearm Pronation  5/5    Left Forearm Supination  5/5    Right Wrist Flexion  5/5    Right Wrist Extension  5/5    Left Wrist Flexion  5/5    Left Wrist Extension  4/5    Right Hand Grip (lbs)  --    Left Hand Grip (lbs)  --                   [facility] Adult PT Treatment/Exercise - 11/29/17 0001      Self-Care   Other Self-Care Comments    posture and kinesiotape for tactile cue; importance of avoiding pushing too far with activity to ensure pain avoidance activity does not manifest      Exercises   Other Exercises   supine Rt hip flexor stretch x20 sec, standing quad stretch with LE on table x20 sec; standing adductor stretch x30 sec each; standing gastroc/soleus stretch 15 sec each              PT Education - 11/29/17 1308    Education Details  advanced HEP; atlering activity participation and mindset to avoid fear avoidant behavior    Person(s) Educated  Patient    Methods  Explanation;Demonstration;Verbal cues;Handout    Comprehension  Verbalized understanding;Returned demonstration       PT Short Term Goals - 11/29/17 1104      PT SHORT TERM GOAL #1   Title  Pt will demo improved thoracic active rotation to atleast 45 deg each direction which will help with carry over during rowing exercise.    Baseline  atleast 40 deg each way    Time  4    Period  Weeks    Status  Achieved        PT Long Term Goals - 11/29/17 1105      PT LONG TERM GOAL #1   Title  Independent with HEP and understand how to progress herself    Time  8    Period  Weeks    Status  Achieved      PT LONG TERM GOAL #2   Title  ability to perfrom daily tasks with moderate to minimal pain due to improved flexibility    Baseline  Pt able to do what she needs to do, but still has atleast moderate pain with this    Time  8    Period  Weeks  Status  Partially Met      PT LONG TERM GOAL #3   Title  understand correct posture to reduce strain on cervical and radiculopathy     Baseline  --    Time  8    Period  Weeks    Status  Achieved      PT LONG TERM GOAL #4   Title  ability to lift a small grocery bag >/= 5 pound with >/= 40% greater ease due to improved posture and strength    Baseline  able to lift without difficulty on each LE.     Time  8    Period  Weeks    Status  Achieved      PT LONG TERM GOAL #5   Title  Pt will  demo improved cervical rotation active ROM to atleast 60 deg each direction which will allow her to check blind spots while driving.     Baseline  cervical rotation Lt 50, rotation Rt 50 during activity in session; only able to reach 30 deg when asked to measure ROM.    Time  8    Period  Weeks    Status  Partially Met            Plan - 11/29/17 1238    Clinical Impression Statement  Pt was discharged this visit having met a majority of her goals since beginning PT. Cervical ROM is improved to 50 deg bilaterally noted during her sessions, however when measuring this pt has significant limitation to no more than 30 deg actively. PT is able to lift objects from the floor without difficulty and although she notes remaining numbness/tingling in the first and last 2 digits in either hand, she is able to complete heavy lifting (50-70lb) throughout the week without any reported difficulty. Although pt's pain rating remains the same, her affect/mood and session participation have greatly increased overall. Her remaining limitations in cervical mobility and strength are likely to continue to improve with necessary adjustments to activity participation and continued work on her HEP. Pt is agreeable with d/c at this time and plans to continue with her updated program.     Rehab Potential  Good    Clinical Impairments Affecting Rehab Potential  depression, anxiety, COPD, PTSD, Anxiety    PT Frequency  2x / week    PT Duration  4 weeks    PT Treatment/Interventions  Dry needling;Manual techniques;Cryotherapy;Electrical Stimulation;Moist Heat;Traction;Ultrasound;Therapeutic exercise;Therapeutic activities;Neuromuscular re-education;Patient/family education;Other (comment)    PT Next Visit Plan  d/c home with HEP    PT Home Exercise Plan  Access Code: [email or web address]    Consulted and Agree with Plan of Care  Patient       Patient will benefit from skilled therapeutic intervention in order to improve the  following deficits and impairments:  Increased fascial restricitons, Pain, Decreased mobility, Increased muscle spasms, Decreased endurance, Decreased activity tolerance, Decreased range of motion, Decreased strength  Visit Diagnosis: Muscle weakness (generalized)  Cervicalgia  Other muscle spasm  Pain in left wrist  Pain in right wrist     Problem List Patient Active Problem List   Diagnosis Date Noted  . Carpal tunnel syndrome of left wrist 05/16/2017  . PTSD (post-traumatic stress disorder) 04/10/2016  . Psychosis ([place]) 04/10/2016  . Anxiety 04/10/2016  . Allergy 04/10/2016  . Asthma 04/10/2016  . Depression 04/10/2016   PHYSICAL THERAPY DISCHARGE SUMMARY  Visits from Start of Care: 25  Current functional level related to goals / functional  outcomes: See above for more details    Remaining deficits: See above for more details    Education / Equipment: See above for more details   Plan: Patient agrees to discharge.  Patient goals were partially met. Patient is being discharged due to lack of progress.  ?????        1:15 PM,11/29/17 Sherol Dade PT, DPT [place] [facility] [place]  [facility] [address] [place], [place], [address] Phone: [phone]   Fax:  [phone]  Name: Kaycie Pegues MRN: [id number] Date of Birth: 1965-07-14

## 2017-11-29 NOTE — Patient Instructions (Signed)
Access Code: [email or web address]  URL: [email or web address]  Date: 11/29/2017  Prepared by: Sherol Dade   Exercises  Doorway Pec Stretch at 60 Elevation - 2-3 sets - 30 hold - 1x daily - 7x weekly  [place] on Table - 2-3 sets - 20-30 hold - 1x daily - 7x weekly  Quadriceps Stretch with Table - 2-3 reps - 20-30 hold - 1x daily - 7x weekly  Side Lunge Adductor Stretch - 2-3 reps - 20-30 hold - 1x daily - 7x weekly  Standing Gastroc Stretch - 2-3 sets - 20-30 hold - 1x daily - 7x weekly    [facility] [address] [place], [place] [address] Phone # [phone] Fax [phone]

## 2017-12-03 ENCOUNTER — Ambulatory Visit: Payer: Medicare Other | Admitting: Physical Therapy

## 2019-05-06 ENCOUNTER — Other Ambulatory Visit: Payer: Self-pay | Admitting: Family Medicine

## 2019-05-06 DIAGNOSIS — Z1231 Encounter for screening mammogram for malignant neoplasm of breast: Secondary | ICD-10-CM

## 2019-05-09 ENCOUNTER — Other Ambulatory Visit: Payer: Self-pay

## 2019-05-09 ENCOUNTER — Ambulatory Visit
Admission: RE | Admit: 2019-05-09 | Discharge: 2019-05-09 | Disposition: A | Discharge status: Home / Self Care | Payer: Medicare Other | Source: Ambulatory Visit | Attending: Family Medicine | Admitting: Family Medicine

## 2019-05-09 DIAGNOSIS — Z1231 Encounter for screening mammogram for malignant neoplasm of breast: Secondary | ICD-10-CM

## 2019-05-11 ENCOUNTER — Ambulatory Visit: Payer: Medicare Other | Attending: Internal Medicine

## 2019-05-11 DIAGNOSIS — Z23 Encounter for immunization: Secondary | ICD-10-CM

## 2019-05-11 NOTE — Progress Notes (Signed)
   Covid-19 Vaccination Clinic  Name:  Lacey French    MRN: [id number] DOB: 30-Apr-1965  05/11/2019  Ms. [name] was observed post Covid-19 immunization for 15 minutes without incident. She was provided with Vaccine Information Sheet and instruction to access the V-Safe system.   [name] was instructed to call 911 with any severe reactions post vaccine: Marland Kitchen Difficulty breathing  . Swelling of face and throat  . A fast heartbeat  . A bad rash all over body  . Dizziness and weakness   Immunizations Administered    Name Date Dose VIS Date Route   Pfizer COVID-19 Vaccine 05/11/2019 12:45 PM 0.3 mL 02/14/2019 Intramuscular   Manufacturer: [place]   Lot: [id number]   [place]: [id number]

## 2019-06-02 ENCOUNTER — Ambulatory Visit: Payer: Medicare Other | Attending: Internal Medicine

## 2019-06-02 DIAGNOSIS — Z23 Encounter for immunization: Secondary | ICD-10-CM

## 2019-06-02 NOTE — Progress Notes (Signed)
   Covid-19 Vaccination Clinic  Name:  Lacey French    MRN: [id number] DOB: December 12, 1965  06/02/2019  [name] was observed post Covid-19 immunization for 30 minutes based on pre-vaccination screening without incident. She was provided with Vaccine Information Sheet and instruction to access the V-Safe system.   [name] was instructed to call 911 with any severe reactions post vaccine: Marland Kitchen Difficulty breathing  . Swelling of face and throat  . A fast heartbeat  . A bad rash all over body  . Dizziness and weakness   Immunizations Administered    Name Date Dose VIS Date Route   Pfizer COVID-19 Vaccine 06/02/2019  2:51 PM 0.3 mL 02/14/2019 Intramuscular   Manufacturer: [place]   Lot: [id number]   [place]: [id number]

## 2019-06-11 ENCOUNTER — Ambulatory Visit: Payer: Medicare Other

## 2019-10-15 ENCOUNTER — Other Ambulatory Visit: Payer: Self-pay | Admitting: Family Medicine

## 2019-10-15 ENCOUNTER — Other Ambulatory Visit (HOSPITAL_COMMUNITY)
Admission: RE | Admit: 2019-10-15 | Discharge: 2019-10-15 | Disposition: A | Discharge status: Home / Self Care | Payer: Medicare Other | Source: Ambulatory Visit | Attending: Family Medicine | Admitting: Family Medicine

## 2019-10-15 DIAGNOSIS — Z124 Encounter for screening for malignant neoplasm of cervix: Secondary | ICD-10-CM | POA: Diagnosis present

## 2019-10-16 ENCOUNTER — Other Ambulatory Visit: Payer: Self-pay | Admitting: Family Medicine

## 2019-10-16 DIAGNOSIS — N1831 Chronic kidney disease, stage 3a: Secondary | ICD-10-CM

## 2019-10-17 LAB — CYTOLOGY - PAP
Comment: NEGATIVE
Diagnosis: NEGATIVE
High risk HPV: NEGATIVE

## 2019-10-21 ENCOUNTER — Ambulatory Visit
Admission: RE | Admit: 2019-10-21 | Discharge: 2019-10-21 | Disposition: A | Discharge status: Home / Self Care | Payer: Medicare Other | Source: Ambulatory Visit | Attending: Family Medicine | Admitting: Family Medicine

## 2019-10-21 DIAGNOSIS — N1831 Chronic kidney disease, stage 3a: Secondary | ICD-10-CM

## 2020-04-15 ENCOUNTER — Other Ambulatory Visit: Payer: Self-pay | Admitting: Family Medicine

## 2020-04-15 DIAGNOSIS — Z1231 Encounter for screening mammogram for malignant neoplasm of breast: Secondary | ICD-10-CM

## 2020-06-04 ENCOUNTER — Ambulatory Visit: Payer: Medicare Other

## 2020-06-12 ENCOUNTER — Ambulatory Visit
Admission: RE | Admit: 2020-06-12 | Discharge: 2020-06-12 | Disposition: A | Discharge status: Home / Self Care | Payer: Medicare Other | Source: Ambulatory Visit | Attending: Family Medicine | Admitting: Family Medicine

## 2020-06-12 ENCOUNTER — Other Ambulatory Visit: Payer: Self-pay

## 2020-06-12 DIAGNOSIS — Z1231 Encounter for screening mammogram for malignant neoplasm of breast: Secondary | ICD-10-CM

## 2020-10-10 ENCOUNTER — Telehealth: Payer: Medicare Other | Admitting: Physician Assistant

## 2020-10-10 DIAGNOSIS — B9689 Other specified bacterial agents as the cause of diseases classified elsewhere: Secondary | ICD-10-CM | POA: Diagnosis not present

## 2020-10-10 DIAGNOSIS — J069 Acute upper respiratory infection, unspecified: Secondary | ICD-10-CM

## 2020-10-10 MED ORDER — BENZONATATE 100 MG PO CAPS
100.0000 mg | ORAL_CAPSULE | Freq: Three times a day (TID) | ORAL | 0 refills | Status: AC | PRN
Start: 2020-10-10 — End: ?

## 2020-10-10 MED ORDER — DOXYCYCLINE HYCLATE 100 MG PO TABS: 100.0000 mg | ORAL_TABLET | Freq: Two times a day (BID) | ORAL | 0 refills | Status: AC

## 2020-10-10 NOTE — Progress Notes (Signed)
Virtual Visit Consent   Lacey French, you are scheduled for a virtual visit with a [facility] provider today.     Just as with appointments in the office, your consent must be obtained to participate.  Your consent will be active for this visit and any virtual visit you may have with one of our providers in the next 365 days.     If you have a MyChart account, a copy of this consent can be sent to you electronically.  All virtual visits are billed to your insurance company just like a traditional visit in the office.    As this is a virtual visit, video technology does not allow for your provider to perform a traditional examination.  This may limit your provider's ability to fully assess your condition.  If your provider identifies any concerns that need to be evaluated in person or the need to arrange testing (such as labs, EKG, etc.), we will make arrangements to do so.     Although advances in technology are sophisticated, we cannot ensure that it will always work on either your end or our end.  If the connection with a video visit is poor, the visit may have to be switched to a telephone visit.  With either a video or telephone visit, we are not always able to ensure that we have a secure connection.     I need to obtain your verbal consent now.   Are you willing to proceed with your visit today?    [name] has provided verbal consent on 10/10/2020 for a virtual visit (video or telephone).   [name], [place]   Date: 10/10/2020 12:05 PM   Virtual Visit via Video Note   I, [name], connected with  Alisandra Krolikowski  ([id number], 08-05-1965) on 10/10/20 at 12:00 PM EDT by a video-enabled telemedicine application and verified that I am speaking with the correct person using two identifiers.  Location: Patient: Virtual Visit Location Patient: Home Provider: Virtual Visit Location Provider: Home Office   I discussed the limitations of evaluation and management by  telemedicine and the availability of in person appointments. The patient expressed understanding and agreed to proceed.    History of Present Illness: Lacey French is a 55 y.o. who identifies as a female who was assigned female at birth, and is being seen today for a couple of weeks for URI symptoms. Notes having head congestion with sinus pressure and now frontal sinus pain. Also associated with chest congestion and cough that is now productive and associated with chest wall tenderness during coughing spells. Had some intermittent fevers initially. Took COVID tests during first few days of symptoms and again a couple of days ago which were both negative.   HPI: HPI  Problems:  Patient Active Problem List   Diagnosis Date Noted   Carpal tunnel syndrome of left wrist 05/16/2017   PTSD (post-traumatic stress disorder) 04/10/2016   Psychosis ([place]) 04/10/2016   Anxiety 04/10/2016   Allergy 04/10/2016   Asthma 04/10/2016   Depression 04/10/2016    Allergies:  Allergies  Allergen Reactions   Propofol Shortness Of Breath   Paroxetine Hcl Other (See Comments)    SUICIDAL   Prozac [Fluoxetine]    Medications:  Current Outpatient Medications:    benzonatate (TESSALON) 100 MG capsule, Take 1 capsule (100 mg total) by mouth 3 (three) times daily as needed for cough., Disp: 30 capsule, Rfl: 0   doxycycline (VIBRA-TABS) 100 MG tablet, Take 1  tablet (100 mg total) by mouth 2 (two) times daily., Disp: 14 tablet, Rfl: 0   valACYclovir (VALTREX) 1000 MG tablet, Take 1,000 mg by mouth as needed., Disp: , Rfl:    albuterol (ACCUNEB) 0.63 MG/3ML nebulizer solution, Take 1 ampule by nebulization every 6 (six) hours as needed for wheezing., Disp: , Rfl:    atorvastatin (LIPITOR) 40 MG tablet, Take 40 mg by mouth daily., Disp: , Rfl:    buPROPion (WELLBUTRIN XL) 150 MG 24 hr tablet, Take 150 mg by mouth 2 (two) times daily., Disp: , Rfl:    QUEtiapine (SEROQUEL) 200 MG tablet, Take 200 mg by mouth daily.,  Disp: , Rfl:    sertraline (ZOLOFT) 100 MG tablet, Take 100 mg by mouth 2 (two) times daily., Disp: , Rfl:    zolpidem (AMBIEN) 10 MG tablet, Take 10 mg by mouth at bedtime., Disp: , Rfl:   Observations/Objective: Patient is well-developed, well-nourished in no acute distress.  Resting comfortably at home.  Head is normocephalic, atraumatic.  No labored breathing. Speech is clear and coherent with logical content.  Patient is alert and oriented at baseline.   Assessment and Plan: 1. Bacterial URI - doxycycline (VIBRA-TABS) 100 MG tablet; Take 1 tablet (100 mg total) by mouth 2 (two) times daily.  Dispense: 14 tablet; Refill: 0 - benzonatate (TESSALON) 100 MG capsule; Take 1 capsule (100 mg total) by mouth 3 (three) times daily as needed for cough.  Dispense: 30 capsule; Refill: 0 Rx doxycycline.  Increase fluids.  Rest.  Saline nasal spray.  Probiotic.  Mucinex as directed.  Humidifier in bedroom. Tessalon per orders. Discussed that if symptoms are not resolving, anything worsens or new symptoms she will need in person evaluation with her PCP or at [facility]; ER for severe symptoms. She voiced understanding.    Follow Up Instructions: I discussed the assessment and treatment plan with the patient. The patient was provided an opportunity to ask questions and all were answered. The patient agreed with the plan and demonstrated an understanding of the instructions.  A copy of instructions were sent to the patient via MyChart.  The patient was advised to call back or seek an in-person evaluation if the symptoms worsen or if the condition fails to improve as anticipated.  Time:  I spent 13 minutes with the patient via telehealth technology discussing the above problems/concerns.    [name], PA-C

## 2020-10-10 NOTE — Patient Instructions (Signed)
Lacey French, thank you for joining [name], PA-C for today's virtual visit.  While this provider is not your primary care provider (PCP), if your PCP is located in our provider database this encounter information will be shared with them immediately following your visit.  Consent: (Patient) Lacey French provided verbal consent for this virtual visit at the beginning of the encounter.  Current Medications:  Current Outpatient Medications:    albuterol (ACCUNEB) 0.63 MG/3ML nebulizer solution, Take 1 ampule by nebulization every 6 (six) hours as needed for wheezing., Disp: , Rfl:    atorvastatin (LIPITOR) 40 MG tablet, Take 40 mg by mouth daily., Disp: , Rfl:    buPROPion (WELLBUTRIN XL) 150 MG 24 hr tablet, Take 150 mg by mouth 2 (two) times daily., Disp: , Rfl:    gabapentin (NEURONTIN) 300 MG capsule, Take 300 mg by mouth 3 (three) times daily., Disp: , Rfl:    pravastatin (PRAVACHOL) 40 MG tablet, Take 40 mg by mouth daily., Disp: , Rfl:    predniSONE (DELTASONE) 10 MG tablet, Use as directed per doctors orders., Disp: 21 tablet, Rfl: 0   QUEtiapine (SEROQUEL) 200 MG tablet, Take 200 mg by mouth daily., Disp: , Rfl:    sertraline (ZOLOFT) 100 MG tablet, Take 100 mg by mouth 2 (two) times daily., Disp: , Rfl:    zolpidem (AMBIEN) 10 MG tablet, Take 10 mg by mouth at bedtime., Disp: , Rfl:    Medications ordered in this encounter:  No orders of the defined types were placed in this encounter.    *If you need refills on other medications prior to your next appointment, please contact your pharmacy*  Follow-Up: Call back or seek an in-person evaluation if the symptoms worsen or if the condition fails to improve as anticipated.  Other Instructions Take antibiotic (Doxycyline) as directed.  Increase fluids.  Get plenty of rest. Use Mucinex for congestion. Tessalon as directed for cough. Take a daily probiotic (I recommend Align or Culturelle, but even Activia Yogurt may be  beneficial).  A humidifier placed in the bedroom may offer some relief for a dry, scratchy throat of nasal irritation.  Read information below on acute bronchitis.   Please seek in-person evaluation if symptoms are not resolving, anything worsens or new symptoms develop.   Acute Bronchitis Bronchitis is when the airways that extend from the windpipe into the lungs get red, puffy, and painful (inflamed). Bronchitis often causes thick spit (mucus) to develop. This leads to a cough. A cough is the most common symptom of bronchitis. In acute bronchitis, the condition usually begins suddenly and goes away over time (usually in 2 weeks). Smoking, allergies, and asthma can make bronchitis worse. Repeated episodes of bronchitis may cause more lung problems.  HOME CARE Rest. Drink enough fluids to keep your pee (urine) clear or pale yellow (unless you need to limit fluids as told by your doctor). Only take over-the-counter or prescription medicines as told by your doctor. Avoid smoking and secondhand smoke. These can make bronchitis worse. If you are a smoker, think about using nicotine gum or skin patches. Quitting smoking will help your lungs heal faster. Reduce the chance of getting bronchitis again by: Washing your hands often. Avoiding people with cold symptoms. Trying not to touch your hands to your mouth, nose, or eyes. Follow up with your doctor as told.  GET HELP IF: Your symptoms do not improve after 1 week of treatment. Symptoms include: Cough. Fever. Coughing up thick spit. Body aches.  Chest congestion. Chills. Shortness of breath. Sore throat.  GET HELP RIGHT AWAY IF:  You have an increased fever. You have chills. You have severe shortness of breath. You have bloody thick spit (sputum). You throw up (vomit) often. You lose too much body fluid (dehydration). You have a severe headache. You faint.  MAKE SURE YOU:  Understand these instructions. Will watch your  condition. Will get help right away if you are not doing well or get worse. Document Released: 08/09/2007 Document Revised: 10/23/2012 Document Reviewed: 08/13/2012 [facility] Patient Information 2015 [place], [place]. This information is not intended to replace advice given to you by your health care provider. Make sure you discuss any questions you have with your health care provider.    If you have been instructed to have an in-person evaluation today at a local Urgent Care facility, please use the link below. It will take you to a list of all of our available [facility], including address, phone number and hours of operation. Please do not delay care.  [facility]  If you or a family member do not have a primary care provider, use the link below to schedule a visit and establish care. When you choose a [facility] primary care physician or advanced practice provider, you gain a long-term partner in health. Find a Primary Care Provider  Learn more about [facility]'s in-office and virtual care options: [facility]

## 2020-11-03 ENCOUNTER — Ambulatory Visit (INDEPENDENT_AMBULATORY_CARE_PROVIDER_SITE_OTHER): Payer: Medicare Other | Admitting: Family Medicine

## 2020-11-03 ENCOUNTER — Other Ambulatory Visit: Payer: Self-pay

## 2020-11-03 ENCOUNTER — Ambulatory Visit
Admission: RE | Admit: 2020-11-03 | Discharge: 2020-11-03 | Disposition: A | Discharge status: Home / Self Care | Payer: Medicare Other | Source: Ambulatory Visit | Attending: Family Medicine | Admitting: Family Medicine

## 2020-11-03 VITALS — Ht 68.0 in | Wt 210.0 lb

## 2020-11-03 DIAGNOSIS — M25562 Pain in left knee: Secondary | ICD-10-CM | POA: Diagnosis present

## 2020-11-03 DIAGNOSIS — M79642 Pain in left hand: Secondary | ICD-10-CM | POA: Diagnosis not present

## 2020-11-03 DIAGNOSIS — M25572 Pain in left ankle and joints of left foot: Secondary | ICD-10-CM | POA: Diagnosis not present

## 2020-11-03 MED ORDER — MELOXICAM 15 MG PO TABS: ORAL_TABLET | ORAL | 0 refills | Status: AC

## 2020-11-03 NOTE — Assessment & Plan Note (Addendum)
Exam is overall reassuring.  Her pain is most likely secondary to sprain and contusion.  Discussed with patient that ultrasound is reassuring, however she would like to have formal x-rays.  These x-rays will be ordered.  In the meantime she can take meloxicam daily scheduled for 1 week and then use as needed.  She was also given an ASO brace for stability.  We will have her follow-up in 4 weeks.  If her x-ray shows fracture, we will call patient to discuss further plan.

## 2020-11-03 NOTE — Assessment & Plan Note (Signed)
Her exam is overall reassuring and ultrasound does not show any obvious fractures.  She would like an x-ray, therefore this was ordered.  She can take anti-inflammatories per above.  Follow-up in 4 weeks.

## 2020-11-03 NOTE — Progress Notes (Signed)
Lacey French is a 55 y.o. female who presents to [facility] today for the following:  Left Ankle Pain Left Knee Pain Left 2nd metacarpal pain 1 month ago [place] stairs She states that her left ankle was caught in the ladder and twisted her entire left lower extremity She had immediate pain in her left ankle and had a bruise for 2 to 3 weeks, minimal swelling She states that it was very painful to walk because of her ankle and knee, but has slightly improved She states that then the pain is mostly lateral and keeps her from doing rowing as exercise, she is only able to walk She also states that she must of hit her left hand as she has been having pain in the radial side of her second metacarpal since the fall She has been taking Tylenol for the pain which helps a small amount The bruising has gone away, but she continues to have some pain She reports that the ankle is painful at rest and mostly at the medial aspect of her ankle over the medial malleolus and just distal to this She denies any swelling of her knee, locking, catching, instability She reports prior history of a foot surgery in 2003 in [place], and knee scope in 2001 that did involve her meniscus on the left, and a right rotator cuff repair with biceps tendon repair in 2007   [place] reviewed.  ROS as above. Medications reviewed.  Exam:  Ht '5\' 8"'$  (1.727 m)   Wt 210 lb (95.3 kg)   BMI 31.93 kg/m  Gen: Well NAD MSK:  Left Ankle: - Inspection: No obvious deformity, erythema, swelling, or ecchymosis, ulcers, calluses, blisters b/l - Palpation: No TTP at MT heads, no TTP at base of 5th MT, no TTP over cuboid, no tenderness over navicular prominence, no TTP over lateral malleolus, she does have tenderness to palpation over the medial malleolus No sign of peroneal tendon subluxation or TTP. - Strength: Normal strength with dorsiflexion, plantarflexion, inversion, and eversion  of foot; flexion and extension of toes b/l - ROM: Full ROM b/l - Neuro/vasc: NV intact distally bilaterally - Special Tests: Negative anterior drawer, normal inversion test.  Negative syndesmotic compression.  Feet: no deformity noted b/l.  Normal arches without pes cavus or planus   Left knee: - Inspection: no gross deformity b/l. No swelling/effusion, erythema or bruising b/l. Skin intact - Palpation: Diffuse tenderness palpation along the left lateral joint line and into the left IT band - ROM: full active ROM with flexion and extension in knee and hip b/l - Strength: 5/5 strength b/l - Neuro/vasc: NV intact distally b/l - Special Tests: - LIGAMENTS: negative anterior and posterior drawer, negative Lachman's, no MCL or LCL laxity  -- MENISCUS: Equivocal McMurray's on left, negative Thessaly  -- PF JOINT: nml patellar mobility bilaterally.  negative patellar grind, negative patellar apprehension  Hips: normal ROM, negative FABER and FADIR bilaterally, of note she has decreased hip abduction to about 45 degrees secondary to flexibility   Left hand: Inspection: No obvious deformity b/l. No swelling, erythema or bruising b/l Palpation: She has mild tenderness to palpation along the radial aspect of her distal second metacarpal ROM: Full ROM of the digits and wrist b/l. Fully able to extend and flex finger at DIP and PIP. Strength: 5/5 strength in the forearm, wrist and interosseus muscles b/l Neurovascular: NV intact b/l  MSK ultrasound knee: Images  were obtained both in the transverse and longitudinal plane. Patellar and quadriceps tendons were well visualized with no abnormalities. Minimal effusion. Medial and lateral menisci were well visualized with no abnormalities. Few bilateral joint line osteophytes noted Trochlear view with some decreased cartilage space Impression: Mild degenerative changes with minimal effusion  [place] left ankle:  - Anterior joint: No evidence of ankle  effusion.  Tibiotalar joint appears normal. medial and posterior talar dome unremarkable - Tibial posterior: Evaluate longitudinal and transverse planes.  Tendon intact with no evidence of tenosynovitis - Flexor digitorum: Evaluate longitudinal and transverse planes.  Tendon intact with no evidence of tenosynovitis - Flexor  pollicis: Evaluated in longitudinal planes.  Tendon intact with no evidence to tenosynovitis - Deltoid ligament visualized without abnormality - medial malleolus visualized in transverse and longitudinal plane without obvious cortical disruption or irregularity Impression: Normal ankle [place]  Limited left hand [place] 2nd metacarpal visualized in transverse and longitudinal plane without obvious cortical disruption or irregularity  No results found.   Assessment and Plan: 1) Acute left ankle pain Exam is overall reassuring.  Her pain is most likely secondary to sprain and contusion.  Discussed with patient that ultrasound is reassuring, however she would like to have formal x-rays.  These x-rays will be ordered.  In the meantime she can take meloxicam daily scheduled for 1 week and then use as needed.  She was also given an ASO brace for stability.  We will have her follow-up in 4 weeks.  If her x-ray shows fracture, we will call patient to discuss further plan.  Acute pain of left knee Her knee exam is reassuring.  She does have a equivocal McMurray's, but no significant pain with this and she has negative Thessaly's, therefore unlikely meniscal pathology.  It is possible that she has slightly bruised her meniscus or cause a contusion to the lateral aspect of her knee.  We will treat her with anti-inflammatories per above.  She would like an x-ray, therefore this was ordered to rule out any acute fracture.  We will follow-up with her in 4 weeks.  Left hand pain Her exam is overall reassuring and ultrasound does not show any obvious fractures.  She would like an x-ray, therefore  this was ordered.  She can take anti-inflammatories per above.  Follow-up in 4 weeks.   Ari[place] Constable, D.O.  PGY-4 [facility]  11/03/2020 5:19 PM  Addendum:  Patient seen in the office by fellow.  Her history, exam, plan of care were precepted with me.  Karlton Lemon MD Kirt Boys

## 2020-11-03 NOTE — Patient Instructions (Addendum)
Thank you for coming to see me today. It was a pleasure. Today we talked about:   I have placed an order for x-rays of your left ankle, knee, and hand.  Please go to [facility] to have this completed.  You do not need an appointment.  We will contact you with your results afterwards.  You can take mobic daily for the next week and then daily as needed for your pain.  Talk to your PCP and if she doesn't want you to take this, you can use over the counter voltaren gel three times a day on the areas that bother you.  You can use an ankle brace for support for the next few weeks.  Please follow-up with [place] in 4 weeks.  If you have any questions or concerns, please do not hesitate to call the office at [phone].  Best,   Arizona Constable, DO [place]

## 2020-11-03 NOTE — Assessment & Plan Note (Signed)
Her knee exam is reassuring.  She does have a equivocal McMurray's, but no significant pain with this and she has negative Thessaly's, therefore unlikely meniscal pathology.  It is possible that she has slightly bruised her meniscus or cause a contusion to the lateral aspect of her knee.  We will treat her with anti-inflammatories per above.  She would like an x-ray, therefore this was ordered to rule out any acute fracture.  We will follow-up with her in 4 weeks.

## 2020-12-01 ENCOUNTER — Ambulatory Visit: Payer: Medicare Other | Admitting: Family Medicine

## 2021-02-08 ENCOUNTER — Other Ambulatory Visit: Payer: Self-pay | Admitting: Gastroenterology

## 2021-02-21 ENCOUNTER — Other Ambulatory Visit: Payer: Self-pay | Admitting: Family Medicine

## 2021-02-21 DIAGNOSIS — Z1231 Encounter for screening mammogram for malignant neoplasm of breast: Secondary | ICD-10-CM

## 2021-03-03 ENCOUNTER — Encounter (HOSPITAL_COMMUNITY): Payer: Self-pay | Admitting: Gastroenterology

## 2021-03-03 NOTE — Progress Notes (Signed)
Attempted to obtain medical history via telephone, unable to reach at this time. Unable to leave voicemail to return pre surgical testing department's phone call,due to mailbox full.  

## 2021-03-14 NOTE — Anesthesia Preprocedure Evaluation (Addendum)
Anesthesia Evaluation  Patient identified by MRN, date of birth, ID band Patient awake    Reviewed: Allergy & Precautions, NPO status , Patient's Chart, lab work & pertinent test results  History of Anesthesia Complications (+) history of anesthetic complications ("allergy to propofol"- marked as such when she had respiratory complications in 8466 w/ a surgery. did well with GA twice prior to that)  Airway Mallampati: II  TM Distance: >3 FB Neck ROM: Full    Dental no notable dental hx. (+) Teeth Intact, Dental Advisory Given   Pulmonary asthma , COPD,  Well controlled hasnt used inhaler in years   Pulmonary exam normal breath sounds clear to auscultation       Cardiovascular negative cardio ROS Normal cardiovascular exam Rhythm:Regular Rate:Normal     Neuro/Psych PSYCHIATRIC DISORDERS Anxiety Depression negative neurological ROS     GI/Hepatic negative GI ROS, Neg liver ROS,   Endo/Other  negative endocrine ROS  Renal/GU negative Renal ROS  negative genitourinary   Musculoskeletal  (+) Arthritis , Osteoarthritis,    Abdominal   Peds  Hematology negative hematology ROS (+)   Anesthesia Other Findings   Reproductive/Obstetrics negative OB ROS                           Anesthesia Physical Anesthesia Plan  ASA: 2  Anesthesia Plan: MAC   Post-op Pain Management:    Induction:   PONV Risk Score and Plan: 2 and Propofol infusion and TIVA  Airway Management Planned: Natural Airway and Simple Face Mask  Additional Equipment: None  Intra-op Plan:   Post-operative Plan:   Informed Consent: I have reviewed the patients History and Physical, chart, labs and discussed the procedure including the risks, benefits and alternatives for the proposed anesthesia with the patient or authorized representative who has indicated his/her understanding and acceptance.     Dental advisory  given  Plan Discussed with: CRNA  Anesthesia Plan Comments:        Anesthesia Quick Evaluation

## 2021-03-15 ENCOUNTER — Encounter (HOSPITAL_COMMUNITY)
Admission: RE | Disposition: A | Discharge status: Home / Self Care | Payer: Self-pay | Source: Home / Self Care | Attending: Gastroenterology

## 2021-03-15 ENCOUNTER — Ambulatory Visit (HOSPITAL_COMMUNITY): Payer: Medicare Other | Admitting: Anesthesiology

## 2021-03-15 ENCOUNTER — Other Ambulatory Visit: Payer: Self-pay

## 2021-03-15 ENCOUNTER — Encounter (HOSPITAL_COMMUNITY): Payer: Self-pay | Admitting: Gastroenterology

## 2021-03-15 ENCOUNTER — Ambulatory Visit (HOSPITAL_COMMUNITY)
Admission: RE | Admit: 2021-03-15 | Discharge: 2021-03-15 | Disposition: A | Discharge status: Home / Self Care | Payer: Medicare Other | Attending: Gastroenterology | Admitting: Gastroenterology

## 2021-03-15 DIAGNOSIS — F419 Anxiety disorder, unspecified: Secondary | ICD-10-CM | POA: Diagnosis not present

## 2021-03-15 DIAGNOSIS — K648 Other hemorrhoids: Secondary | ICD-10-CM | POA: Insufficient documentation

## 2021-03-15 DIAGNOSIS — F32A Depression, unspecified: Secondary | ICD-10-CM | POA: Diagnosis not present

## 2021-03-15 DIAGNOSIS — K5730 Diverticulosis of large intestine without perforation or abscess without bleeding: Secondary | ICD-10-CM | POA: Insufficient documentation

## 2021-03-15 DIAGNOSIS — F4310 Post-traumatic stress disorder, unspecified: Secondary | ICD-10-CM | POA: Insufficient documentation

## 2021-03-15 DIAGNOSIS — D124 Benign neoplasm of descending colon: Secondary | ICD-10-CM | POA: Insufficient documentation

## 2021-03-15 DIAGNOSIS — K644 Residual hemorrhoidal skin tags: Secondary | ICD-10-CM | POA: Insufficient documentation

## 2021-03-15 DIAGNOSIS — D125 Benign neoplasm of sigmoid colon: Secondary | ICD-10-CM | POA: Insufficient documentation

## 2021-03-15 DIAGNOSIS — Z1211 Encounter for screening for malignant neoplasm of colon: Secondary | ICD-10-CM | POA: Insufficient documentation

## 2021-03-15 HISTORY — PX: POLYPECTOMY: SHX5525

## 2021-03-15 HISTORY — PX: COLONOSCOPY: SHX5424

## 2021-03-15 SURGERY — COLONOSCOPY
Anesthesia: Monitor Anesthesia Care

## 2021-03-15 MED ORDER — LACTATED RINGERS IV SOLN: INTRAVENOUS | Status: DC

## 2021-03-15 MED ORDER — PROPOFOL 500 MG/50ML IV EMUL
INTRAVENOUS | Status: DC | PRN
  Administered 2021-03-15: 125 ug/kg/min via INTRAVENOUS

## 2021-03-15 MED ORDER — PROPOFOL 10 MG/ML IV BOLUS
INTRAVENOUS | Status: DC | PRN
  Administered 2021-03-15: 80 mg via INTRAVENOUS
  Administered 2021-03-15: 40 mg via INTRAVENOUS

## 2021-03-15 MED ORDER — LACTATED RINGERS IV SOLN
INTRAVENOUS | Status: AC | PRN
  Administered 2021-03-15: 1000 mL

## 2021-03-15 MED ORDER — SODIUM CHLORIDE 0.9 % IV SOLN: INTRAVENOUS | Status: DC

## 2021-03-15 NOTE — H&P (Signed)
Primary Care Physician:  Kathyrn Lass, MD Primary Gastroenterologist:  [facility]  Reason for Visit : Colon cancer screening  HPI: Lacey French is a 56 y.o. female with past medical history of anxiety, PTSD and depression presented here for outpatient colonoscopy for colon cancer screening.  No previous colonoscopy.  No family history of colon cancer.  Reported adverse reaction to anesthesia in the past.  Unfortunately, details are not available to review.  Patient denies any GI symptoms  Past Medical History:  Diagnosis Date   Allergy    Anxiety    Arthritis    Asthma    COPD (chronic obstructive pulmonary disease) (HCC)    Depression    Hyperlipidemia    PTSD (post-traumatic stress disorder)     Past Surgical History:  Procedure Laterality Date   BREAST LUMPECTOMY Left    2, both benign   BREAST SURGERY     REDUCTION   JOINT REPLACEMENT     L shoulder, L knee   REDUCTION MAMMAPLASTY Bilateral    TONSILLECTOMY      Prior to Admission medications   Medication Sig Start Date End Date Taking? Authorizing Provider  atorvastatin (LIPITOR) 40 MG tablet Take 40 mg by mouth at bedtime.   Yes [provider]  buPROPion (WELLBUTRIN XL) 150 MG 24 hr tablet Take 150 mg by mouth at bedtime. 04/17/17  Yes [provider]  LORazepam (ATIVAN) 1 MG tablet Take 0.5 mg by mouth every 8 (eight) hours.   Yes [provider]  Omega-3 Fatty Acids (FISH OIL) 1200 MG CAPS Take 1,200 mg by mouth at bedtime.   Yes [provider]  QUEtiapine (SEROQUEL) 200 MG tablet Take 100 mg by mouth at bedtime. 04/24/17  Yes [provider]  sertraline (ZOLOFT) 100 MG tablet Take 100 mg by mouth at bedtime.   Yes [provider]  TURMERIC PO Take 1,500 mg by mouth every evening.   Yes [provider]  zolpidem (AMBIEN) 10 MG tablet Take 10 mg by mouth at bedtime.   Yes [provider]  benzonatate (TESSALON) 100 MG capsule Take 1 capsule (100  mg total) by mouth 3 (three) times daily as needed for cough. Patient not taking: Reported on 03/03/2021 10/10/20   Brunetta Jeans, PA-C  doxycycline (VIBRA-TABS) 100 MG tablet Take 1 tablet (100 mg total) by mouth 2 (two) times daily. Patient not taking: Reported on 03/03/2021 10/10/20   Brunetta Jeans, PA-C  meloxicam (MOBIC) 15 MG tablet Take 1 tablet daily with food for 7 days. Then take as needed. Patient not taking: Reported on 03/03/2021 11/03/20   Meccariello, Bernita Raisin, DO  valACYclovir (VALTREX) 1000 MG tablet Take 1,000 mg by mouth daily as needed (fever blisters.).    [provider]    Scheduled Meds: Continuous Infusions:  sodium chloride     lactated ringers     lactated ringers 1,000 mL (03/15/21 0739)   PRN Meds:.lactated ringers  Allergies as of 02/08/2021 - Review Complete 11/03/2020  Allergen Reaction Noted   Propofol Shortness Of Breath 04/19/2016   Paroxetine hcl Other (See Comments) 04/19/2016   Prozac [fluoxetine]  11/02/2016    Family History  Problem Relation Age of Onset   Cancer Mother    Hypertension Mother    Breast cancer Mother    Diabetes Father    Diabetes Sister    Hyperlipidemia Sister    Hypertension Sister     Social History   Socioeconomic History  Marital status: Single    Spouse name: Not on file   Number of children: Not on file   Years of education: Not on file   Highest education level: Not on file  Occupational History   Not on file  Tobacco Use   Smoking status: Never   Smokeless tobacco: Never  Vaping Use   Vaping Use: Never used  Substance and Sexual Activity   Alcohol use: Yes    Comment: 3   Drug use: No   Sexual activity: Not on file  Other Topics Concern   Not on file  Social History Narrative   Not on file   Social Determinants of Health   Financial Resource Strain: Not on file  Food Insecurity: Not on file  Transportation Needs: Not on file  Physical Activity: Not on file  Stress: Not on  file  Social Connections: Not on file  Intimate Partner Violence: Not on file    Review of Systems: All negative except as stated above in HPI.  Physical Exam: Vital signs: Vitals:   03/15/21 0718  BP: (!) 107/92  Pulse: 70  Resp: 14  Temp: 98.6 F (37 C)  SpO2: 97%     General:   Alert,  Well-developed, well-nourished, pleasant and cooperative in NAD Lungs: No visible respiratory distress, anterior exam, clear to auscultate Heart:  Regular rate and rhythm; no murmurs, clicks, rubs,  or gallops. Abdomen: Soft, nontender, nondistended, bowel sounds present.  No peritoneal signs Rectal:  Deferred  GI:  Lab Results: No results for input(s): WBC, HGB, HCT, PLT in the last 72 hours. BMET No results for input(s): NA, K, CL, CO2, GLUCOSE, BUN, CREATININE, CALCIUM in the last 72 hours. LFT No results for input(s): PROT, ALBUMIN, AST, ALT, ALKPHOS, BILITOT, BILIDIR, IBILI in the last 72 hours. PT/INR No results for input(s): LABPROT, INR in the last 72 hours.   Studies/Results: No results found.  Impression/Plan: -Colon cancer screening  Recommendations ------------------------ -Proceed with colonoscopy today.   Risks (bleeding, infection, bowel perforation that could require surgery, sedation-related changes in cardiopulmonary systems), benefits (identification and possible treatment of source of symptoms, exclusion of certain causes of symptoms), and alternatives (watchful waiting, radiographic imaging studies, empiric medical treatment)  were explained to patient/family in detail and patient wishes to proceed.     LOS: 0 days   Otis Brace  MD, FACP 03/15/2021, 8:29 AM  Contact #  [phone]

## 2021-03-15 NOTE — Transfer of Care (Signed)
Immediate Anesthesia Transfer of Care Note  Patient: Maclaine Ahola  Procedure(s) Performed: COLONOSCOPY POLYPECTOMY  Patient Location: PACU and Endoscopy Unit  Anesthesia Type:MAC  Level of Consciousness: awake and drowsy  Airway & Oxygen Therapy: Patient Spontanous Breathing  Post-op Assessment: Report given to RN and Post -op Vital signs reviewed and stable  Post vital signs: Reviewed and stable  Last Vitals:  Vitals Value Taken Time  BP    Temp    Pulse 70 03/15/21 0911  Resp    SpO2 95 % 03/15/21 0911  Vitals shown include unvalidated device data.  Last Pain:  Vitals:   03/15/21 0718  TempSrc: Oral  PainSc: 0-No pain         Complications: No notable events documented.

## 2021-03-15 NOTE — Op Note (Signed)
[facility] Patient Name: Lacey French Procedure Date: 03/15/2021 MRN: [id number] Attending MD: Otis Brace , MD Date of Birth: 1965-04-02 CSN: [id number] Age: 56 Admit Type: Outpatient Procedure:                Colonoscopy Indications:              Screening for colorectal malignant neoplasm, This                            is the patient's first colonoscopy Providers:                Otis Brace, MD, Burtis Junes, RN, Frazier Richards,                            Technician Referring MD:              Medicines:                Sedation Administered by an Anesthesia Professional Complications:            No immediate complications. Estimated Blood Loss:     Estimated blood loss was minimal. Procedure:                Pre-Anesthesia Assessment:                           - Prior to the procedure, a History and Physical                            was performed, and patient medications and                            allergies were reviewed. The patient's tolerance of                            previous anesthesia was also reviewed. The risks                            and benefits of the procedure and the sedation                            options and risks were discussed with the patient.                            All questions were answered, and informed consent                            was obtained. Prior Anticoagulants: The patient has                            taken no previous anticoagulant or antiplatelet                            agents. ASA Grade Assessment: II - A patient with  mild systemic disease. After reviewing the risks                            and benefits, the patient was deemed in                            satisfactory condition to undergo the procedure.                           After obtaining informed consent, the colonoscope                            was passed under direct vision. Throughout the                             procedure, the patient's blood pressure, pulse, and                            oxygen saturations were monitored continuously. The                            PCF-HQ190L ([id number]) Olympus colonoscope was                            introduced through the anus and advanced to the the                            cecum, identified by appendiceal orifice and                            ileocecal valve. The colonoscopy was performed with                            moderate difficulty due to a tortuous colon.                            Successful completion of the procedure was aided by                            applying abdominal pressure. The patient tolerated                            the procedure well. The quality of the bowel                            preparation was fair. Scope In: 8:44:35 AM Scope Out: 9:06:34 AM Scope Withdrawal Time: 0 hours 11 minutes 21 seconds  Total Procedure Duration: 0 hours 21 minutes 59 seconds  Findings:      Skin tags were found on perianal exam.      A 6 mm polyp was found in the cecum. The polyp was sessile. The polyp       was removed with a cold snare. Resection and retrieval were complete.      A 5 mm polyp was found in  the descending colon. The polyp was sessile.       The polyp was removed with a cold snare. Resection and retrieval were       complete.      A 5 mm polyp was found in the sigmoid colon. The polyp was sessile. The       polyp was removed with a cold snare. Resection and retrieval were       complete.      Multiple small and large-mouthed diverticula were found in the sigmoid       colon.      Internal hemorrhoids were found during retroflexion. The hemorrhoids       were medium-sized. Impression:               - Preparation of the colon was fair.                           - Perianal skin tags found on perianal exam.                           - One 6 mm polyp in the cecum, removed with a cold                            snare.  Resected and retrieved.                           - One 5 mm polyp in the descending colon, removed                            with a cold snare. Resected and retrieved.                           - One 5 mm polyp in the sigmoid colon, removed with                            a cold snare. Resected and retrieved.                           - Diverticulosis in the sigmoid colon.                           - Internal hemorrhoids. Moderate Sedation:      Moderate (conscious) sedation was personally administered by an       anesthesia professional. The following parameters were monitored: oxygen       saturation, heart rate, blood pressure, and response to care. Recommendation:           - Patient has a contact number available for                            emergencies. The signs and symptoms of potential                            delayed complications were discussed with the  patient. Return to normal activities tomorrow.                            Written discharge instructions were provided to the                            patient.                           - Resume previous diet.                           - Continue present medications.                           - Await pathology results.                           - Repeat colonoscopy in 3 years for surveillance.                           - Return to my office PRN. Procedure Code(s):        --- Professional ---                           [phone], Colonoscopy, flexible; with removal of                            tumor(s), polyp(s), or other lesion(s) by snare                            technique Diagnosis Code(s):        --- Professional ---                           Z12.11, Encounter for screening for malignant                            neoplasm of colon                           K64.8, Other hemorrhoids                           K63.5, Polyp of colon                           K64.4, Residual hemorrhoidal skin  tags                           K57.30, Diverticulosis of large intestine without                            perforation or abscess without bleeding CPT copyright 2019 American Medical Association. All rights reserved. The codes documented in this report are preliminary and upon coder review may  be revised to meet current compliance requirements. Otis Brace, MD Otis Brace, MD 03/15/2021 9:12:11 AM Number of  Addenda: 0

## 2021-03-15 NOTE — Discharge Instructions (Signed)

## 2021-03-15 NOTE — Anesthesia Postprocedure Evaluation (Signed)
Anesthesia Post Note  Patient: Lacey French  Procedure(s) Performed: COLONOSCOPY POLYPECTOMY     Patient location during evaluation: PACU Anesthesia Type: MAC Level of consciousness: awake and alert Pain management: pain level controlled Vital Signs Assessment: post-procedure vital signs reviewed and stable Respiratory status: spontaneous breathing, nonlabored ventilation and respiratory function stable Cardiovascular status: blood pressure returned to baseline and stable Postop Assessment: no apparent nausea or vomiting Anesthetic complications: no   No notable events documented.  Last Vitals:  Vitals:   03/15/21 0920 03/15/21 0930  BP: 99/69 101/76  Pulse: 61 (!) 58  Resp: 18 17  Temp:    SpO2: 98% 100%    Last Pain:  Vitals:   03/15/21 0930  TempSrc:   PainSc: 0-No pain                 Pervis Hocking

## 2021-03-16 LAB — SURGICAL PATHOLOGY

## 2021-03-18 ENCOUNTER — Encounter (HOSPITAL_COMMUNITY): Payer: Self-pay | Admitting: Gastroenterology

## 2021-03-22 IMAGING — MG DIGITAL SCREENING BILAT W/ TOMO W/ CAD
8 series · 8 of 24 positions shown · non-contrast
Comparison: Previous exam(s).

CLINICAL DATA: Screening.

EXAM:
DIGITAL SCREENING BILATERAL MAMMOGRAM WITH TOMO AND CAD

[R CC synth-2D]
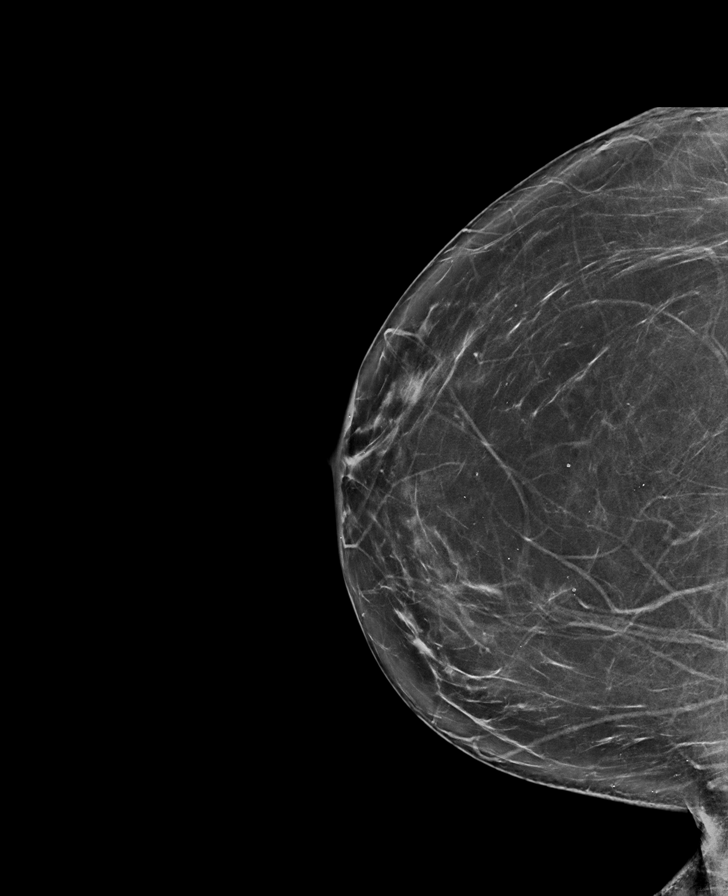

[R MLO synth-2D]
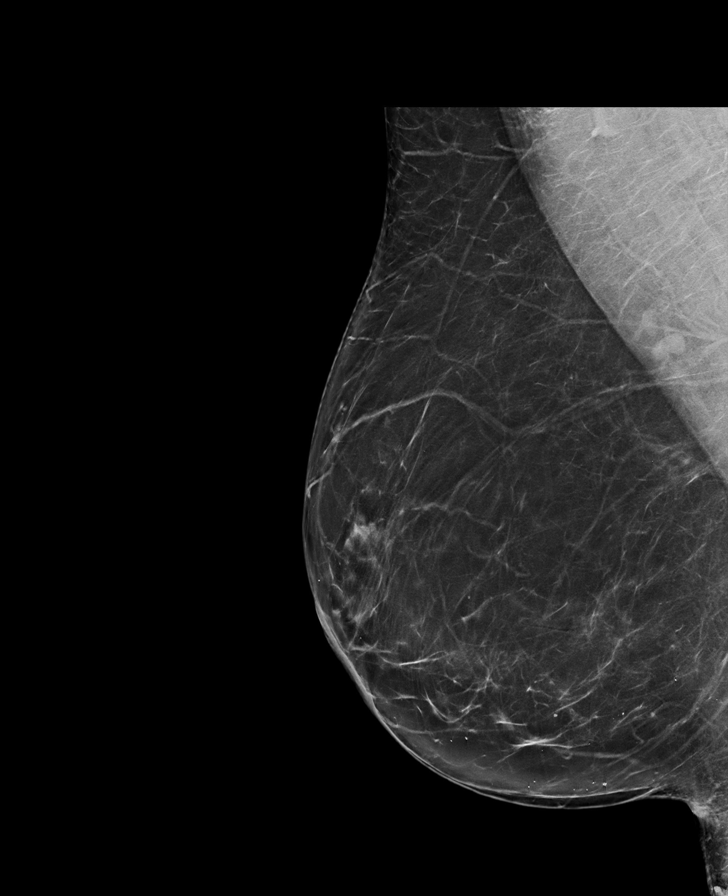

[L MLO synth-2D]
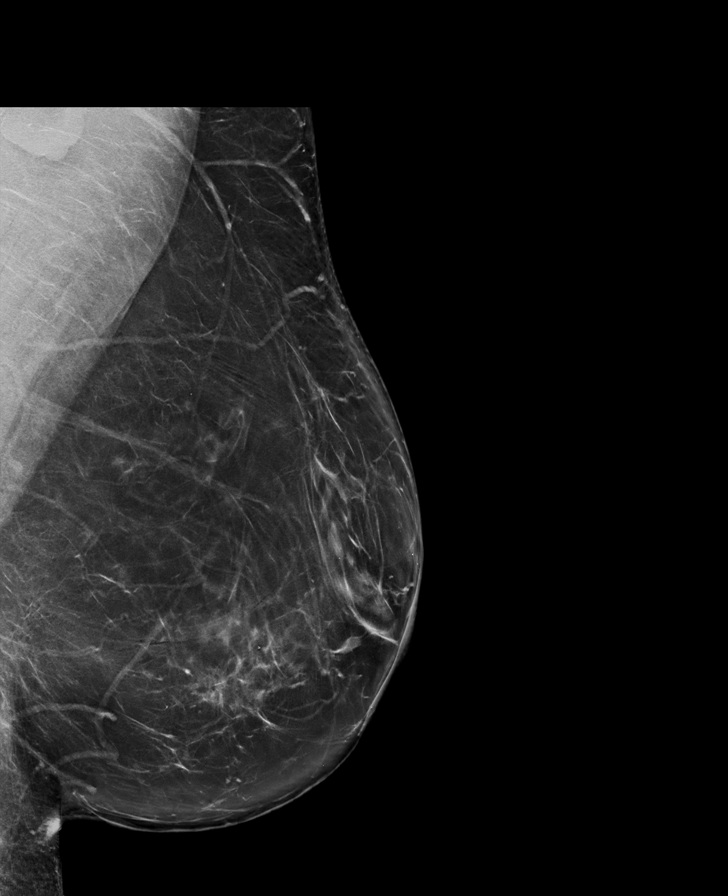

[L CC synth-2D]
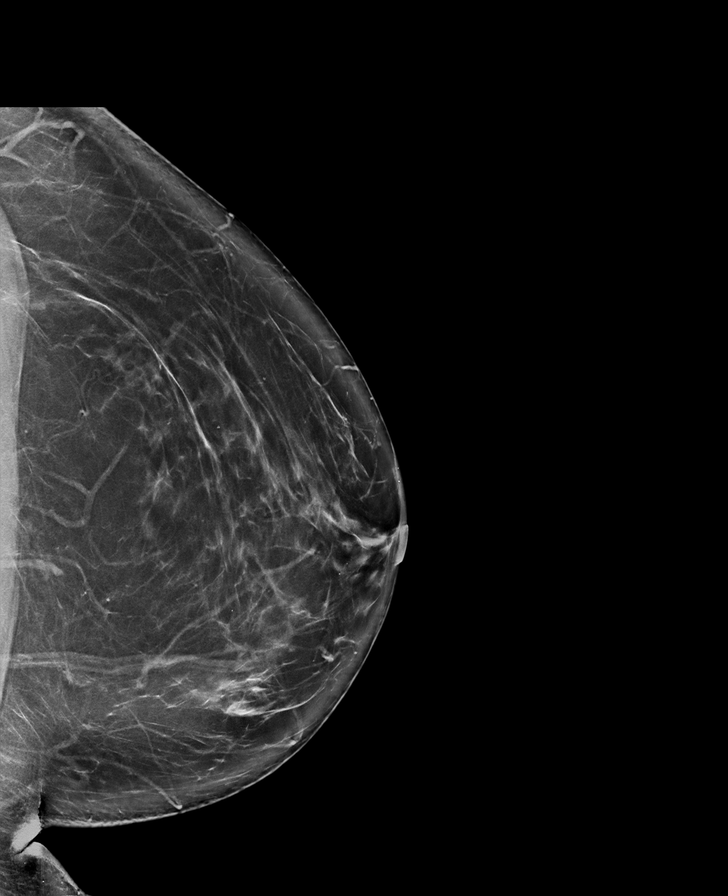

[R CC tomo · tomo slice 39/78.0]
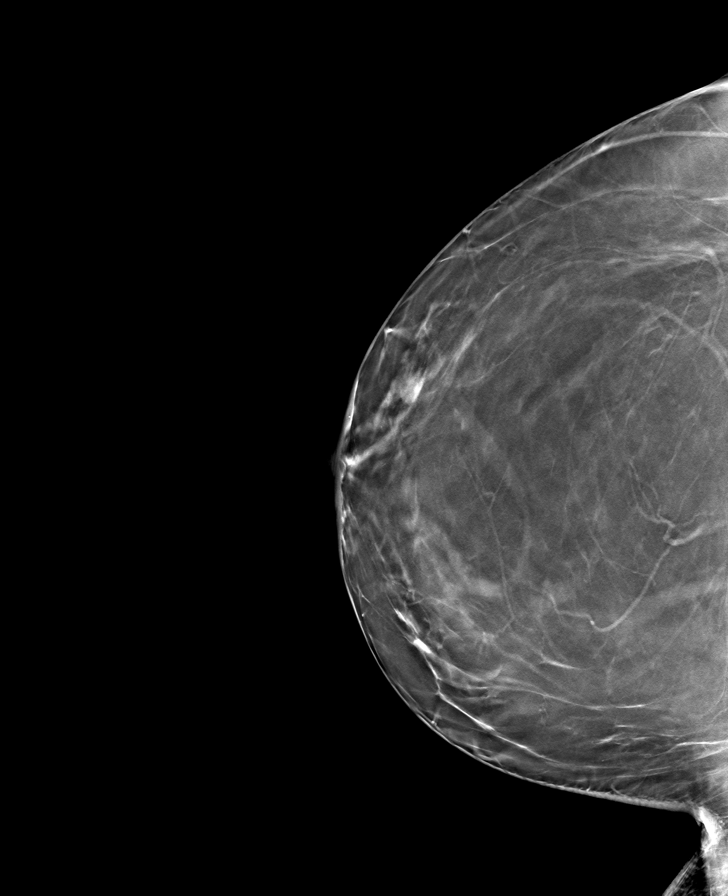

[R MLO tomo · tomo slice 44/87.0]
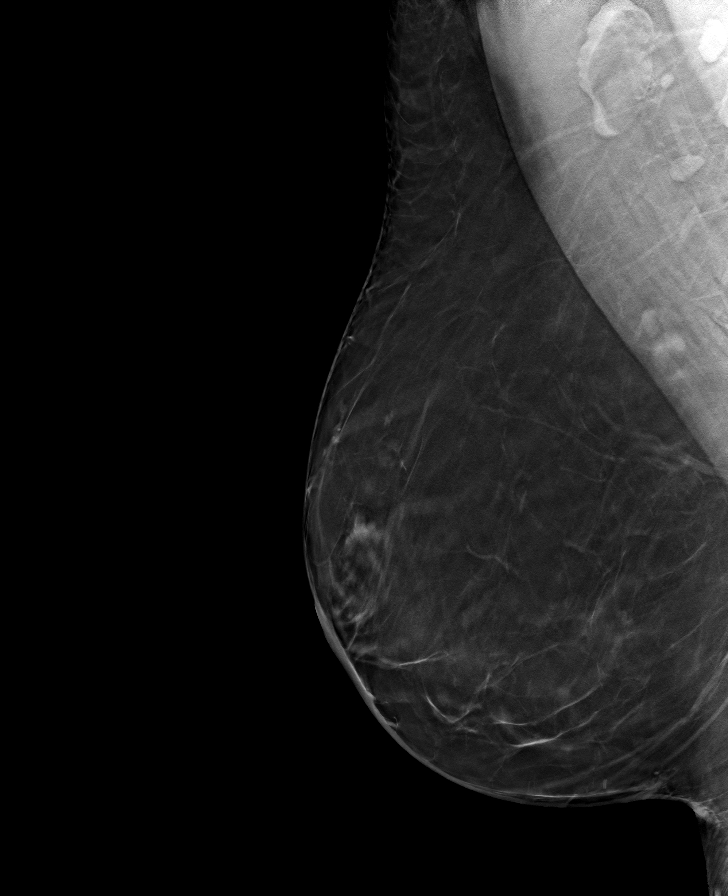

[L CC tomo · tomo slice 44/87.0]
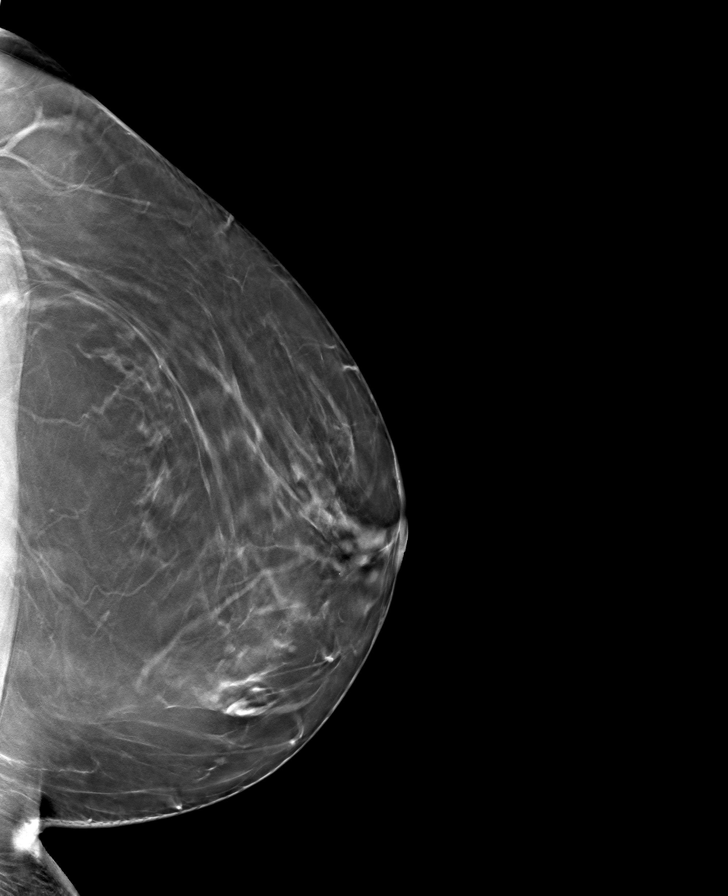

[L MLO tomo · tomo slice 47/93.0]
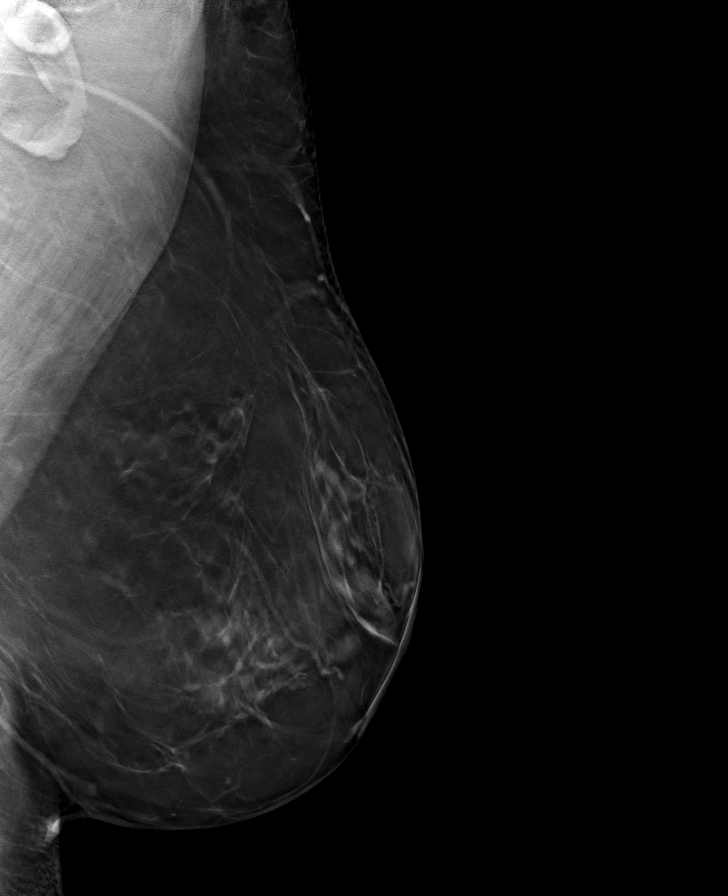

[8 of 24 positions shown; findings below may reference images not displayed]

ACR Breast Density Category b: There are scattered areas of
fibroglandular density.
FINDINGS: There are no findings suspicious for malignancy. Images were
processed with CAD.
IMPRESSION: No mammographic evidence of malignancy. A result letter of this
screening mammogram will be mailed directly to the patient.

RECOMMENDATION:
Screening mammogram in one year. (Code:CN-U-775)

BI-RADS CATEGORY  1: Negative.

## 2021-06-20 ENCOUNTER — Ambulatory Visit
Admission: RE | Admit: 2021-06-20 | Discharge: 2021-06-20 | Disposition: A | Discharge status: Home / Self Care | Payer: Medicare Other | Source: Ambulatory Visit | Attending: Family Medicine | Admitting: Family Medicine

## 2021-06-20 DIAGNOSIS — Z1231 Encounter for screening mammogram for malignant neoplasm of breast: Secondary | ICD-10-CM

## 2021-12-05 ENCOUNTER — Other Ambulatory Visit: Payer: Self-pay | Admitting: Family Medicine

## 2021-12-05 DIAGNOSIS — M810 Age-related osteoporosis without current pathological fracture: Secondary | ICD-10-CM

## 2021-12-13 ENCOUNTER — Ambulatory Visit: Payer: Medicare Other | Attending: Sports Medicine

## 2021-12-13 ENCOUNTER — Other Ambulatory Visit: Payer: Self-pay

## 2021-12-13 DIAGNOSIS — M5459 Other low back pain: Secondary | ICD-10-CM | POA: Insufficient documentation

## 2021-12-13 DIAGNOSIS — M545 Low back pain, unspecified: Secondary | ICD-10-CM | POA: Diagnosis not present

## 2021-12-13 DIAGNOSIS — R252 Cramp and spasm: Secondary | ICD-10-CM

## 2021-12-13 DIAGNOSIS — R293 Abnormal posture: Secondary | ICD-10-CM | POA: Insufficient documentation

## 2021-12-13 DIAGNOSIS — M6281 Muscle weakness (generalized): Secondary | ICD-10-CM | POA: Diagnosis not present

## 2021-12-13 DIAGNOSIS — R262 Difficulty in walking, not elsewhere classified: Secondary | ICD-10-CM | POA: Insufficient documentation
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
## 2021-12-16 ENCOUNTER — Ambulatory Visit: Payer: Medicare Other | Admitting: Rehabilitative and Restorative Service Providers"

## 2021-12-16 ENCOUNTER — Encounter: Payer: Self-pay | Admitting: Rehabilitative and Restorative Service Providers"

## 2021-12-16 DIAGNOSIS — R252 Cramp and spasm: Secondary | ICD-10-CM

## 2021-12-16 DIAGNOSIS — R293 Abnormal posture: Secondary | ICD-10-CM

## 2021-12-16 DIAGNOSIS — M545 Low back pain, unspecified: Secondary | ICD-10-CM | POA: Diagnosis not present

## 2021-12-16 DIAGNOSIS — M5459 Other low back pain: Secondary | ICD-10-CM

## 2021-12-16 DIAGNOSIS — R262 Difficulty in walking, not elsewhere classified: Secondary | ICD-10-CM

## 2021-12-16 DIAGNOSIS — M6281 Muscle weakness (generalized): Secondary | ICD-10-CM
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
## 2021-12-16 NOTE — Patient Instructions (Addendum)
[place] [address] [place], [place] [address] Phone # [phone] Fax [phone]    Trigger Point Dry Needling ? What is Trigger Point Dry Needling (DN)? o DN is a physical therapy technique used to treat muscle pain and dysfunction.  Specifically, DN helps deactivate muscle trigger points (muscle knots).  o A thin filiform needle is used to penetrate the skin and stimulate the underlying  trigger point. The goal is for a local twitch response (LTR) to occur and for the  trigger point to relax. No medication of any kind is injected during the procedure.  ? What Does Trigger Point Dry Needling Feel Like?  o The procedure feels different for each individual patient. Some patients report  that they do not actually feel the needle enter the skin and overall the process is  not painful. Very mild bleeding may occur. However, many patients feel a deep  cramping in the muscle in which the needle was inserted. This is the local twitch  response.  ? How Will I feel after the treatment? o Soreness is normal, and the onset of soreness may not occur for a few hours.  Typically this soreness does not last longer than two days.  o Bruising is uncommon, however; ice can be used to decrease any possible  bruising.  o In rare cases feeling tired or nauseous after the treatment is normal. In addition,  your symptoms may get worse before they get better, this period will typically not  last longer than 24 hours.  ? What Can I do After My Treatment? o Increase your hydration by drinking more water for the next 24 hours. o You may place ice or heat on the areas treated that have become sore, however,  do not use heat on inflamed or bruised areas. Heat often brings more relief post  needling. o You can continue your regular activities, but vigorous activity is not  recommended initially after the treatment for 24 hours. DN is best combined with other  physical therapy such as strengthening, stretching, and other  therapies.       [facility] Welcome to [place]! Here you will find all the information you will need regarding your pool therapy. If you have further questions at any time, please call our office at [phone]. After completing your initial evaluation in the [place] clinic, you may be eligible to complete a portion of your therapy in the pool. A typical week of therapy will consist of 1-2 typical physical therapy visits at our [place] location and an additional session of therapy in the pool located at the [facility] at [facility]. [address] [place]. The phone number at the pool site is [phone]. Please call this number if you are running late or need to cancel your appointment.  Aquatic therapy will be offered on Wednesday mornings and Friday afternoons. Each session will last approximately 45 minutes. All scheduling and payments for aquatic therapy sessions, including cancelations, will be done through our [place] location.  To be eligible for aquatic therapy, these criteria must be met: You must be able to independently change in the locker room and get to the pool deck. A caregiver can come with you to help if needed. There are benches for a caregiver to sit on next to the pool. No one with an open wound is permitted in the pool.  Handicap parking is available in the front and there is  a drop off option for even closer accessibility. Please arrive 15 minutes prior to your appointment to prepare for your pool session. You must sign in at the front desk upon your arrival. Please be sure to attend to any toileting needs prior to entering the pool. [place] rooms for changing are available.  There is direct access to the pool deck from the locker room. You can lock your belongings in a locker or bring them with you poolside. Your therapist  will greet you on the pool deck. There may be other swimmers in the pool at the same time but your session is one-on-one with the therapist.

## 2021-12-20 NOTE — Therapy (Signed)
OUTPATIENT PHYSICAL THERAPY TREATMENT NOTE AND PROGRESS NOTE   Patient Name: Lacey French MRN: [id number] DOB:[birth year], 56 y.o., female Today's Date: 12/21/2021   PT End of Session - 12/21/21 1638     Visit Number 3    Date for PT Re-Evaluation 02/07/22    Authorization Type Medicare    Progress Note Due on Visit 10    PT Start Time 1634    PT Stop Time 0321    PT Time Calculation (min) 41 min    Activity Tolerance Patient tolerated treatment well;Patient limited by pain    Behavior During Therapy WFL for tasks assessed/performed              Past Medical History:  Diagnosis Date   Allergy    Anxiety    Arthritis    Asthma    COPD (chronic obstructive pulmonary disease) ([place])    Depression    Hyperlipidemia    PTSD (post-traumatic stress disorder)    Past Surgical History:  Procedure Laterality Date   BREAST LUMPECTOMY Left    2, both benign   BREAST SURGERY     REDUCTION   COLONOSCOPY N/A 03/15/2021   Procedure: COLONOSCOPY;  Surgeon: Otis Brace, MD;  Location: [facility];  Service: Gastroenterology;  Laterality: N/A;  pt Allergic to Propofol   JOINT REPLACEMENT     L shoulder, L knee   POLYPECTOMY  03/15/2021   Procedure: POLYPECTOMY;  Surgeon: Otis Brace, MD;  Location: [facility];  Service: Gastroenterology;;   REDUCTION MAMMAPLASTY Bilateral    TONSILLECTOMY     Patient Active Problem List   Diagnosis Date Noted   Acute pain of left knee 11/03/2020   Acute left ankle pain 11/03/2020   Left hand pain 11/03/2020   Carpal tunnel syndrome of left wrist 05/16/2017   PTSD (post-traumatic stress disorder) 04/10/2016   Psychosis ([place]) 04/10/2016   Anxiety 04/10/2016   Allergy 04/10/2016   Asthma 04/10/2016   Depression 04/10/2016    PCP: Kathyrn Lass, MD  REFERRING PROVIDER: Inez Catalina, MD   REFERRING DIAG: M54.50 (ICD-10-CM) - Low back pain, unspecified   Rationale for Evaluation and Treatment Rehabilitation  THERAPY  DIAG:  Other low back pain  Muscle weakness (generalized)  Difficulty in walking, not elsewhere classified  Cramp and spasm  ONSET DATE: 12/07/2021  SUBJECTIVE:                                                                                                                                                                                           SUBJECTIVE STATEMENT: Patient reports pain is consistently high has had little to no relief.  PERTINENT HISTORY:  Injured in [facility] on 9/11  PAIN:  Are you having pain? Yes: NPRS scale: 6/10 Pain location: low back Pain description: aching Aggravating factors: hurts all the time Relieving factors: nothing   PRECAUTIONS: None  WEIGHT BEARING RESTRICTIONS No  FALLS:  Has patient fallen in last 6 months? No  LIVING ENVIRONMENT: Lives with: lives with their family Lives in: House/apartment   OCCUPATION: disabled  PLOF: Independent, Independent with basic ADLs, Independent with household mobility without device, Independent with community mobility without device, Independent with homemaking with ambulation, Independent with gait, and Independent with transfers  PATIENT GOALS She hopes to gain relief for her back and knees.    OBJECTIVE:   DIAGNOSTIC FINDINGS:  None listed  PATIENT SURVEYS:  Eval:  FOTO  46% (projected 54% by visit 12)  SCREENING FOR RED FLAGS: Bowel or bladder incontinence: No Spinal tumors: No Cauda equina syndrome: No Compression fracture: No Abdominal aneurysm: No  COGNITION:  Overall cognitive status: Within functional limits for tasks assessed and emotional distress due to 9/11 events      SENSATION: WFL  MUSCLE LENGTH: Hamstrings: Right 60 deg; Left 50 deg Thomas test: Right pos ; Left pos   POSTURE: rounded shoulders and decreased lumbar lordosis  PALPATION: Crepitus bilateral knees  LUMBAR ROM:   Active  A/PROM  eval  Flexion Fingertips to mid shin  Extension WFL   Right lateral flexion Fingertips to just above joint line  Left lateral flexion Fingertips to just above joint line  Right rotation 50%  Left rotation 50%   (Blank rows = not tested)  LOWER EXTREMITY ROM:     WFL with exception of bilateral knee flexion limited to 100 to 120 degrees of flexion comfortably  LOWER EXTREMITY MMT:    Generally 4 to 4-/5 t/o bilateral LE's  LUMBAR SPECIAL TESTS:  Straight leg raise test: Negative  FUNCTIONAL TESTS:  Eval: 5 times sit to stand: complete next visit- time constraints Timed up and go (TUG): complete next visit  12/16/2021:  unable to complete secondary to increased pain.  GAIT: Distance walked: 50 Assistive device utilized: None Level of assistance: Complete Independence Comments: antalgic    TODAY'S TREATMENT  12/21/21 Pt seen for aquatic therapy today.  Treatment took place in water 3.25-4.5 ft in depth at the [facility]. Temp of water was 92.  Pt entered/exited the pool via steps with hand rail.  *Intro to setting *Walking in 4.48f forward; back and side stepping UE unsupported *Cycling on yellow noodle. Trialed add/abd not tolerated; skiing tolerated well. *"Hanging" on noodle for gentle lumbar distraction *seated on sm square noodle: pelvic ROM ant/post pelvic tilts, hip hiking and rotation.  Not tolerated well *L stretch 3x20s hold; L stretch with hip hiking R/L x4 *plank on 3rd step with hip extension x10 tolerated without difficulty Bad ragaz: for core elongation and relaxation. Lateral snaking with contract/relax technique. -above position: hip extension R/Lx4  Pt requires the buoyancy and hydrostatic pressure of water for support, and to offload joints by unweighting joint load by at least 50 % in navel deep water and by at least 75-80% in chest to neck deep water.  Viscosity of the water is needed for resistance of strengthening. Water current perturbations provides challenge to standing balance  requiring increased core activation.     12/16/2021: Nustep level 3 x6 min with PT present to discuss status Prone press up x10 Seated green pball rollout 3x10 sec with "pulling" felt  Seated TA contraction x10 Trigger Point Dry-Needling  Treatment instructions: Expect mild to moderate muscle soreness. S/S of pneumothorax if dry needled over a lung field, and to seek immediate medical attention should they occur. Patient verbalized understanding of these instructions and education. Patient Consent Given: Yes Education handout provided: Yes Muscles treated: bilateral thoracic and lumbar multifidi Electrical stimulation performed: No Parameters: N/A Treatment response/outcome: Utilized skilled palpation to identify trigger points.  Able to palpate twitch response and muscle elongation following dry needling.    PATIENT EDUCATION:  Education details: Initiated HEP Person educated: Patient Education method: Consulting civil engineer, Media planner, Corporate treasurer cues, Verbal cues, and Handouts Education comprehension: verbalized understanding, returned demonstration, verbal cues required, and needs further education   HOME EXERCISE PROGRAM: Access Code: [email or web address] URL: [email or web address] Date: 12/13/2021 Prepared by: Candyce Churn  Exercises - Standing Hamstring Stretch on Chair  - 1 x daily - 7 x weekly - 1 sets - 3 reps - 30 hold - Supine Hamstring Stretch with Strap  - 1 x daily - 7 x weekly - 1 sets - 3 reps - 30 hold - Supine ITB Stretch with Strap  - 1 x daily - 7 x weekly - 1 sets - 3 reps - 30  hold - Supine Figure 4 Piriformis Stretch  - 1 x daily - 7 x weekly - 1 sets - 3 reps - 30 hold - Supine Posterior Pelvic Tilt  - 1 x daily - 7 x weekly - 3 sets - 10 reps  ASSESSMENT:  CLINICAL IMPRESSION: High pain sensitivity upon initiation of session..  Assessed different movements and ranges to determine starting point to strengthen and lengthen muscles with attempts at minimal  increase in pain. Pt is found to be limited due to pain increasingly in hip abd, posterior pelvic tilts (>ant pelvic tilts) and hip hiking. She tolerates extension well. Pt with increase in pain sx by at least 1/2 point upon completion.  She requires VC to slow movements and breath throughout session as she tends to be quick and jerky with all movements.  Instruction on mindfulness of execution to better focus on targeted action.  Pt also educated on use and dose of anti inflammatories/OTC med as she inquired.  She is a good candidate for aquatic therapy and will benefit from the properties of water to facilitate and hasten progression towards goals.Goals ongoing    OBJECTIVE IMPAIRMENTS Abnormal gait, decreased strength, increased muscle spasms, impaired flexibility, postural dysfunction, and pain.   ACTIVITY LIMITATIONS carrying, lifting, bending, sitting, standing, squatting, sleeping, stairs, transfers, bed mobility, and dressing  PARTICIPATION LIMITATIONS: meal prep, cleaning, laundry, interpersonal relationship, driving, shopping, community activity, occupation, and yard work  PERSONAL FACTORS Fitness, Past/current experiences, Time since onset of injury/illness/exacerbation, and 1 comorbidity: PTSD, depression, anxiety  are also affecting patient's functional outcome.   REHAB POTENTIAL: Fair due to multiple injuries and deformities post surgery  CLINICAL DECISION MAKING: Evolving/moderate complexity  EVALUATION COMPLEXITY: Moderate   GOALS: Goals reviewed with patient? Yes  SHORT TERM GOALS: Target date: 01/11/2022  Pain report to be no greater than 5/10. Baseline: Goal status: INITIAL  2.  Patient will be independent with initial HEP  Baseline:  Goal status: INITIAL  LONG TERM GOALS: Target date: 02/08/2022  Patient to be independent with advanced HEP  Baseline:  Goal status: INITIAL  2.  Patient to report pain no increase in pain while performing desired community  tasks. Baseline:  Goal status: INITIAL  3.  FOTO to improve to 54% to demonstrate  improvements in functional tasks. Baseline: 46% Goal status: INITIAL  4.  Lumbar ROM to improve to [facility] on all planes of motion Baseline:  Goal status: INITIAL  5.  Patient to be able to start a walking program and increase by 1/4 mile per session.   Baseline:  Goal status: INITIAL     PLAN: PT FREQUENCY: 2x/week  PT DURATION: 8 weeks  PLANNED INTERVENTIONS: Therapeutic exercises, Therapeutic activity, Neuromuscular re-education, Balance training, Gait training, Patient/Family education, Self Care, Joint mobilization, Joint manipulation, Aquatic Therapy, Dry Needling, Electrical stimulation, Spinal manipulation, Spinal mobilization, Cryotherapy, Moist heat, Taping, Traction, Ultrasound, Manual therapy, and Re-evaluation.  PLAN FOR NEXT SESSION: Assess response to dry needling, aquatic PT as indicated, review HEP, manual therapy/dry needling, as indicated    Denton Meek, PT MPT 12/21/2021, 6:26 PM

## 2021-12-21 ENCOUNTER — Ambulatory Visit (HOSPITAL_BASED_OUTPATIENT_CLINIC_OR_DEPARTMENT_OTHER): Payer: Medicare Other | Attending: Sports Medicine | Admitting: Physical Therapy

## 2021-12-21 ENCOUNTER — Encounter (HOSPITAL_BASED_OUTPATIENT_CLINIC_OR_DEPARTMENT_OTHER): Payer: Self-pay | Admitting: Physical Therapy

## 2021-12-21 DIAGNOSIS — R262 Difficulty in walking, not elsewhere classified: Secondary | ICD-10-CM | POA: Diagnosis not present

## 2021-12-21 DIAGNOSIS — M545 Low back pain, unspecified: Secondary | ICD-10-CM | POA: Insufficient documentation

## 2021-12-21 DIAGNOSIS — M6281 Muscle weakness (generalized): Secondary | ICD-10-CM

## 2021-12-21 DIAGNOSIS — M5459 Other low back pain: Secondary | ICD-10-CM

## 2021-12-21 DIAGNOSIS — R252 Cramp and spasm: Secondary | ICD-10-CM

## 2021-12-27 ENCOUNTER — Encounter: Payer: Self-pay | Admitting: Rehabilitative and Restorative Service Providers"

## 2021-12-27 ENCOUNTER — Ambulatory Visit: Payer: Medicare Other | Admitting: Rehabilitative and Restorative Service Providers"

## 2021-12-27 DIAGNOSIS — R252 Cramp and spasm: Secondary | ICD-10-CM

## 2021-12-27 DIAGNOSIS — R262 Difficulty in walking, not elsewhere classified: Secondary | ICD-10-CM

## 2021-12-27 DIAGNOSIS — M545 Low back pain, unspecified: Secondary | ICD-10-CM | POA: Diagnosis not present

## 2021-12-27 DIAGNOSIS — M6281 Muscle weakness (generalized): Secondary | ICD-10-CM

## 2021-12-27 DIAGNOSIS — M5459 Other low back pain: Secondary | ICD-10-CM

## 2021-12-27 DIAGNOSIS — R293 Abnormal posture: Secondary | ICD-10-CM

## 2021-12-27 NOTE — Therapy (Signed)
OUTPATIENT PHYSICAL THERAPY TREATMENT NOTE    Patient Name: Lacey French MRN: [id number] DOB:1965/06/19, 56 y.o., female Today's Date: 12/27/2021   PT End of Session - 12/27/21 1018     Visit Number 4    Date for PT Re-Evaluation 02/07/22    Authorization Type Medicare    Progress Note Due on Visit 10    PT Start Time 1016    PT Stop Time 1055    PT Time Calculation (min) 39 min    Activity Tolerance Patient tolerated treatment well;Patient limited by pain    Behavior During Therapy WFL for tasks assessed/performed              Past Medical History:  Diagnosis Date   Allergy    Anxiety    Arthritis    Asthma    COPD (chronic obstructive pulmonary disease) ([place])    Depression    Hyperlipidemia    PTSD (post-traumatic stress disorder)    Past Surgical History:  Procedure Laterality Date   BREAST LUMPECTOMY Left    2, both benign   BREAST SURGERY     REDUCTION   COLONOSCOPY N/A 03/15/2021   Procedure: COLONOSCOPY;  Surgeon: Otis Brace, MD;  Location: [facility];  Service: Gastroenterology;  Laterality: N/A;  pt Allergic to Propofol   JOINT REPLACEMENT     L shoulder, L knee   POLYPECTOMY  03/15/2021   Procedure: POLYPECTOMY;  Surgeon: Otis Brace, MD;  Location: [facility];  Service: Gastroenterology;;   REDUCTION MAMMAPLASTY Bilateral    TONSILLECTOMY     Patient Active Problem List   Diagnosis Date Noted   Acute pain of left knee 11/03/2020   Acute left ankle pain 11/03/2020   Left hand pain 11/03/2020   Carpal tunnel syndrome of left wrist 05/16/2017   PTSD (post-traumatic stress disorder) 04/10/2016   Psychosis ([place]) 04/10/2016   Anxiety 04/10/2016   Allergy 04/10/2016   Asthma 04/10/2016   Depression 04/10/2016    PCP: Kathyrn Lass, MD  REFERRING PROVIDER: Inez Catalina, MD   REFERRING DIAG: M54.50 (ICD-10-CM) - Low back pain, unspecified   Rationale for Evaluation and Treatment Rehabilitation  THERAPY DIAG:  Other low  back pain  Muscle weakness (generalized)  Difficulty in walking, not elsewhere classified  Cramp and spasm  Abnormal posture  ONSET DATE: 12/07/2021  SUBJECTIVE:                                                                                                                                                                                           SUBJECTIVE STATEMENT: Patient reports that she was able to do things in the  pool that she could not do on land.  PERTINENT HISTORY:  Injured in [facility] on 9/11  PAIN:  Are you having pain? Yes: NPRS scale: 5-6/10 Pain location: low back Pain description: aching Aggravating factors: hurts all the time Relieving factors: nothing   PRECAUTIONS: None  WEIGHT BEARING RESTRICTIONS No  FALLS:  Has patient fallen in last 6 months? No  LIVING ENVIRONMENT: Lives with: lives with their family Lives in: House/apartment   OCCUPATION: disabled  PLOF: Independent, Independent with basic ADLs, Independent with household mobility without device, Independent with community mobility without device, Independent with homemaking with ambulation, Independent with gait, and Independent with transfers  PATIENT GOALS She hopes to gain relief for her back and knees.    OBJECTIVE:   DIAGNOSTIC FINDINGS:  None listed  PATIENT SURVEYS:  Eval:  FOTO  46% (projected 54% by visit 12)  SCREENING FOR RED FLAGS: Bowel or bladder incontinence: No Spinal tumors: No Cauda equina syndrome: No Compression fracture: No Abdominal aneurysm: No  COGNITION:  Overall cognitive status: Within functional limits for tasks assessed and emotional distress due to 9/11 events      SENSATION: WFL  MUSCLE LENGTH: Hamstrings: Right 60 deg; Left 50 deg Thomas test: Right pos ; Left pos   POSTURE: rounded shoulders and decreased lumbar lordosis  PALPATION: Crepitus bilateral knees  LUMBAR ROM:   Active  A/PROM  eval  Flexion Fingertips to mid  shin  Extension WFL  Right lateral flexion Fingertips to just above joint line  Left lateral flexion Fingertips to just above joint line  Right rotation 50%  Left rotation 50%   (Blank rows = not tested)  LOWER EXTREMITY ROM:     WFL with exception of bilateral knee flexion limited to 100 to 120 degrees of flexion comfortably  LOWER EXTREMITY MMT:    Generally 4 to 4-/5 t/o bilateral LE's  LUMBAR SPECIAL TESTS:  Straight leg raise test: Negative  FUNCTIONAL TESTS:  Eval: 5 times sit to stand: complete next visit- time constraints Timed up and go (TUG): complete next visit  12/16/2021:  unable to complete secondary to increased pain.  GAIT: Distance walked: 50 Assistive device utilized: None Level of assistance: Complete Independence Comments: antalgic    TODAY'S TREATMENT  12/27/2021: Nustep level 1 (seat at 8, blue machine) x7 min with PT present to discuss status Seated green pball rollout 3x10 sec with "pulling" felt.  Verbal cues to only go to initial point of stretch Seated TA contraction gently pushing purple ball into thighs 2x10 Trigger Point Dry-Needling  Treatment instructions: Expect mild to moderate muscle soreness. S/S of pneumothorax if dry needled over a lung field, and to seek immediate medical attention should they occur. Patient verbalized understanding of these instructions and education. Patient Consent Given: Yes Education handout provided: Yes Muscles treated: bilateral thoracic and lumbar multifidi Electrical stimulation performed: No Parameters: N/A Treatment response/outcome: Utilized skilled palpation to identify trigger points.  Able to palpate twitch response and muscle elongation following dry needling.  Manual Therapy:  Performed soft tissue mobilization and tissue gliding with silicone suction cup following dry needling to encourage further tissue elongation.   12/21/21 Pt seen for aquatic therapy today.  Treatment took place in water  3.25-4.5 ft in depth at the [facility]. Temp of water was 92.  Pt entered/exited the pool via steps with hand rail.  *Intro to setting *Walking in 4.74f forward; back and side stepping UE unsupported *Cycling on yellow noodle. Trialed  add/abd not tolerated; skiing tolerated well. *"Hanging" on noodle for gentle lumbar distraction *seated on sm square noodle: pelvic ROM ant/post pelvic tilts, hip hiking and rotation.  Not tolerated well *L stretch 3x20s hold; L stretch with hip hiking R/L x4 *plank on 3rd step with hip extension x10 tolerated without difficulty Bad ragaz: for core elongation and relaxation. Lateral snaking with contract/relax technique. -above position: hip extension R/Lx4  Pt requires the buoyancy and hydrostatic pressure of water for support, and to offload joints by unweighting joint load by at least 50 % in navel deep water and by at least 75-80% in chest to neck deep water.  Viscosity of the water is needed for resistance of strengthening. Water current perturbations provides challenge to standing balance requiring increased core activation.     12/16/2021: Nustep level 3 x6 min with PT present to discuss status Prone press up x10 Seated green pball rollout 3x10 sec with "pulling" felt Seated TA contraction x10 Trigger Point Dry-Needling  Treatment instructions: Expect mild to moderate muscle soreness. S/S of pneumothorax if dry needled over a lung field, and to seek immediate medical attention should they occur. Patient verbalized understanding of these instructions and education. Patient Consent Given: Yes Education handout provided: Yes Muscles treated: bilateral thoracic and lumbar multifidi Electrical stimulation performed: No Parameters: N/A Treatment response/outcome: Utilized skilled palpation to identify trigger points.  Able to palpate twitch response and muscle elongation following dry needling.    PATIENT EDUCATION:  Education details:  Initiated HEP Person educated: Patient Education method: Consulting civil engineer, Media planner, Corporate treasurer cues, Verbal cues, and Handouts Education comprehension: verbalized understanding, returned demonstration, verbal cues required, and needs further education   HOME EXERCISE PROGRAM: Access Code: [email or web address] URL: [email or web address] Date: 12/13/2021 Prepared by: Candyce Churn  Exercises - Standing Hamstring Stretch on Chair  - 1 x daily - 7 x weekly - 1 sets - 3 reps - 30 hold - Supine Hamstring Stretch with Strap  - 1 x daily - 7 x weekly - 1 sets - 3 reps - 30 hold - Supine ITB Stretch with Strap  - 1 x daily - 7 x weekly - 1 sets - 3 reps - 30  hold - Supine Figure 4 Piriformis Stretch  - 1 x daily - 7 x weekly - 1 sets - 3 reps - 30 hold - Supine Posterior Pelvic Tilt  - 1 x daily - 7 x weekly - 3 sets - 10 reps  ASSESSMENT:  CLINICAL IMPRESSION: Ms Leopard presents to skilled PT with continued overall similar levels of pain.  Pt did state that she could tolerate increased movement in aquatic PT than she could on land sessions.  Pt with good twitch response noted with dry needling. Pt continues to require skilled PT to progress towards goal related activities.    OBJECTIVE IMPAIRMENTS Abnormal gait, decreased strength, increased muscle spasms, impaired flexibility, postural dysfunction, and pain.   ACTIVITY LIMITATIONS carrying, lifting, bending, sitting, standing, squatting, sleeping, stairs, transfers, bed mobility, and dressing  PARTICIPATION LIMITATIONS: meal prep, cleaning, laundry, interpersonal relationship, driving, shopping, community activity, occupation, and yard work  PERSONAL FACTORS Fitness, Past/current experiences, Time since onset of injury/illness/exacerbation, and 1 comorbidity: PTSD, depression, anxiety  are also affecting patient's functional outcome.   REHAB POTENTIAL: Fair due to multiple injuries and deformities post surgery  CLINICAL DECISION MAKING:  Evolving/moderate complexity  EVALUATION COMPLEXITY: Moderate   GOALS: Goals reviewed with patient? Yes  SHORT TERM GOALS: Target date: 01/11/2022  Pain report to be no greater  than 5/10. Baseline: Goal status: INITIAL  2.  Patient will be independent with initial HEP  Baseline:  Goal status: IN PROGRESS  LONG TERM GOALS: Target date: 02/08/2022  Patient to be independent with advanced HEP  Baseline:  Goal status: INITIAL  2.  Patient to report pain no increase in pain while performing desired community tasks. Baseline:  Goal status: INITIAL  3.  FOTO to improve to 54% to demonstrate improvements in functional tasks. Baseline: 46% Goal status: INITIAL  4.  Lumbar ROM to improve to [facility] on all planes of motion Baseline:  Goal status: INITIAL  5.  Patient to be able to start a walking program and increase by 1/4 mile per session.   Baseline:  Goal status: INITIAL     PLAN: PT FREQUENCY: 2x/week  PT DURATION: 8 weeks  PLANNED INTERVENTIONS: Therapeutic exercises, Therapeutic activity, Neuromuscular re-education, Balance training, Gait training, Patient/Family education, Self Care, Joint mobilization, Joint manipulation, Aquatic Therapy, Dry Needling, Electrical stimulation, Spinal manipulation, Spinal mobilization, Cryotherapy, Moist heat, Taping, Traction, Ultrasound, Manual therapy, and Re-evaluation.  PLAN FOR NEXT SESSION: Assess response to dry needling, aquatic PT as indicated, review HEP, manual therapy/dry needling, as indicated    Juel Burrow, PT  12/27/2021, 11:03 AM   [facility] [address], [place], [place] [address] Phone # [phone] Fax [phone]

## 2021-12-29 ENCOUNTER — Encounter: Payer: Self-pay | Admitting: Rehabilitative and Restorative Service Providers"

## 2021-12-29 ENCOUNTER — Ambulatory Visit: Payer: Medicare Other | Admitting: Rehabilitative and Restorative Service Providers"

## 2021-12-29 DIAGNOSIS — M545 Low back pain, unspecified: Secondary | ICD-10-CM | POA: Diagnosis not present

## 2021-12-29 DIAGNOSIS — R293 Abnormal posture: Secondary | ICD-10-CM

## 2021-12-29 DIAGNOSIS — M6281 Muscle weakness (generalized): Secondary | ICD-10-CM

## 2021-12-29 DIAGNOSIS — R252 Cramp and spasm: Secondary | ICD-10-CM

## 2021-12-29 DIAGNOSIS — R262 Difficulty in walking, not elsewhere classified: Secondary | ICD-10-CM

## 2021-12-29 DIAGNOSIS — M5459 Other low back pain: Secondary | ICD-10-CM

## 2021-12-29 NOTE — Therapy (Signed)
OUTPATIENT PHYSICAL THERAPY TREATMENT NOTE    Patient Name: Lacey French MRN: [id number] DOB:20-Sep-1965, 56 y.o., female Today's Date: 12/29/2021   PT End of Session - 12/29/21 1101     Visit Number 5    Date for PT Re-Evaluation 02/07/22    Authorization Type Medicare    Progress Note Due on Visit 10    PT Start Time 1100    PT Stop Time 1140    PT Time Calculation (min) 40 min    Activity Tolerance Patient tolerated treatment well;Patient limited by pain    Behavior During Therapy WFL for tasks assessed/performed              Past Medical History:  Diagnosis Date   Allergy    Anxiety    Arthritis    Asthma    COPD (chronic obstructive pulmonary disease) ([place])    Depression    Hyperlipidemia    PTSD (post-traumatic stress disorder)    Past Surgical History:  Procedure Laterality Date   BREAST LUMPECTOMY Left    2, both benign   BREAST SURGERY     REDUCTION   COLONOSCOPY N/A 03/15/2021   Procedure: COLONOSCOPY;  Surgeon: Otis Brace, MD;  Location: [facility];  Service: Gastroenterology;  Laterality: N/A;  pt Allergic to Propofol   JOINT REPLACEMENT     L shoulder, L knee   POLYPECTOMY  03/15/2021   Procedure: POLYPECTOMY;  Surgeon: Otis Brace, MD;  Location: [facility];  Service: Gastroenterology;;   REDUCTION MAMMAPLASTY Bilateral    TONSILLECTOMY     Patient Active Problem List   Diagnosis Date Noted   Acute pain of left knee 11/03/2020   Acute left ankle pain 11/03/2020   Left hand pain 11/03/2020   Carpal tunnel syndrome of left wrist 05/16/2017   PTSD (post-traumatic stress disorder) 04/10/2016   Psychosis ([place]) 04/10/2016   Anxiety 04/10/2016   Allergy 04/10/2016   Asthma 04/10/2016   Depression 04/10/2016    PCP: Kathyrn Lass, MD  REFERRING PROVIDER: Inez Catalina, MD   REFERRING DIAG: M54.50 (ICD-10-CM) - Low back pain, unspecified   Rationale for Evaluation and Treatment Rehabilitation  THERAPY DIAG:  Other low  back pain  Muscle weakness (generalized)  Difficulty in walking, not elsewhere classified  Cramp and spasm  Abnormal posture  ONSET DATE: 12/07/2021  SUBJECTIVE:                                                                                                                                                                                           SUBJECTIVE STATEMENT: Patient reports that she has had some soreness since dry needling.  At this time, is feeling more relief from aquatic PT.  PERTINENT HISTORY:  Injured in [place] of [facility] on 9/11  PAIN:  Are you having pain? Yes: NPRS scale: 5-6/10 Pain location: low back Pain description: aching Aggravating factors: hurts all the time Relieving factors: nothing   PRECAUTIONS: None  WEIGHT BEARING RESTRICTIONS No  FALLS:  Has patient fallen in last 6 months? No  LIVING ENVIRONMENT: Lives with: lives with their family Lives in: House/apartment   OCCUPATION: disabled  PLOF: Independent, Independent with basic ADLs, Independent with household mobility without device, Independent with community mobility without device, Independent with homemaking with ambulation, Independent with gait, and Independent with transfers  PATIENT GOALS She hopes to gain relief for her back and knees.    OBJECTIVE:   DIAGNOSTIC FINDINGS:  None listed  PATIENT SURVEYS:  Eval:  FOTO  46% (projected 54% by visit 12)  SCREENING FOR RED FLAGS: Bowel or bladder incontinence: No Spinal tumors: No Cauda equina syndrome: No Compression fracture: No Abdominal aneurysm: No  COGNITION:  Overall cognitive status: Within functional limits for tasks assessed and emotional distress due to 9/11 events      SENSATION: WFL  MUSCLE LENGTH: Hamstrings: Right 60 deg; Left 50 deg Thomas test: Right pos ; Left pos   POSTURE: rounded shoulders and decreased lumbar lordosis  PALPATION: Crepitus bilateral knees  LUMBAR ROM:   Active  A/PROM   eval  Flexion Fingertips to mid shin  Extension WFL  Right lateral flexion Fingertips to just above joint line  Left lateral flexion Fingertips to just above joint line  Right rotation 50%  Left rotation 50%   (Blank rows = not tested)  LOWER EXTREMITY ROM:     WFL with exception of bilateral knee flexion limited to 100 to 120 degrees of flexion comfortably  LOWER EXTREMITY MMT:    Generally 4 to 4-/5 t/o bilateral LE's  LUMBAR SPECIAL TESTS:  Straight leg raise test: Negative  FUNCTIONAL TESTS:  Eval: 5 times sit to stand: complete next visit- time constraints Timed up and go (TUG): complete next visit  12/16/2021:  unable to complete secondary to increased pain.  GAIT: Distance walked: 50 Assistive device utilized: None Level of assistance: Complete Independence Comments: antalgic    TODAY'S TREATMENT: 12/29/2021: Nustep level  (seat at 8, blue machine) x7 min with PT present to discuss status Seated TA contraction 2x10 Prone on elbows x1 min Prone press-up 2x10 Quadruped cat/cow x10 Prone e-stim:  IFC to standard machine settings, adjusting intensity (up to 24) to tolerance with moist heat pack during e-stim.  X15 min Seated piriformis stretch with towel assist 2x20 sec   12/27/2021: Nustep level 1 (seat at 8, blue machine) x7 min with PT present to discuss status Seated green pball rollout 3x10 sec with "pulling" felt.  Verbal cues to only go to initial point of stretch Seated TA contraction gently pushing purple ball into thighs 2x10 Trigger Point Dry-Needling  Treatment instructions: Expect mild to moderate muscle soreness. S/S of pneumothorax if dry needled over a lung field, and to seek immediate medical attention should they occur. Patient verbalized understanding of these instructions and education. Patient Consent Given: Yes Education handout provided: Yes Muscles treated: bilateral thoracic and lumbar multifidi Electrical stimulation performed:  No Parameters: N/A Treatment response/outcome: Utilized skilled palpation to identify trigger points.  Able to palpate twitch response and muscle elongation following dry needling.  Manual Therapy:  Performed soft tissue mobilization and tissue gliding with silicone suction  cup following dry needling to encourage further tissue elongation.   12/21/21 Pt seen for aquatic therapy today.  Treatment took place in water 3.25-4.5 ft in depth at the [place]. Temp of water was 92.  Pt entered/exited the pool via steps with hand rail.  *Intro to setting *Walking in 4.48f forward; back and side stepping UE unsupported *Cycling on yellow noodle. Trialed add/abd not tolerated; skiing tolerated well. *"Hanging" on noodle for gentle lumbar distraction *seated on sm square noodle: pelvic ROM ant/post pelvic tilts, hip hiking and rotation.  Not tolerated well *L stretch 3x20s hold; L stretch with hip hiking R/L x4 *plank on 3rd step with hip extension x10 tolerated without difficulty Bad ragaz: for core elongation and relaxation. Lateral snaking with contract/relax technique. -above position: hip extension R/Lx4  Pt requires the buoyancy and hydrostatic pressure of water for support, and to offload joints by unweighting joint load by at least 50 % in navel deep water and by at least 75-80% in chest to neck deep water.  Viscosity of the water is needed for resistance of strengthening. Water current perturbations provides challenge to standing balance requiring increased core activation.     PATIENT EDUCATION:  Education details: Initiated HEP Person educated: Patient Education method: EConsulting civil engineer DMedia planner TCorporate treasurercues, Verbal cues, and Handouts Education comprehension: verbalized understanding, returned demonstration, verbal cues required, and needs further education   HOME EXERCISE PROGRAM: Access Code: [email or web address]: [email or web address] Date:  12/29/2021 Prepared by: SShelby DubinMenke  Exercises - Standing Hamstring Stretch on Chair  - 1 x daily - 7 x weekly - 1 sets - 3 reps - 30 hold - Supine Hamstring Stretch with Strap  - 1 x daily - 7 x weekly - 1 sets - 3 reps - 30 hold - Supine ITB Stretch with Strap  - 1 x daily - 7 x weekly - 1 sets - 3 reps - 30  hold - Supine Figure 4 Piriformis Stretch  - 1 x daily - 7 x weekly - 1 sets - 3 reps - 30 hold - Supine Posterior Pelvic Tilt  - 1 x daily - 7 x weekly - 3 sets - 10 reps - Supine Piriformis Stretch with Towel  - 1 x daily - 7 x weekly - 1 sets - 2 reps - 20 sec hold - Standing 'L' Stretch at Counter  - 1 x daily - 7 x weekly - 1 sets - 2 reps - 20 sec hold  ASSESSMENT:  CLINICAL IMPRESSION: Ms BWishonpresents to skilled PT reporting no difference with use of dry needling, so opted not to perform again.  Pt reports some difficulty with piriformis stretch, so provided a different option that did not involve as much twisting.  Pt did not report any change in pain with IFC e-stim and moist heat pack.  Pt states that she has not gotten set [place] so recommended pt find a MD to follow up with in case further diagnostic imaging is recommended.    OBJECTIVE IMPAIRMENTS Abnormal gait, decreased strength, increased muscle spasms, impaired flexibility, postural dysfunction, and pain.   ACTIVITY LIMITATIONS carrying, lifting, bending, sitting, standing, squatting, sleeping, stairs, transfers, bed mobility, and dressing  PARTICIPATION LIMITATIONS: meal prep, cleaning, laundry, interpersonal relationship, driving, shopping, community activity, occupation, and yard work  PERSONAL FACTORS Fitness, Past/current experiences, Time since onset of injury/illness/exacerbation, and 1 comorbidity: PTSD, depression, anxiety  are also affecting patient's functional outcome.   REHAB POTENTIAL: Fair  due to multiple injuries and deformities post surgery  CLINICAL  DECISION MAKING: Evolving/moderate complexity  EVALUATION COMPLEXITY: Moderate   GOALS: Goals reviewed with patient? Yes  SHORT TERM GOALS: Target date: 01/11/2022  Pain report to be no greater than 5/10. Baseline: Goal status: INITIAL  2.  Patient will be independent with initial HEP  Baseline:  Goal status: IN PROGRESS  LONG TERM GOALS: Target date: 02/08/2022  Patient to be independent with advanced HEP  Baseline:  Goal status: INITIAL  2.  Patient to report pain no increase in pain while performing desired community tasks. Baseline:  Goal status: INITIAL  3.  FOTO to improve to 54% to demonstrate improvements in functional tasks. Baseline: 46% Goal status: INITIAL  4.  Lumbar ROM to improve to [facility] on all planes of motion Baseline:  Goal status: INITIAL  5.  Patient to be able to start a walking program and increase by 1/4 mile per session.   Baseline:  Goal status: INITIAL     PLAN: PT FREQUENCY: 2x/week  PT DURATION: 8 weeks  PLANNED INTERVENTIONS: Therapeutic exercises, Therapeutic activity, Neuromuscular re-education, Balance training, Gait training, Patient/Family education, Self Care, Joint mobilization, Joint manipulation, Aquatic Therapy, Dry Needling, Electrical stimulation, Spinal manipulation, Spinal mobilization, Cryotherapy, Moist heat, Taping, Traction, Ultrasound, Manual therapy, and Re-evaluation.  PLAN FOR NEXT SESSION:  aquatic PT as indicated, review HEP, manual therapy/dry needling, as indicated    Juel Burrow, PT  12/29/2021, 12:36 PM   [facility] [address], [address] [place], [place] [address] Phone # [phone] Fax [phone]

## 2022-01-03 ENCOUNTER — Encounter: Payer: Medicare Other | Admitting: Rehabilitative and Restorative Service Providers"

## 2022-01-03 ENCOUNTER — Ambulatory Visit (HOSPITAL_BASED_OUTPATIENT_CLINIC_OR_DEPARTMENT_OTHER): Payer: Medicare Other | Admitting: Physical Therapy

## 2022-01-03 ENCOUNTER — Encounter (HOSPITAL_BASED_OUTPATIENT_CLINIC_OR_DEPARTMENT_OTHER): Payer: Self-pay | Admitting: Physical Therapy

## 2022-01-03 DIAGNOSIS — R252 Cramp and spasm: Secondary | ICD-10-CM

## 2022-01-03 DIAGNOSIS — M6281 Muscle weakness (generalized): Secondary | ICD-10-CM

## 2022-01-03 DIAGNOSIS — M545 Low back pain, unspecified: Secondary | ICD-10-CM | POA: Diagnosis not present

## 2022-01-03 DIAGNOSIS — R262 Difficulty in walking, not elsewhere classified: Secondary | ICD-10-CM

## 2022-01-03 DIAGNOSIS — M5459 Other low back pain: Secondary | ICD-10-CM

## 2022-01-03 NOTE — Therapy (Signed)
OUTPATIENT PHYSICAL THERAPY TREATMENT NOTE    Patient Name: Lacey French MRN: [id number] DOB:[birth year], 56 y.o., female Today's Date: 01/03/2022   PT End of Session - 01/03/22 0733     Visit Number 6    Date for PT Re-Evaluation 02/07/22    Authorization Type Medicare    Progress Note Due on Visit 10    PT Start Time 0731    PT Stop Time 0810    PT Time Calculation (min) 39 min    Activity Tolerance Patient tolerated treatment well;Patient limited by pain    Behavior During Therapy [facility] for tasks assessed/performed              Past Medical History:  Diagnosis Date   Allergy    Anxiety    Arthritis    Asthma    COPD (chronic obstructive pulmonary disease) ([place])    Depression    Hyperlipidemia    PTSD (post-traumatic stress disorder)    Past Surgical History:  Procedure Laterality Date   BREAST LUMPECTOMY Left    2, both benign   BREAST SURGERY     REDUCTION   COLONOSCOPY N/A 03/15/2021   Procedure: COLONOSCOPY;  Surgeon: Otis Brace, MD;  [facility];  Service: Gastroenterology;  Laterality: N/A;  pt Allergic to Propofol   JOINT REPLACEMENT     L shoulder, L knee   POLYPECTOMY  03/15/2021   Procedure: POLYPECTOMY;  Surgeon: Otis Brace, MD;  [facility];  Service: Gastroenterology;;   REDUCTION MAMMAPLASTY Bilateral    TONSILLECTOMY     Patient Active Problem List   Diagnosis Date Noted   Acute pain of left knee 11/03/2020   Acute left ankle pain 11/03/2020   Left hand pain 11/03/2020   Carpal tunnel syndrome of left wrist 05/16/2017   PTSD (post-traumatic stress disorder) 04/10/2016   Psychosis ([place]) 04/10/2016   Anxiety 04/10/2016   Allergy 04/10/2016   Asthma 04/10/2016   Depression 04/10/2016    PCP: Kathyrn Lass, MD  REFERRING PROVIDER: Inez Catalina, MD   REFERRING DIAG: M54.50 (ICD-10-CM) - Low back pain, unspecified   Rationale for Evaluation and Treatment Rehabilitation  THERAPY DIAG:  Other low  back pain  Muscle weakness (generalized)  Difficulty in walking, not elsewhere classified  Cramp and spasm  ONSET DATE: 12/07/2021  SUBJECTIVE:                                                                                                                                                                                           SUBJECTIVE STATEMENT: Patient reports she can't seem to get her back/ legs to unlock. She has  done foam rolling and stretching without much relief.   PERTINENT HISTORY:  Injured in [place] of [facility] on 9/11  PAIN:  Are you having pain? Yes: NPRS scale: 5-6/10 Pain location: low back Pain description: aching Aggravating factors: hurts all the time Relieving factors: nothing   PRECAUTIONS: None  WEIGHT BEARING RESTRICTIONS No  FALLS:  Has patient fallen in last 6 months? No  LIVING ENVIRONMENT: Lives with: lives with their family Lives in: House/apartment   OCCUPATION: disabled  PLOF: Independent, Independent with basic ADLs, Independent with household mobility without device, Independent with community mobility without device, Independent with homemaking with ambulation, Independent with gait, and Independent with transfers  PATIENT GOALS She hopes to gain relief for her back and knees.    OBJECTIVE:   DIAGNOSTIC FINDINGS:  None listed  PATIENT SURVEYS:  Eval:  FOTO  46% (projected 54% by visit 12)  SCREENING FOR RED FLAGS: Bowel or bladder incontinence: No Spinal tumors: No Cauda equina syndrome: No Compression fracture: No Abdominal aneurysm: No  COGNITION:  Overall cognitive status: Within functional limits for tasks assessed and emotional distress due to 9/11 events      SENSATION: WFL  MUSCLE LENGTH: Hamstrings: Right 60 deg; Left 50 deg Thomas test: Right pos ; Left pos   POSTURE: rounded shoulders and decreased lumbar lordosis  PALPATION: Crepitus bilateral knees  LUMBAR ROM:   Active  A/PROM  eval  Flexion  Fingertips to mid shin  Extension WFL  Right lateral flexion Fingertips to just above joint line  Left lateral flexion Fingertips to just above joint line  Right rotation 50%  Left rotation 50%   (Blank rows = not tested)  LOWER EXTREMITY ROM:     WFL with exception of bilateral knee flexion limited to 100 to 120 degrees of flexion comfortably  LOWER EXTREMITY MMT:    Generally 4 to 4-/5 t/o bilateral LE's  LUMBAR SPECIAL TESTS:  Straight leg raise test: Negative  FUNCTIONAL TESTS:  Eval: 5 times sit to stand: complete next visit- time constraints Timed up and go (TUG): complete next visit  12/16/2021:  unable to complete secondary to increased pain.  GAIT: Distance walked: 50 Assistive device utilized: None Level of assistance: Complete Independence Comments: antalgic    TODAY'S TREATMENT: 01/03/22 Pt seen for aquatic therapy today.  Treatment took place in water 3.25-4.5 ft in depth at the [place]. Temp of water was 92.  Pt entered/exited the pool independently via steps with hand rail.  * cycling on yellow noodle with breast stroke arms; cc ski (jumping jack not tolerated)   *"Hanging" on noodle for gentle lumbar distraction *Walking in 4.15f forward; back and side stepping UE unsupported * hip flexor stretch holding wall * plank with hands on bench with hip ext * discussed MFR with ball to psoas muscle (shown demonstration)  * holding wall: small hip circles; side to side lunge for adductor stretch; heels raises and squats * supported back float with nekdoodle, blue noodle under knees, yellow noodle under arms (small amount of snaking and knee to chest)  * pt stayed in deeper water after session continuing gentle ROM with LE   Pt requires the buoyancy and hydrostatic pressure of water for support, and to offload joints by unweighting joint load by at least 50 % in navel deep water and by at least 75-80% in chest to neck deep water.  Viscosity of  the water is needed for resistance of strengthening. Water current perturbations provides challenge  to standing balance requiring increased core activation.  12/29/2021: Nustep level  (seat at 8, blue machine) x7 min with PT present to discuss status Seated TA contraction 2x10 Prone on elbows x1 min Prone press-up 2x10 Quadruped cat/cow x10 Prone e-stim:  IFC to standard machine settings, adjusting intensity (up to 24) to tolerance with moist heat pack during e-stim.  X15 min Seated piriformis stretch with towel assist 2x20 sec   12/27/2021: Nustep level 1 (seat at 8, blue machine) x7 min with PT present to discuss status Seated green pball rollout 3x10 sec with "pulling" felt.  Verbal cues to only go to initial point of stretch Seated TA contraction gently pushing purple ball into thighs 2x10 Trigger Point Dry-Needling  Treatment instructions: Expect mild to moderate muscle soreness. S/S of pneumothorax if dry needled over a lung field, and to seek immediate medical attention should they occur. Patient verbalized understanding of these instructions and education. Patient Consent Given: Yes Education handout provided: Yes Muscles treated: bilateral thoracic and lumbar multifidi Electrical stimulation performed: No Parameters: N/A Treatment response/outcome: Utilized skilled palpation to identify trigger points.  Able to palpate twitch response and muscle elongation following dry needling.  Manual Therapy:  Performed soft tissue mobilization and tissue gliding with silicone suction cup following dry needling to encourage further tissue elongation.   12/21/21 Pt seen for aquatic therapy today.  Treatment took place in water 3.25-4.5 ft in depth at the [place]. Temp of water was 92.  Pt entered/exited the pool via steps with hand rail.  *Intro to setting *Walking in 4.52f forward; back and side stepping UE unsupported *Cycling on yellow noodle. Trialed add/abd not  tolerated; skiing tolerated well. *"Hanging" on noodle for gentle lumbar distraction *seated on sm square noodle: pelvic ROM ant/post pelvic tilts, hip hiking and rotation.  Not tolerated well *L stretch 3x20s hold; L stretch with hip hiking R/L x4 *plank on 3rd step with hip extension x10 tolerated without difficulty Bad ragaz: for core elongation and relaxation. Lateral snaking with contract/relax technique. -above position: hip extension R/Lx4  Pt requires the buoyancy and hydrostatic pressure of water for support, and to offload joints by unweighting joint load by at least 50 % in navel deep water and by at least 75-80% in chest to neck deep water.  Viscosity of the water is needed for resistance of strengthening. Water current perturbations provides challenge to standing balance requiring increased core activation.     PATIENT EDUCATION:  Education details: Aquatics progressions/modifications  Person educated: Patient Education method: Explanation, Demonstration, TCorporate treasurercues, Verbal cues, and Handouts Education comprehension: verbalized understanding, returned demonstration, verbal cues required, and needs further education   HOME EXERCISE PROGRAM: Access Code: [email or web address]: [email or web address] Date: 12/29/2021 Prepared by: SShelby DubinMenke  Exercises - Standing Hamstring Stretch on Chair  - 1 x daily - 7 x weekly - 1 sets - 3 reps - 30 hold - Supine Hamstring Stretch with Strap  - 1 x daily - 7 x weekly - 1 sets - 3 reps - 30 hold - Supine ITB Stretch with Strap  - 1 x daily - 7 x weekly - 1 sets - 3 reps - 30  hold - Supine Figure 4 Piriformis Stretch  - 1 x daily - 7 x weekly - 1 sets - 3 reps - 30 hold - Supine Posterior Pelvic Tilt  - 1 x daily - 7 x weekly - 3 sets - 10 reps - Supine Piriformis Stretch with Towel  - 1 x  daily - 7 x weekly - 1 sets - 2 reps - 20 sec hold - Standing 'L' Stretch at Counter  - 1 x daily - 7 x weekly - 1 sets - 2 reps - 20 sec  hold  ASSESSMENT:  CLINICAL IMPRESSION: Ms Springsteen's pain level remained at 6/10 throuughout session.  Hip abdct continues to be limited. Trialed standing hip flexor stretch in water with limited tolerance on L side; may benefit from gentle one on land as it appears her psoas tightness may be preventing her from standing more erect.  Position of comfort in water is with hips and knees flexed.     OBJECTIVE IMPAIRMENTS Abnormal gait, decreased strength, increased muscle spasms, impaired flexibility, postural dysfunction, and pain.   ACTIVITY LIMITATIONS carrying, lifting, bending, sitting, standing, squatting, sleeping, stairs, transfers, bed mobility, and dressing  PARTICIPATION LIMITATIONS: meal prep, cleaning, laundry, interpersonal relationship, driving, shopping, community activity, occupation, and yard work  PERSONAL FACTORS Fitness, Past/current experiences, Time since onset of injury/illness/exacerbation, and 1 comorbidity: PTSD, depression, anxiety  are also affecting patient's functional outcome.   REHAB POTENTIAL: Fair due to multiple injuries and deformities post surgery  CLINICAL DECISION MAKING: Evolving/moderate complexity  EVALUATION COMPLEXITY: Moderate   GOALS: Goals reviewed with patient? Yes  SHORT TERM GOALS: Target date: 01/11/2022  Pain report to be no greater than 5/10. Baseline: Goal status: INITIAL  2.  Patient will be independent with initial HEP  Baseline:  Goal status: IN PROGRESS  LONG TERM GOALS: Target date: 02/08/2022  Patient to be independent with advanced HEP  Baseline:  Goal status: INITIAL  2.  Patient to report pain no increase in pain while performing desired community tasks. Baseline:  Goal status: INITIAL  3.  FOTO to improve to 54% to demonstrate improvements in functional tasks. Baseline: 46% Goal status: INITIAL  4.  Lumbar ROM to improve to [facility] on all planes of motion Baseline:  Goal status: INITIAL  5.  Patient to be able  to start a walking program and increase by 1/4 mile per session.   Baseline:  Goal status: INITIAL     PLAN: PT FREQUENCY: 2x/week  PT DURATION: 8 weeks  PLANNED INTERVENTIONS: Therapeutic exercises, Therapeutic activity, Neuromuscular re-education, Balance training, Gait training, Patient/Family education, Self Care, Joint mobilization, Joint manipulation, Aquatic Therapy, Dry Needling, Electrical stimulation, Spinal manipulation, Spinal mobilization, Cryotherapy, Moist heat, Taping, Traction, Ultrasound, Manual therapy, and Re-evaluation.  PLAN FOR NEXT SESSION:  aquatic PT as indicated, review HEP, manual therapy/dry needling, as indicated   Kerin Perna, PTA 01/03/22 1:23 PM [facility] [address] [place], [place], [address] Phone: [phone]   Fax:  [phone]

## 2022-01-05 ENCOUNTER — Ambulatory Visit: Payer: Medicare Other | Attending: Sports Medicine

## 2022-01-05 DIAGNOSIS — M6281 Muscle weakness (generalized): Secondary | ICD-10-CM | POA: Diagnosis present

## 2022-01-05 DIAGNOSIS — R252 Cramp and spasm: Secondary | ICD-10-CM | POA: Diagnosis present

## 2022-01-05 DIAGNOSIS — R293 Abnormal posture: Secondary | ICD-10-CM | POA: Diagnosis present

## 2022-01-05 DIAGNOSIS — M5459 Other low back pain: Secondary | ICD-10-CM | POA: Diagnosis present

## 2022-01-05 DIAGNOSIS — R262 Difficulty in walking, not elsewhere classified: Secondary | ICD-10-CM | POA: Diagnosis present

## 2022-01-05 NOTE — Therapy (Signed)
OUTPATIENT PHYSICAL THERAPY TREATMENT NOTE    Patient Name: Lacey French MRN: [id number] DOB:[birth year], 56 y.o., female Today's Date: 01/05/2022   PT End of Session - 01/05/22 0844     Visit Number 7    Date for PT Re-Evaluation 02/07/22    Authorization Type Medicare    Progress Note Due on Visit 10    PT Start Time 0838    PT Stop Time 0936    PT Time Calculation (min) 58 min    Activity Tolerance Patient tolerated treatment well;Patient limited by pain    Behavior During Therapy [facility] for tasks assessed/performed              Past Medical History:  Diagnosis Date   Allergy    Anxiety    Arthritis    Asthma    COPD (chronic obstructive pulmonary disease) ([place])    Depression    Hyperlipidemia    PTSD (post-traumatic stress disorder)    Past Surgical History:  Procedure Laterality Date   BREAST LUMPECTOMY Left    2, both benign   BREAST SURGERY     REDUCTION   COLONOSCOPY N/A 03/15/2021   Procedure: COLONOSCOPY;  Surgeon: Otis Brace, MD;  Location: [facility];  Service: Gastroenterology;  Laterality: N/A;  pt Allergic to Propofol   JOINT REPLACEMENT     L shoulder, L knee   POLYPECTOMY  03/15/2021   Procedure: POLYPECTOMY;  Surgeon: Otis Brace, MD;  Location: [facility];  Service: Gastroenterology;;   REDUCTION MAMMAPLASTY Bilateral    TONSILLECTOMY     Patient Active Problem List   Diagnosis Date Noted   Acute pain of left knee 11/03/2020   Acute left ankle pain 11/03/2020   Left hand pain 11/03/2020   Carpal tunnel syndrome of left wrist 05/16/2017   PTSD (post-traumatic stress disorder) 04/10/2016   Psychosis ([place]) 04/10/2016   Anxiety 04/10/2016   Allergy 04/10/2016   Asthma 04/10/2016   Depression 04/10/2016    PCP: Kathyrn Lass, MD  REFERRING PROVIDER: Inez Catalina, MD   REFERRING DIAG: M54.50 (ICD-10-CM) - Low back pain, unspecified   Rationale for Evaluation and Treatment Rehabilitation  THERAPY DIAG:  Other low  back pain  Muscle weakness (generalized)  Difficulty in walking, not elsewhere classified  Cramp and spasm  Abnormal posture  ONSET DATE: 12/07/2021  SUBJECTIVE:                                                                                                                                                                                           SUBJECTIVE STATEMENT: Patient reports she continues to feel "locked up".  She rates  her pain at 5.5/10.  She explains that provider called in Rx for muscle relaxer and she will pick that up today.     PERTINENT HISTORY:  Injured in [place] [facility] on 9/11  PAIN:  Are you having pain? Yes: NPRS scale: 5-6/10 Pain location: low back Pain description: aching Aggravating factors: hurts all the time Relieving factors: nothing   PRECAUTIONS: None  WEIGHT BEARING RESTRICTIONS No  FALLS:  Has patient fallen in last 6 months? No  LIVING ENVIRONMENT: Lives with: lives with their family Lives in: House/apartment   OCCUPATION: disabled  PLOF: Independent, Independent with basic ADLs, Independent with household mobility without device, Independent with community mobility without device, Independent with homemaking with ambulation, Independent with gait, and Independent with transfers  PATIENT GOALS She hopes to gain relief for her back and knees.    OBJECTIVE:   DIAGNOSTIC FINDINGS:  None listed  PATIENT SURVEYS:  Eval:  FOTO  46% (projected 54% by visit 12)  SCREENING FOR RED FLAGS: Bowel or bladder incontinence: No Spinal tumors: No Cauda equina syndrome: No Compression fracture: No Abdominal aneurysm: No  COGNITION:  Overall cognitive status: Within functional limits for tasks assessed and emotional distress due to 9/11 events      SENSATION: WFL  MUSCLE LENGTH: Hamstrings: Right 60 deg; Left 50 deg Thomas test: Right pos ; Left pos   POSTURE: rounded shoulders and decreased lumbar  lordosis  PALPATION: Crepitus bilateral knees  LUMBAR ROM:   Active  A/PROM  eval  Flexion Fingertips to mid shin  Extension WFL  Right lateral flexion Fingertips to just above joint line  Left lateral flexion Fingertips to just above joint line  Right rotation 50%  Left rotation 50%   (Blank rows = not tested)  LOWER EXTREMITY ROM:     WFL with exception of bilateral knee flexion limited to 100 to 120 degrees of flexion comfortably  LOWER EXTREMITY MMT:    Generally 4 to 4-/5 t/o bilateral LE's  LUMBAR SPECIAL TESTS:  Straight leg raise test: Negative  FUNCTIONAL TESTS:  Eval: 5 times sit to stand: complete next visit- time constraints Timed up and go (TUG): complete next visit  12/16/2021:  unable to complete secondary to increased pain.  GAIT: Distance walked: 50 Assistive device utilized: None Level of assistance: Complete Independence Comments: antalgic    TODAY'S TREATMENT: 12/29/2021: Nustep level  (seat at 8, blue machine) x 5 min with PT present to discuss status Hook lying heel slides x 10 each leg Hook lying trunk rotations x 20 Supine TA contraction 2x10 Attempted supine hamstring stretch (patient struggles and shakes due to severe tightness) Supine short range SLR x 10 each LE Supine SKTC x 5 each leg holding 10 sec each Supine DKTC x 5 holding 10 sec each Prone lying x 2 min Prone on elbows x1 min Prone press-up x 10 Prone e-stim:  IFC to standard machine settings, adjusting intensity (up to 24) to tolerance with moist heat pack during e-stim.  X15 min     01/03/22: Pt seen for aquatic therapy today.  Treatment took place in water 3.25-4.5 ft in depth at the [facility]. Temp of water was 92.  Pt entered/exited the pool independently via steps with hand rail.  * cycling on yellow noodle with breast stroke arms; cc ski (jumping jack not tolerated)   *"Hanging" on noodle for gentle lumbar distraction *Walking in 4.47f forward;  back and side stepping UE unsupported * hip flexor stretch  holding wall * plank with hands on bench with hip ext * discussed MFR with ball to psoas muscle (shown demonstration)  * holding wall: small hip circles; side to side lunge for adductor stretch; heels raises and squats * supported back float with nekdoodle, blue noodle under knees, yellow noodle under arms (small amount of snaking and knee to chest)  * pt stayed in deeper water after session continuing gentle ROM with LE   Pt requires the buoyancy and hydrostatic pressure of water for support, and to offload joints by unweighting joint load by at least 50 % in navel deep water and by at least 75-80% in chest to neck deep water.  Viscosity of the water is needed for resistance of strengthening. Water current perturbations provides challenge to standing balance requiring increased core activation.  12/29/2021: Nustep level  (seat at 8, blue machine) x7 min with PT present to discuss status Seated TA contraction 2x10 Prone on elbows x1 min Prone press-up 2x10 Quadruped cat/cow x10 Prone e-stim:  IFC to standard machine settings, adjusting intensity (up to 24) to tolerance with moist heat pack during e-stim.  X15 min Seated piriformis stretch with towel assist 2x20 sec   12/27/2021: Nustep level 1 (seat at 8, blue machine) x7 min with PT present to discuss status Seated green pball rollout 3x10 sec with "pulling" felt.  Verbal cues to only go to initial point of stretch Seated TA contraction gently pushing purple ball into thighs 2x10 Trigger Point Dry-Needling  Treatment instructions: Expect mild to moderate muscle soreness. S/S of pneumothorax if dry needled over a lung field, and to seek immediate medical attention should they occur. Patient verbalized understanding of these instructions and education. Patient Consent Given: Yes Education handout provided: Yes Muscles treated: bilateral thoracic and lumbar multifidi Electrical  stimulation performed: No Parameters: N/A Treatment response/outcome: Utilized skilled palpation to identify trigger points.  Able to palpate twitch response and muscle elongation following dry needling.  Manual Therapy:  Performed soft tissue mobilization and tissue gliding with silicone suction cup following dry needling to encourage further tissue elongation.      PATIENT EDUCATION:  Education details: Aquatics progressions/modifications  Person educated: Patient Education method: Explanation, Demonstration, Corporate treasurer cues, Verbal cues, and Handouts Education comprehension: verbalized understanding, returned demonstration, verbal cues required, and needs further education   HOME EXERCISE PROGRAM: Access Code: [email or web address] URL: [email or web address] Date: 12/29/2021 Prepared by: Shelby Dubin Menke  Exercises - Standing Hamstring Stretch on Chair  - 1 x daily - 7 x weekly - 1 sets - 3 reps - 30 hold - Supine Hamstring Stretch with Strap  - 1 x daily - 7 x weekly - 1 sets - 3 reps - 30 hold - Supine ITB Stretch with Strap  - 1 x daily - 7 x weekly - 1 sets - 3 reps - 30  hold - Supine Figure 4 Piriformis Stretch  - 1 x daily - 7 x weekly - 1 sets - 3 reps - 30 hold - Supine Posterior Pelvic Tilt  - 1 x daily - 7 x weekly - 3 sets - 10 reps - Supine Piriformis Stretch with Towel  - 1 x daily - 7 x weekly - 1 sets - 2 reps - 20 sec hold - Standing 'L' Stretch at Counter  - 1 x daily - 7 x weekly - 1 sets - 2 reps - 20 sec hold  ASSESSMENT:  CLINICAL IMPRESSION: Ms Teater's pain level remains elevated and she is heavily guarded.  She  was able to complete all tasks today with moderate pain.  She has extremely tight hamstrings but is having trouble tolerating the stretches due to the severity of her low back spasms and guarding.  She would benefit from continuing skilled PT for LE flexibility, stretching for lumbar spine, core strengthening and spinal mobility.      OBJECTIVE  IMPAIRMENTS Abnormal gait, decreased strength, increased muscle spasms, impaired flexibility, postural dysfunction, and pain.   ACTIVITY LIMITATIONS carrying, lifting, bending, sitting, standing, squatting, sleeping, stairs, transfers, bed mobility, and dressing  PARTICIPATION LIMITATIONS: meal prep, cleaning, laundry, interpersonal relationship, driving, shopping, community activity, occupation, and yard work  PERSONAL FACTORS Fitness, Past/current experiences, Time since onset of injury/illness/exacerbation, and 1 comorbidity: PTSD, depression, anxiety  are also affecting patient's functional outcome.   REHAB POTENTIAL: Fair due to multiple injuries and deformities post surgery  CLINICAL DECISION MAKING: Evolving/moderate complexity  EVALUATION COMPLEXITY: Moderate   GOALS: Goals reviewed with patient? Yes  SHORT TERM GOALS: Target date: 01/11/2022  Pain report to be no greater than 5/10. Baseline: Goal status: INITIAL  2.  Patient will be independent with initial HEP  Baseline:  Goal status: IN PROGRESS  LONG TERM GOALS: Target date: 02/08/2022  Patient to be independent with advanced HEP  Baseline:  Goal status: INITIAL  2.  Patient to report pain no increase in pain while performing desired community tasks. Baseline:  Goal status: INITIAL  3.  FOTO to improve to 54% to demonstrate improvements in functional tasks. Baseline: 46% Goal status: INITIAL  4.  Lumbar ROM to improve to [facility] on all planes of motion Baseline:  Goal status: INITIAL  5.  Patient to be able to start a walking program and increase by 1/4 mile per session.   Baseline:  Goal status: INITIAL     PLAN: PT FREQUENCY: 2x/week  PT DURATION: 8 weeks  PLANNED INTERVENTIONS: Therapeutic exercises, Therapeutic activity, Neuromuscular re-education, Balance training, Gait training, Patient/Family education, Self Care, Joint mobilization, Joint manipulation, Aquatic Therapy, Dry Needling, Electrical  stimulation, Spinal manipulation, Spinal mobilization, Cryotherapy, Moist heat, Taping, Traction, Ultrasound, Manual therapy, and Re-evaluation.  PLAN FOR NEXT SESSION:  aquatic PT as indicated, review HEP, manual therapy/dry needling, as indicated   Samika Vetsch B. Makenli Derstine, PT 01/05/22 9:27 AM   [place] [address] [place], [place] [address] Phone # [phone] Fax [phone]

## 2022-01-10 ENCOUNTER — Ambulatory Visit (HOSPITAL_BASED_OUTPATIENT_CLINIC_OR_DEPARTMENT_OTHER): Payer: Medicare Other | Attending: Sports Medicine | Admitting: Physical Therapy

## 2022-01-10 ENCOUNTER — Encounter (HOSPITAL_BASED_OUTPATIENT_CLINIC_OR_DEPARTMENT_OTHER): Payer: Self-pay | Admitting: Physical Therapy

## 2022-01-10 DIAGNOSIS — M6281 Muscle weakness (generalized): Secondary | ICD-10-CM | POA: Diagnosis present

## 2022-01-10 DIAGNOSIS — R262 Difficulty in walking, not elsewhere classified: Secondary | ICD-10-CM | POA: Insufficient documentation

## 2022-01-10 DIAGNOSIS — R252 Cramp and spasm: Secondary | ICD-10-CM | POA: Insufficient documentation

## 2022-01-10 DIAGNOSIS — R293 Abnormal posture: Secondary | ICD-10-CM | POA: Insufficient documentation

## 2022-01-10 DIAGNOSIS — M5459 Other low back pain: Secondary | ICD-10-CM | POA: Diagnosis present

## 2022-01-10 NOTE — Therapy (Signed)
OUTPATIENT PHYSICAL THERAPY TREATMENT NOTE    Patient Name: Lacey French MRN: [id number] DOB:1966/02/06, 56 y.o., female Today's Date: 01/10/2022   PT End of Session - 01/10/22 1500     Visit Number 8    Date for PT Re-Evaluation 02/07/22    Authorization Type Medicare    Progress Note Due on Visit 10    PT Start Time 1445    PT Stop Time 1525    PT Time Calculation (min) 40 min    Activity Tolerance Patient limited by pain    Behavior During Therapy [facility] for tasks assessed/performed              Past Medical History:  Diagnosis Date   Allergy    Anxiety    Arthritis    Asthma    COPD (chronic obstructive pulmonary disease) ([place])    Depression    Hyperlipidemia    PTSD (post-traumatic stress disorder)    Past Surgical History:  Procedure Laterality Date   BREAST LUMPECTOMY Left    2, both benign   BREAST SURGERY     REDUCTION   COLONOSCOPY N/A 03/15/2021   Procedure: COLONOSCOPY;  Surgeon: Otis Brace, MD;  Location: [facility];  Service: Gastroenterology;  Laterality: N/A;  pt Allergic to Propofol   JOINT REPLACEMENT     L shoulder, L knee   POLYPECTOMY  03/15/2021   Procedure: POLYPECTOMY;  Surgeon: Otis Brace, MD;  Location: [facility];  Service: Gastroenterology;;   REDUCTION MAMMAPLASTY Bilateral    TONSILLECTOMY     Patient Active Problem List   Diagnosis Date Noted   Acute pain of left knee 11/03/2020   Acute left ankle pain 11/03/2020   Left hand pain 11/03/2020   Carpal tunnel syndrome of left wrist 05/16/2017   PTSD (post-traumatic stress disorder) 04/10/2016   Psychosis ([place]) 04/10/2016   Anxiety 04/10/2016   Allergy 04/10/2016   Asthma 04/10/2016   Depression 04/10/2016    PCP: Kathyrn Lass, MD  REFERRING PROVIDER: Inez Catalina, MD   REFERRING DIAG: M54.50 (ICD-10-CM) - Low back pain, unspecified   Rationale for Evaluation and Treatment Rehabilitation  THERAPY DIAG:  Other low back pain  Muscle weakness  (generalized)  Difficulty in walking, not elsewhere classified  Cramp and spasm  Abnormal posture  ONSET DATE: 12/07/2021  SUBJECTIVE:                                                                                                                                                                                           SUBJECTIVE STATEMENT: Patient reports she continues to feel "locked up". She started muscle relaxer last night;  no relief.  She feels she can do more in water than on land.   PERTINENT HISTORY:  Injured in [place] [facility] on 9/11  PAIN:  Are you having pain? Yes: NPRS scale: 6/10 Pain location: low back Pain description: aching Aggravating factors: hurts all the time Relieving factors: nothing   PRECAUTIONS: None  WEIGHT BEARING RESTRICTIONS No  FALLS:  Has patient fallen in last 6 months? No  LIVING ENVIRONMENT: Lives with: lives with their family Lives in: House/apartment   OCCUPATION: disabled  PLOF: Independent, Independent with basic ADLs, Independent with household mobility without device, Independent with community mobility without device, Independent with homemaking with ambulation, Independent with gait, and Independent with transfers  PATIENT GOALS She hopes to gain relief for her back and knees.    OBJECTIVE:   DIAGNOSTIC FINDINGS:  None listed  PATIENT SURVEYS:  Eval:  FOTO  46% (projected 54% by visit 12)  SCREENING FOR RED FLAGS: Bowel or bladder incontinence: No Spinal tumors: No Cauda equina syndrome: No Compression fracture: No Abdominal aneurysm: No  COGNITION:  Overall cognitive status: Within functional limits for tasks assessed and emotional distress due to 9/11 events      SENSATION: WFL  MUSCLE LENGTH: Hamstrings: Right 60 deg; Left 50 deg Thomas test: Right pos ; Left pos   POSTURE: rounded shoulders and decreased lumbar lordosis  PALPATION: Crepitus bilateral knees  LUMBAR ROM:   Active  A/PROM   eval  Flexion Fingertips to mid shin  Extension WFL  Right lateral flexion Fingertips to just above joint line  Left lateral flexion Fingertips to just above joint line  Right rotation 50%  Left rotation 50%   (Blank rows = not tested)  LOWER EXTREMITY ROM:     WFL with exception of bilateral knee flexion limited to 100 to 120 degrees of flexion comfortably  LOWER EXTREMITY MMT:    Generally 4 to 4-/5 t/o bilateral LE's  LUMBAR SPECIAL TESTS:  Straight leg raise test: Negative  FUNCTIONAL TESTS:  Eval: 5 times sit to stand: complete next visit- time constraints Timed up and go (TUG): complete next visit  12/16/2021:  unable to complete secondary to increased pain.  GAIT: Distance walked: 50 Assistive device utilized: None Level of assistance: Complete Independence Comments: antalgic    TODAY'S TREATMENT: 01/10/22: Pt seen for aquatic therapy today.  Treatment took place in water 3.25-4.5 ft in depth at the [place]. Temp of water was 92.  Pt entered/exited the pool independently via steps with hand rail.  In 4+ ft of water holding wall: - hip flexor stretch - hip abdct x 10 - marching  Holding yellow noodle - walking forward/ backward ; side stepping  Seated on bench with feet on blue step:  LAQ and seated hamstring stretch (limited range on LLE)   -supported back float with float belt and blue noodle under knees: bad ragaz with lateral stretches while passively moving pt through water   - after pt dried off:  applied reg Rock tape to L midback to lumbar paraspinals to increase proprioception and decrease sensitivity  Pt requires the buoyancy and hydrostatic pressure of water for support, and to offload joints by unweighting joint load by at least 50 % in navel deep water and by at least 75-80% in chest to neck deep water.  Viscosity of the water is needed for resistance of strengthening. Water current perturbations provides challenge to standing  balance requiring increased core activation.  12/29/2021: Nustep level  (  seat at 8, blue machine) x 5 min with PT present to discuss status Hook lying heel slides x 10 each leg Hook lying trunk rotations x 20 Supine TA contraction 2x10 Attempted supine hamstring stretch (patient struggles and shakes due to severe tightness) Supine short range SLR x 10 each LE Supine SKTC x 5 each leg holding 10 sec each Supine DKTC x 5 holding 10 sec each Prone lying x 2 min Prone on elbows x1 min Prone press-up x 10 Prone e-stim:  IFC to standard machine settings, adjusting intensity (up to 24) to tolerance with moist heat pack during e-stim.  X15 min     01/03/22: Pt seen for aquatic therapy today.  Treatment took place in water 3.25-4.5 ft in depth at the [place]. Temp of water was 92.  Pt entered/exited the pool independently via steps with hand rail.  * cycling on yellow noodle with breast stroke arms; cc ski (jumping jack not tolerated)   *"Hanging" on noodle for gentle lumbar distraction *Walking in 4.34f forward; back and side stepping UE unsupported * hip flexor stretch holding wall * plank with hands on bench with hip ext * discussed MFR with ball to psoas muscle (shown demonstration)  * holding wall: small hip circles; side to side lunge for adductor stretch; heels raises and squats * supported back float with nekdoodle, blue noodle under knees, yellow noodle under arms (small amount of snaking and knee to chest)  * pt stayed in deeper water after session continuing gentle ROM with LE   Pt requires the buoyancy and hydrostatic pressure of water for support, and to offload joints by unweighting joint load by at least 50 % in navel deep water and by at least 75-80% in chest to neck deep water.  Viscosity of the water is needed for resistance of strengthening. Water current perturbations provides challenge to standing balance requiring increased core  activation.  12/29/2021: Nustep level  (seat at 8, blue machine) x7 min with PT present to discuss status Seated TA contraction 2x10 Prone on elbows x1 min Prone press-up 2x10 Quadruped cat/cow x10 Prone e-stim:  IFC to standard machine settings, adjusting intensity (up to 24) to tolerance with moist heat pack during e-stim.  X15 min Seated piriformis stretch with towel assist 2x20 sec   12/27/2021: Nustep level 1 (seat at 8, blue machine) x7 min with PT present to discuss status Seated green pball rollout 3x10 sec with "pulling" felt.  Verbal cues to only go to initial point of stretch Seated TA contraction gently pushing purple ball into thighs 2x10 Trigger Point Dry-Needling  Treatment instructions: Expect mild to moderate muscle soreness. S/S of pneumothorax if dry needled over a lung field, and to seek immediate medical attention should they occur. Patient verbalized understanding of these instructions and education. Patient Consent Given: Yes Education handout provided: Yes Muscles treated: bilateral thoracic and lumbar multifidi Electrical stimulation performed: No Parameters: N/A Treatment response/outcome: Utilized skilled palpation to identify trigger points.  Able to palpate twitch response and muscle elongation following dry needling.  Manual Therapy:  Performed soft tissue mobilization and tissue gliding with silicone suction cup following dry needling to encourage further tissue elongation.      PATIENT EDUCATION:  Education details: Aquatics progressions/modifications  Person educated: Patient Education method: Explanation, Demonstration, [employer] treasurercues, Verbal cues, and Handouts Education comprehension: verbalized understanding, returned demonstration, verbal cues required, and needs further education   HOME EXERCISE PROGRAM: Access Code: [email or web address]: [email or web address] Date: 12/29/2021 Prepared by:  Eudora  Exercises - Standing  Hamstring Stretch on Chair  - 1 x daily - 7 x weekly - 1 sets - 3 reps - 30 hold - Supine Hamstring Stretch with Strap  - 1 x daily - 7 x weekly - 1 sets - 3 reps - 30 hold - Supine ITB Stretch with Strap  - 1 x daily - 7 x weekly - 1 sets - 3 reps - 30  hold - Supine Figure 4 Piriformis Stretch  - 1 x daily - 7 x weekly - 1 sets - 3 reps - 30 hold - Supine Posterior Pelvic Tilt  - 1 x daily - 7 x weekly - 3 sets - 10 reps - Supine Piriformis Stretch with Towel  - 1 x daily - 7 x weekly - 1 sets - 2 reps - 20 sec hold - Standing 'L' Stretch at Counter  - 1 x daily - 7 x weekly - 1 sets - 2 reps - 20 sec hold  ASSESSMENT:  CLINICAL IMPRESSION: Continued limited tolerance for exercises despite submerged 80%.  Pt remains heavily guarded; however she remains motivated to progress towards goals and is willing to try any exercise or modality to help improve her mobility. Light stretching to L hamstring was performed without increase in back spasms.  Encouraged pt to trial trekking pole in the house to see if offloading LLE helps bring her pain level down.  She would benefit from continuing skilled PT for LE flexibility, stretching for lumbar spine, core strengthening and spinal mobility.      OBJECTIVE IMPAIRMENTS Abnormal gait, decreased strength, increased muscle spasms, impaired flexibility, postural dysfunction, and pain.   ACTIVITY LIMITATIONS carrying, lifting, bending, sitting, standing, squatting, sleeping, stairs, transfers, bed mobility, and dressing  PARTICIPATION LIMITATIONS: meal prep, cleaning, laundry, interpersonal relationship, driving, shopping, community activity, occupation, and yard work  PERSONAL FACTORS Fitness, Past/current experiences, Time since onset of injury/illness/exacerbation, and 1 comorbidity: PTSD, depression, anxiety  are also affecting patient's functional outcome.   REHAB POTENTIAL: Fair due to multiple injuries and deformities post surgery  CLINICAL DECISION  MAKING: Evolving/moderate complexity  EVALUATION COMPLEXITY: Moderate   GOALS: Goals reviewed with patient? Yes  SHORT TERM GOALS: Target date: 01/11/2022  Pain report to be no greater than 5/10. Baseline: Goal status: INITIAL  2.  Patient will be independent with initial HEP  Baseline:  Goal status: IN PROGRESS  LONG TERM GOALS: Target date: 02/08/2022  Patient to be independent with advanced HEP  Baseline:  Goal status: INITIAL  2.  Patient to report pain no increase in pain while performing desired community tasks. Baseline:  Goal status: INITIAL  3.  FOTO to improve to 54% to demonstrate improvements in functional tasks. Baseline: 46% Goal status: INITIAL  4.  Lumbar ROM to improve to [facility] on all planes of motion Baseline:  Goal status: INITIAL  5.  Patient to be able to start a walking program and increase by 1/4 mile per session.   Baseline:  Goal status: INITIAL     PLAN: PT FREQUENCY: 2x/week  PT DURATION: 8 weeks  PLANNED INTERVENTIONS: Therapeutic exercises, Therapeutic activity, Neuromuscular re-education, Balance training, Gait training, Patient/Family education, Self Care, Joint mobilization, Joint manipulation, Aquatic Therapy, Dry Needling, Electrical stimulation, Spinal manipulation, Spinal mobilization, Cryotherapy, Moist heat, Taping, Traction, Ultrasound, Manual therapy, and Re-evaluation.  PLAN FOR NEXT SESSION:  aquatic PT as indicated, review HEP, manual therapy/dry needling, as indicated  - assess response to tape.  Kerin Perna, PTA 01/10/22  6:08 PM [facility] [address] [place], [place], [address] Phone: [phone]   Fax:  [phone]

## 2022-01-13 ENCOUNTER — Ambulatory Visit: Payer: Medicare Other

## 2022-01-13 DIAGNOSIS — R293 Abnormal posture: Secondary | ICD-10-CM

## 2022-01-13 DIAGNOSIS — M5459 Other low back pain: Secondary | ICD-10-CM

## 2022-01-13 DIAGNOSIS — R252 Cramp and spasm: Secondary | ICD-10-CM

## 2022-01-13 DIAGNOSIS — R262 Difficulty in walking, not elsewhere classified: Secondary | ICD-10-CM

## 2022-01-13 DIAGNOSIS — M6281 Muscle weakness (generalized): Secondary | ICD-10-CM

## 2022-01-13 NOTE — Therapy (Signed)
OUTPATIENT PHYSICAL THERAPY TREATMENT NOTE    Patient Name: Lacey French MRN: [id number] DOB:1965/08/16, 56 y.o., female Today's Date: 01/14/2022   PT End of Session - 01/13/22 1958     Visit Number 9    Date for PT Re-Evaluation 02/07/22    Authorization Type Medicare    PT Start Time 0930    PT Stop Time 1015    PT Time Calculation (min) 45 min    Activity Tolerance Patient limited by pain    Behavior During Therapy [facility] for tasks assessed/performed               Past Medical History:  Diagnosis Date   Allergy    Anxiety    Arthritis    Asthma    COPD (chronic obstructive pulmonary disease) ([place])    Depression    Hyperlipidemia    PTSD (post-traumatic stress disorder)    Past Surgical History:  Procedure Laterality Date   BREAST LUMPECTOMY Left    2, both benign   BREAST SURGERY     REDUCTION   COLONOSCOPY N/A 03/15/2021   Procedure: COLONOSCOPY;  Surgeon: Otis Brace, MD;  Location: [facility];  Service: Gastroenterology;  Laterality: N/A;  pt Allergic to Propofol   JOINT REPLACEMENT     L shoulder, L knee   POLYPECTOMY  03/15/2021   Procedure: POLYPECTOMY;  Surgeon: Otis Brace, MD;  Location: [facility];  Service: Gastroenterology;;   REDUCTION MAMMAPLASTY Bilateral    TONSILLECTOMY     Patient Active Problem List   Diagnosis Date Noted   Acute pain of left knee 11/03/2020   Acute left ankle pain 11/03/2020   Left hand pain 11/03/2020   Carpal tunnel syndrome of left wrist 05/16/2017   PTSD (post-traumatic stress disorder) 04/10/2016   Psychosis ([facility]) 04/10/2016   Anxiety 04/10/2016   Allergy 04/10/2016   Asthma 04/10/2016   Depression 04/10/2016    PCP: Kathyrn Lass, MD  REFERRING PROVIDER: Inez Catalina, MD   REFERRING DIAG: M54.50 (ICD-10-CM) - Low back pain, unspecified   Rationale for Evaluation and Treatment Rehabilitation  THERAPY DIAG:  Other low back pain  Muscle weakness (generalized)  Difficulty in  walking, not elsewhere classified  Cramp and spasm  Abnormal posture  ONSET DATE: 12/07/2021  SUBJECTIVE:                                                                                                                                                                                           SUBJECTIVE STATEMENT: Patient reports she continues to feel "locked up". She started muscle relaxer last night; no relief.  She feels she can do  more in water than on land.   PERTINENT HISTORY:  Injured in [place] [facility] on 9/11  PAIN:  Are you having pain? Yes: NPRS scale: 6/10 Pain location: low back Pain description: aching Aggravating factors: hurts all the time Relieving factors: nothing   PRECAUTIONS: None  WEIGHT BEARING RESTRICTIONS No  FALLS:  Has patient fallen in last 6 months? No  LIVING ENVIRONMENT: Lives with: lives with their family Lives in: House/apartment   OCCUPATION: disabled  PLOF: Independent, Independent with basic ADLs, Independent with household mobility without device, Independent with community mobility without device, Independent with homemaking with ambulation, Independent with gait, and Independent with transfers  PATIENT GOALS She hopes to gain relief for her back and knees.    OBJECTIVE:   DIAGNOSTIC FINDINGS:  None listed  PATIENT SURVEYS:  Eval:  FOTO  46% (projected 54% by visit 12)  SCREENING FOR RED FLAGS: Bowel or bladder incontinence: No Spinal tumors: No Cauda equina syndrome: No Compression fracture: No Abdominal aneurysm: No  COGNITION:  Overall cognitive status: Within functional limits for tasks assessed and emotional distress due to 9/11 events      SENSATION: WFL  MUSCLE LENGTH: Hamstrings: Right 60 deg; Left 50 deg Thomas test: Right pos ; Left pos   POSTURE: rounded shoulders and decreased lumbar lordosis  PALPATION: Crepitus bilateral knees  LUMBAR ROM:   Active  A/PROM  eval  Flexion Fingertips to mid  shin  Extension WFL  Right lateral flexion Fingertips to just above joint line  Left lateral flexion Fingertips to just above joint line  Right rotation 50%  Left rotation 50%   (Blank rows = not tested)  LOWER EXTREMITY ROM:     WFL with exception of bilateral knee flexion limited to 100 to 120 degrees of flexion comfortably  LOWER EXTREMITY MMT:    Generally 4 to 4-/5 t/o bilateral LE's  LUMBAR SPECIAL TESTS:  Straight leg raise test: Negative  FUNCTIONAL TESTS:  Eval: 5 times sit to stand: complete next visit- time constraints Timed up and go (TUG): complete next visit  12/16/2021:  unable to complete secondary to increased pain.  GAIT: Distance walked: 50 Assistive device utilized: None Level of assistance: Complete Independence Comments: antalgic    TODAY'S TREATMENT: 01/13/2022: Nustep level  (seat at 8, blue machine) x 6 min with PT present to discuss status Prone lying x 2 min Prone on elbows x 2 min Prone press-up x10 Addaday to lumbar paraspinals, low back, gluts, IT bands, and hamstrings bilaterally x 8 min Quadruped cat/cow x10 PPT x 20 PPT with 90/90 heel tap (patient had to do toe tap) x 20 PPT with dying bug Trunk rotation x 20 Prone e-stim:  IFC to standard machine settings, adjusting intensity (up to 24) to tolerance with moist heat pack during e-stim.  X15 min (offered: patient declined saying she can do this at home) Seated piriformis stretch with towel assist 2x20 sec  01/10/22: Pt seen for aquatic therapy today.  Treatment took place in water 3.25-4.5 ft in depth at the [place]. Temp of water was 92.  Pt entered/exited the pool independently via steps with hand rail.  In 4+ ft of water holding wall: - hip flexor stretch - hip abdct x 10 - marching  Holding yellow noodle - walking forward/ backward ; side stepping  Seated on bench with feet on blue step:  LAQ and seated hamstring stretch (limited range on LLE)    -supported back float with float  belt and blue noodle under knees: bad ragaz with lateral stretches while passively moving pt through water   - after pt dried off:  applied reg Rock tape to L midback to lumbar paraspinals to increase proprioception and decrease sensitivity  Pt requires the buoyancy and hydrostatic pressure of water for support, and to offload joints by unweighting joint load by at least 50 % in navel deep water and by at least 75-80% in chest to neck deep water.  Viscosity of the water is needed for resistance of strengthening. Water current perturbations provides challenge to standing balance requiring increased core activation.      01/03/22: Pt seen for aquatic therapy today.  Treatment took place in water 3.25-4.5 ft in depth at the [place]. Temp of water was 92.  Pt entered/exited the pool independently via steps with hand rail.  * cycling on yellow noodle with breast stroke arms; cc ski (jumping jack not tolerated)   *"Hanging" on noodle for gentle lumbar distraction *Walking in 4.25f forward; back and side stepping UE unsupported * hip flexor stretch holding wall * plank with hands on bench with hip ext * discussed MFR with ball to psoas muscle (shown demonstration)  * holding wall: small hip circles; side to side lunge for adductor stretch; heels raises and squats * supported back float with nekdoodle, blue noodle under knees, yellow noodle under arms (small amount of snaking and knee to chest)  * pt stayed in deeper water after session continuing gentle ROM with LE   Pt requires the buoyancy and hydrostatic pressure of water for support, and to offload joints by unweighting joint load by at least 50 % in navel deep water and by at least 75-80% in chest to neck deep water.  Viscosity of the water is needed for resistance of strengthening. Water current perturbations provides challenge to standing balance requiring increased core  activation.   PATIENT EDUCATION:  Education details: Aquatics progressions/modifications  Person educated: Patient Education method: Explanation, Demonstration, TCorporate treasurercues, Verbal cues, and Handouts Education comprehension: verbalized understanding, returned demonstration, verbal cues required, and needs further education   HOME EXERCISE PROGRAM: Access Code: [email or web address]: [email or web address] Date: 12/29/2021 Prepared by: SShelby DubinMenke  Exercises - Standing Hamstring Stretch on Chair  - 1 x daily - 7 x weekly - 1 sets - 3 reps - 30 hold - Supine Hamstring Stretch with Strap  - 1 x daily - 7 x weekly - 1 sets - 3 reps - 30 hold - Supine ITB Stretch with Strap  - 1 x daily - 7 x weekly - 1 sets - 3 reps - 30  hold - Supine Figure 4 Piriformis Stretch  - 1 x daily - 7 x weekly - 1 sets - 3 reps - 30 hold - Supine Posterior Pelvic Tilt  - 1 x daily - 7 x weekly - 3 sets - 10 reps - Supine Piriformis Stretch with Towel  - 1 x daily - 7 x weekly - 1 sets - 2 reps - 20 sec hold - Standing 'L' Stretch at Counter  - 1 x daily - 7 x weekly - 1 sets - 2 reps - 20 sec hold  ASSESSMENT:  CLINICAL IMPRESSION: DTameyacontinues to report elevated levels of pain.  She completed several more advanced core exercises and responded well to theragun.   She would benefit from continuing skilled PT for LE flexibility, stretching for lumbar spine, core strengthening and spinal mobility.      OBJECTIVE  IMPAIRMENTS Abnormal gait, decreased strength, increased muscle spasms, impaired flexibility, postural dysfunction, and pain.   ACTIVITY LIMITATIONS carrying, lifting, bending, sitting, standing, squatting, sleeping, stairs, transfers, bed mobility, and dressing  PARTICIPATION LIMITATIONS: meal prep, cleaning, laundry, interpersonal relationship, driving, shopping, community activity, occupation, and yard work  PERSONAL FACTORS Fitness, Past/current experiences, Time since onset of  injury/illness/exacerbation, and 1 comorbidity: PTSD, depression, anxiety  are also affecting patient's functional outcome.   REHAB POTENTIAL: Fair due to multiple injuries and deformities post surgery  CLINICAL DECISION MAKING: Evolving/moderate complexity  EVALUATION COMPLEXITY: Moderate   GOALS: Goals reviewed with patient? Yes  SHORT TERM GOALS: Target date: 01/11/2022  Pain report to be no greater than 5/10. Baseline: Goal status: INITIAL  2.  Patient will be independent with initial HEP  Baseline:  Goal status: IN PROGRESS  LONG TERM GOALS: Target date: 02/08/2022  Patient to be independent with advanced HEP  Baseline:  Goal status: INITIAL  2.  Patient to report pain no increase in pain while performing desired community tasks. Baseline:  Goal status: INITIAL  3.  FOTO to improve to 54% to demonstrate improvements in functional tasks. Baseline: 46% Goal status: INITIAL  4.  Lumbar ROM to improve to [facility] on all planes of motion Baseline:  Goal status: INITIAL  5.  Patient to be able to start a walking program and increase by 1/4 mile per session.   Baseline:  Goal status: INITIAL     PLAN: PT FREQUENCY: 2x/week  PT DURATION: 8 weeks  PLANNED INTERVENTIONS: Therapeutic exercises, Therapeutic activity, Neuromuscular re-education, Balance training, Gait training, Patient/Family education, Self Care, Joint mobilization, Joint manipulation, Aquatic Therapy, Dry Needling, Electrical stimulation, Spinal manipulation, Spinal mobilization, Cryotherapy, Moist heat, Taping, Traction, Ultrasound, Manual therapy, and Re-evaluation.  PLAN FOR NEXT SESSION:  aquatic PT as indicated, review HEP, manual therapy/dry needling, as indicated  - assess response to tape.   Anderson Malta B. Jaion Lagrange, PT 01/14/22 8:10 PM

## 2022-01-17 ENCOUNTER — Ambulatory Visit (HOSPITAL_BASED_OUTPATIENT_CLINIC_OR_DEPARTMENT_OTHER): Payer: Medicare Other | Admitting: Physical Therapy

## 2022-01-17 ENCOUNTER — Encounter (HOSPITAL_BASED_OUTPATIENT_CLINIC_OR_DEPARTMENT_OTHER): Payer: Self-pay | Admitting: Physical Therapy

## 2022-01-17 DIAGNOSIS — R252 Cramp and spasm: Secondary | ICD-10-CM

## 2022-01-17 DIAGNOSIS — R262 Difficulty in walking, not elsewhere classified: Secondary | ICD-10-CM

## 2022-01-17 DIAGNOSIS — M5459 Other low back pain: Secondary | ICD-10-CM | POA: Diagnosis not present

## 2022-01-17 DIAGNOSIS — R293 Abnormal posture: Secondary | ICD-10-CM

## 2022-01-17 DIAGNOSIS — M6281 Muscle weakness (generalized): Secondary | ICD-10-CM

## 2022-01-17 NOTE — Therapy (Signed)
OUTPATIENT PHYSICAL THERAPY TREATMENT NOTE    Patient Name: Lacey French MRN: [id number] DOB:[birth year], 56 y.o., female Today's Date: 01/17/2022   PT End of Session - 01/17/22 1754     Visit Number 10    Date for PT Re-Evaluation 02/07/22    Authorization Type Medicare    PT Start Time 1530    PT Stop Time 1612    PT Time Calculation (min) 42 min    Activity Tolerance Patient limited by pain    Behavior During Therapy [facility] for tasks assessed/performed                Past Medical History:  Diagnosis Date   Allergy    Anxiety    Arthritis    Asthma    COPD (chronic obstructive pulmonary disease) ([place])    Depression    Hyperlipidemia    PTSD (post-traumatic stress disorder)    Past Surgical History:  Procedure Laterality Date   BREAST LUMPECTOMY Left    2, both benign   BREAST SURGERY     REDUCTION   COLONOSCOPY N/A 03/15/2021   Procedure: COLONOSCOPY;  Surgeon: Otis Brace, MD;  Location: [facility];  Service: Gastroenterology;  Laterality: N/A;  pt Allergic to Propofol   JOINT REPLACEMENT     L shoulder, L knee   POLYPECTOMY  03/15/2021   Procedure: POLYPECTOMY;  Surgeon: Otis Brace, MD;  [facility]: [facility];  Service: Gastroenterology;;   REDUCTION MAMMAPLASTY Bilateral    TONSILLECTOMY     Patient Active Problem List   Diagnosis Date Noted   Acute pain of left knee 11/03/2020   Acute left ankle pain 11/03/2020   Left hand pain 11/03/2020   Carpal tunnel syndrome of left wrist 05/16/2017   PTSD (post-traumatic stress disorder) 04/10/2016   Psychosis ([place]) 04/10/2016   Anxiety 04/10/2016   Allergy 04/10/2016   Asthma 04/10/2016   Depression 04/10/2016    PCP: Kathyrn Lass, MD  REFERRING PROVIDER: Inez Catalina, MD   REFERRING DIAG: M54.50 (ICD-10-CM) - Low back pain, unspecified   Rationale for Evaluation and Treatment Rehabilitation  THERAPY DIAG:  Other low back pain  Muscle weakness (generalized)  Difficulty in  walking, not elsewhere classified  Cramp and spasm  Abnormal posture  ONSET DATE: 12/07/2021  SUBJECTIVE:                                                                                                                                                                                           SUBJECTIVE STATEMENT: Pt reports that the kinesiology tape has "taken some strain off" of back.  She reports she was retaped at last  land appt.   PERTINENT HISTORY:  Injured in [place] [facility] on 9/11  PAIN:  Are you having pain? Yes: NPRS scale: 5/10 Pain location: low back Pain description: aching Aggravating factors: hurts all the time Relieving factors: nothing   PRECAUTIONS: None  WEIGHT BEARING RESTRICTIONS No  FALLS:  Has patient fallen in last 6 months? No  LIVING ENVIRONMENT: Lives with: lives with their family Lives in: House/apartment   OCCUPATION: disabled  PLOF: Independent, Independent with basic ADLs, Independent with household mobility without device, Independent with community mobility without device, Independent with homemaking with ambulation, Independent with gait, and Independent with transfers  PATIENT GOALS She hopes to gain relief for her back and knees.    OBJECTIVE:   DIAGNOSTIC FINDINGS:  None listed  PATIENT SURVEYS:  Eval:  FOTO  46% (projected 54% by visit 12)  SCREENING FOR RED FLAGS: Bowel or bladder incontinence: No Spinal tumors: No Cauda equina syndrome: No Compression fracture: No Abdominal aneurysm: No  COGNITION:  Overall cognitive status: Within functional limits for tasks assessed and emotional distress due to 9/11 events      SENSATION: WFL  MUSCLE LENGTH: Hamstrings: Right 60 deg; Left 50 deg Thomas test: Right pos ; Left pos   POSTURE: rounded shoulders and decreased lumbar lordosis  PALPATION: Crepitus bilateral knees  LUMBAR ROM:   Active  A/PROM  eval  Flexion Fingertips to mid shin  Extension WFL  Right  lateral flexion Fingertips to just above joint line  Left lateral flexion Fingertips to just above joint line  Right rotation 50%  Left rotation 50%   (Blank rows = not tested)  LOWER EXTREMITY ROM:     WFL with exception of bilateral knee flexion limited to 100 to 120 degrees of flexion comfortably  LOWER EXTREMITY MMT:    Generally 4 to 4-/5 t/o bilateral LE's  LUMBAR SPECIAL TESTS:  Straight leg raise test: Negative  FUNCTIONAL TESTS:  Eval: 5 times sit to stand: time contraints - not completed Timed up and go (TUG): not completed  12/16/2021:  unable to complete secondary to increased pain.  GAIT: Distance walked: 50 Assistive device utilized: None Level of assistance: Complete Independence Comments: antalgic    TODAY'S TREATMENT: 01/17/22: Pt seen for aquatic therapy today.  Treatment took place in water 3.25-4.5 ft in depth at the [place]. Temp of water was 92.  Pt entered/exited the pool independently via steps with hand rail.  In 4+ ft of water holding wall: - with yellow noodle under arms:  small knee tucks; small pendulums (R/L - increase pain with hips to R) - holding yellow noodle: forward / backward gait (small steps); side stepping (increased pain with R side stepping) ; forward walking kicks (LAQ) - suspended with yellow noodle under arms for decompression - TrA set with short blue noodle pull down -  high knee marching   - plank with hands on bench in water; with hip ext   - return to plank position with noodle under arms    Pt requires the buoyancy and hydrostatic pressure of water for support, and to offload joints by unweighting joint load by at least 50 % in navel deep water and by at least 75-80% in chest to neck deep water.  Viscosity of the water is needed for resistance of strengthening. Water current perturbations provides challenge to standing balance requiring increased core activation.  01/13/2022: Nustep level  (seat at  8, blue machine) x 6 min with PT  present to discuss status Prone lying x 2 min Prone on elbows x 2 min Prone press-up x10 Addaday to lumbar paraspinals, low back, gluts, IT bands, and hamstrings bilaterally x 8 min Quadruped cat/cow x10 PPT x 20 PPT with 90/90 heel tap (patient had to do toe tap) x 20 PPT with dying bug Trunk rotation x 20 Prone e-stim:  IFC to standard machine settings, adjusting intensity (up to 24) to tolerance with moist heat pack during e-stim.  X15 min (offered: patient declined saying she can do this at home) Seated piriformis stretch with towel assist 2x20 sec  01/10/22: Pt seen for aquatic therapy today.  Treatment took place in water 3.25-4.5 ft in depth at the [place]. Temp of water was 92.  Pt entered/exited the pool independently via steps with hand rail.  In 4+ ft of water holding wall: - hip flexor stretch - hip abdct x 10 - marching  Holding yellow noodle - walking forward/ backward ; side stepping  Seated on bench with feet on blue step:  LAQ and seated hamstring stretch (limited range on LLE)   -supported back float with float belt and blue noodle under knees: bad ragaz with lateral stretches while passively moving pt through water   - after pt dried off:  applied reg Rock tape to L midback to lumbar paraspinals to increase proprioception and decrease sensitivity  Pt requires the buoyancy and hydrostatic pressure of water for support, and to offload joints by unweighting joint load by at least 50 % in navel deep water and by at least 75-80% in chest to neck deep water.  Viscosity of the water is needed for resistance of strengthening. Water current perturbations provides challenge to standing balance requiring increased core activation.    PATIENT EDUCATION:  Education details: Aquatics progressions/modifications  Person educated: Patient Education method: Explanation, Demonstration, Corporate treasurer cues, Verbal cues, and  Handouts Education comprehension: verbalized understanding, returned demonstration, verbal cues required, and needs further education   HOME EXERCISE PROGRAM: Access Code: [email or web address] URL: [email or web address] Date: 12/29/2021 Prepared by: Shelby Dubin Menke  Exercises - Standing Hamstring Stretch on Chair  - 1 x daily - 7 x weekly - 1 sets - 3 reps - 30 hold - Supine Hamstring Stretch with Strap  - 1 x daily - 7 x weekly - 1 sets - 3 reps - 30 hold - Supine ITB Stretch with Strap  - 1 x daily - 7 x weekly - 1 sets - 3 reps - 30  hold - Supine Figure 4 Piriformis Stretch  - 1 x daily - 7 x weekly - 1 sets - 3 reps - 30 hold - Supine Posterior Pelvic Tilt  - 1 x daily - 7 x weekly - 3 sets - 10 reps - Supine Piriformis Stretch with Towel  - 1 x daily - 7 x weekly - 1 sets - 2 reps - 20 sec hold - Standing 'L' Stretch at Counter  - 1 x daily - 7 x weekly - 1 sets - 2 reps - 20 sec hold  ASSESSMENT:  CLINICAL IMPRESSION: Pt reports she has significantly less pressure (but unchanged pain) when suspended in neutral position with hips slightly flexed.  She did not tolerate sitting on bench with body submerged 70%; this spiked her pain. She was able to move better in water today after using hot tub prior to session. Tape held today; she wanted to have area available to use TENS at home.    She would benefit  from continuing skilled PT for LE flexibility, stretching for lumbar spine, core strengthening and spinal mobility.      OBJECTIVE IMPAIRMENTS Abnormal gait, decreased strength, increased muscle spasms, impaired flexibility, postural dysfunction, and pain.   ACTIVITY LIMITATIONS carrying, lifting, bending, sitting, standing, squatting, sleeping, stairs, transfers, bed mobility, and dressing  PARTICIPATION LIMITATIONS: meal prep, cleaning, laundry, interpersonal relationship, driving, shopping, community activity, occupation, and yard work  PERSONAL FACTORS Fitness, Past/current  experiences, Time since onset of injury/illness/exacerbation, and 1 comorbidity: PTSD, depression, anxiety  are also affecting patient's functional outcome.   REHAB POTENTIAL: Fair due to multiple injuries and deformities post surgery  CLINICAL DECISION MAKING: Evolving/moderate complexity  EVALUATION COMPLEXITY: Moderate   GOALS: Goals reviewed with patient? Yes  SHORT TERM GOALS: Target date: 01/11/2022  Pain report to be no greater than 5/10. Baseline: Goal status: INITIAL  2.  Patient will be independent with initial HEP  Baseline:  Goal status: IN PROGRESS  LONG TERM GOALS: Target date: 02/08/2022  Patient to be independent with advanced HEP  Baseline:  Goal status: INITIAL  2.  Patient to report pain no increase in pain while performing desired community tasks. Baseline:  Goal status: INITIAL  3.  FOTO to improve to 54% to demonstrate improvements in functional tasks. Baseline: 46% Goal status: INITIAL  4.  Lumbar ROM to improve to [facility] on all planes of motion Baseline:  Goal status: INITIAL  5.  Patient to be able to start a walking program and increase by 1/4 mile per session.   Baseline:  Goal status: INITIAL     PLAN: PT FREQUENCY: 2x/week  PT DURATION: 8 weeks  PLANNED INTERVENTIONS: Therapeutic exercises, Therapeutic activity, Neuromuscular re-education, Balance training, Gait training, Patient/Family education, Self Care, Joint mobilization, Joint manipulation, Aquatic Therapy, Dry Needling, Electrical stimulation, Spinal manipulation, Spinal mobilization, Cryotherapy, Moist heat, Taping, Traction, Ultrasound, Manual therapy, and Re-evaluation.  PLAN FOR NEXT SESSION:  aquatic PT as indicated, review HEP, manual therapy/dry needling, as indicated; tape as needed  Kerin Perna, PTA 01/17/22 6:03 PM [facility] [address] [place], [place], [address] Phone: [phone]   Fax:   [phone]

## 2022-01-19 ENCOUNTER — Ambulatory Visit: Payer: Medicare Other

## 2022-01-19 DIAGNOSIS — M6281 Muscle weakness (generalized): Secondary | ICD-10-CM

## 2022-01-19 DIAGNOSIS — R262 Difficulty in walking, not elsewhere classified: Secondary | ICD-10-CM

## 2022-01-19 DIAGNOSIS — M5459 Other low back pain: Secondary | ICD-10-CM

## 2022-01-19 DIAGNOSIS — R293 Abnormal posture: Secondary | ICD-10-CM

## 2022-01-19 DIAGNOSIS — R252 Cramp and spasm: Secondary | ICD-10-CM

## 2022-01-19 NOTE — Therapy (Addendum)
OUTPATIENT PHYSICAL THERAPY TREATMENT NOTE    Patient Name: Lacey French MRN: [id number] DOB:Dec 27, 1965, 56 y.o., female Today's Date: 01/19/2022  Progress Note Reporting Period 12/13/21 to 01/19/22  See note below for Objective Data and Assessment of Progress/Goals.       PT End of Session - 01/19/22 1020     Visit Number 11    Date for PT Re-Evaluation 02/07/22    Authorization Type Medicare    Progress Note Due on Visit 69    PT Start Time 1015    PT Stop Time 1100    PT Time Calculation (min) 45 min    Activity Tolerance Patient limited by pain    Behavior During Therapy WFL for tasks assessed/performed                Past Medical History:  Diagnosis Date   Allergy    Anxiety    Arthritis    Asthma    COPD (chronic obstructive pulmonary disease) ([place])    Depression    Hyperlipidemia    PTSD (post-traumatic stress disorder)    Past Surgical History:  Procedure Laterality Date   BREAST LUMPECTOMY Left    2, both benign   BREAST SURGERY     REDUCTION   COLONOSCOPY N/A 03/15/2021   Procedure: COLONOSCOPY;  Surgeon: Otis Brace, MD;  Location: [facility];  Service: Gastroenterology;  Laterality: N/A;  pt Allergic to Propofol   JOINT REPLACEMENT     L shoulder, L knee   POLYPECTOMY  03/15/2021   Procedure: POLYPECTOMY;  Surgeon: Otis Brace, MD;  Location: [facility];  Service: Gastroenterology;;   REDUCTION MAMMAPLASTY Bilateral    TONSILLECTOMY     Patient Active Problem List   Diagnosis Date Noted   Acute pain of left knee 11/03/2020   Acute left ankle pain 11/03/2020   Left hand pain 11/03/2020   Carpal tunnel syndrome of left wrist 05/16/2017   PTSD (post-traumatic stress disorder) 04/10/2016   Psychosis ([place]) 04/10/2016   Anxiety 04/10/2016   Allergy 04/10/2016   Asthma 04/10/2016   Depression 04/10/2016    PCP: Kathyrn Lass, MD  REFERRING PROVIDER: Inez Catalina, MD   REFERRING DIAG: M54.50 (ICD-10-CM) - Low back  pain, unspecified   Rationale for Evaluation and Treatment Rehabilitation  THERAPY DIAG:  Other low back pain  Muscle weakness (generalized)  Difficulty in walking, not elsewhere classified  Cramp and spasm  Abnormal posture  ONSET DATE: 12/07/2021  SUBJECTIVE:  SUBJECTIVE STATEMENT: Pt arrives heavily guarded and with elevated pain.  "I did better in the pool this time"  PERTINENT HISTORY:  Injured in [place] of [facility] on 9/11  PAIN:  Are you having pain? Yes: NPRS scale: 5/10 Pain location: low back Pain description: aching Aggravating factors: hurts all the time Relieving factors: nothing   PRECAUTIONS: None  WEIGHT BEARING RESTRICTIONS No  FALLS:  Has patient fallen in last 6 months? No  LIVING ENVIRONMENT: Lives with: lives with their family Lives in: House/apartment   OCCUPATION: disabled  PLOF: Independent, Independent with basic ADLs, Independent with household mobility without device, Independent with community mobility without device, Independent with homemaking with ambulation, Independent with gait, and Independent with transfers  PATIENT GOALS She hopes to gain relief for her back and knees.    OBJECTIVE:   DIAGNOSTIC FINDINGS:  None listed  PATIENT SURVEYS:  Eval:  FOTO  46% (projected 54% by visit 12) 01/19/22: 53%  SCREENING FOR RED FLAGS: Bowel or bladder incontinence: No Spinal tumors: No Cauda equina syndrome: No Compression fracture: No Abdominal aneurysm: No  COGNITION:  Overall cognitive status: Within functional limits for tasks assessed and emotional distress due to 9/11 events      SENSATION: [place]  MUSCLE LENGTH: Hamstrings: Right 60 deg; Left 50 deg Thomas test: Right pos ; Left pos   01/19/22: patient too heavily  guarded  POSTURE: rounded shoulders and decreased lumbar lordosis  PALPATION: Crepitus bilateral knees  LUMBAR ROM:   Active  A/PROM  eval A/PROM 01/19/22  Flexion Fingertips to mid shin Fingertips to mid thigh  Extension WFL Pain with minimal ext  Right lateral flexion Fingertips to just above joint line Fingertips to mid thigh  Left lateral flexion Fingertips to just above joint line Fingertips to just above mid thigh  Right rotation 50% 25%  Left rotation 50% 25%   (Blank rows = not tested)  LOWER EXTREMITY ROM:     WFL with exception of bilateral knee flexion limited to 100 to 120 degrees of flexion comfortably  LOWER EXTREMITY MMT:    Generally 4 to 4-/5 t/o bilateral LE's 01/19/22: Patient too guarded/ inconclusive  LUMBAR SPECIAL TESTS:  Straight leg raise test: Negative  FUNCTIONAL TESTS:  Eval: 5 times sit to stand: time contraints - not completed Timed up and go (TUG): not completed  12/16/2021:  unable to complete secondary to increased pain.  01/19/22: 5 times sit to stand: time contraints - 54.74 sec Timed up and go (TUG): 35.76 sec  GAIT: Distance walked: 50 Assistive device utilized: None Level of assistance: Complete Independence Comments: antalgic    TODAY'S TREATMENT: 01/19/22: Nustep x 5 min level 5 Supine PPT with 90/90 heel taps x 20 (supported) 10th visit re-assessment Trigger Point Dry-Needling  Treatment instructions: Expect mild to moderate muscle soreness. S/S of pneumothorax if dry needled over a lung field, and to seek immediate medical attention should they occur. Patient verbalized understanding of these instructions and education.  Patient Consent Given: Yes Education handout provided: Yes Muscles treated: lumbar multifidus and iliocostalis lumborum Electrical stimulation performed: No Parameters: N/A Treatment response/outcome: patient still somewhat guarded, min twitch response.    01/17/22: Pt seen for aquatic  therapy today.  Treatment took place in water 3.25-4.5 ft in depth at the [place]. Temp of water was 92.  Pt entered/exited the pool independently via steps with hand rail.  In 4+ ft of water holding wall: - with yellow noodle under arms:  small  knee tucks; small pendulums (R/L - increase pain with hips to R) - holding yellow noodle: forward / backward gait (small steps); side stepping (increased pain with R side stepping) ; forward walking kicks (LAQ) - suspended with yellow noodle under arms for decompression - TrA set with short blue noodle pull down -  high knee marching   - plank with hands on bench in water; with hip ext   - return to plank position with noodle under arms    Pt requires the buoyancy and hydrostatic pressure of water for support, and to offload joints by unweighting joint load by at least 50 % in navel deep water and by at least 75-80% in chest to neck deep water.  Viscosity of the water is needed for resistance of strengthening. Water current perturbations provides challenge to standing balance requiring increased core activation.  01/13/2022: Nustep level  (seat at 8, blue machine) x 6 min with PT present to discuss status Prone lying x 2 min Prone on elbows x 2 min Prone press-up x10 Addaday to lumbar paraspinals, low back, gluts, IT bands, and hamstrings bilaterally x 8 min Quadruped cat/cow x10 PPT x 20 PPT with 90/90 heel tap (patient had to do toe tap) x 20 PPT with dying bug Trunk rotation x 20 Prone e-stim:  IFC to standard machine settings, adjusting intensity (up to 24) to tolerance with moist heat pack during e-stim.  X15 min (offered: patient declined saying she can do this at home) Seated piriformis stretch with towel assist 2x20 sec  01/10/22: Pt seen for aquatic therapy today.  Treatment took place in water 3.25-4.5 ft in depth at the [place]. Temp of water was 92.  Pt entered/exited the pool independently via  steps with hand rail.  In 4+ ft of water holding wall: - hip flexor stretch - hip abdct x 10 - marching  Holding yellow noodle - walking forward/ backward ; side stepping  Seated on bench with feet on blue step:  LAQ and seated hamstring stretch (limited range on LLE)   -supported back float with float belt and blue noodle under knees: bad ragaz with lateral stretches while passively moving pt through water   - after pt dried off:  applied reg Rock tape to L midback to lumbar paraspinals to increase proprioception and decrease sensitivity  Pt requires the buoyancy and hydrostatic pressure of water for support, and to offload joints by unweighting joint load by at least 50 % in navel deep water and by at least 75-80% in chest to neck deep water.  Viscosity of the water is needed for resistance of strengthening. Water current perturbations provides challenge to standing balance requiring increased core activation.    PATIENT EDUCATION:  Education details: Aquatics progressions/modifications  Person educated: Patient Education method: Explanation, Demonstration, Corporate treasurer cues, Verbal cues, and Handouts Education comprehension: verbalized understanding, returned demonstration, verbal cues required, and needs further education   HOME EXERCISE PROGRAM: Access Code: [email or web address] URL: [email or web address] Date: 01/19/2022 Prepared by: Candyce Churn  Exercises - Standing Hamstring Stretch on Chair  - 1 x daily - 7 x weekly - 1 sets - 3 reps - 30 hold - Supine Hamstring Stretch with Strap  - 1 x daily - 7 x weekly - 1 sets - 3 reps - 30 hold - Supine ITB Stretch with Strap  - 1 x daily - 7 x weekly - 1 sets - 3 reps - 30  hold - Supine Figure 4 Piriformis Stretch  -  1 x daily - 7 x weekly - 1 sets - 3 reps - 30 hold - Supine Posterior Pelvic Tilt  - 1 x daily - 7 x weekly - 3 sets - 10 reps - Supine Piriformis Stretch with Towel  - 1 x daily - 7 x weekly - 1 sets - 2 reps - 20  sec hold - Standing 'L' Stretch at Counter  - 1 x daily - 7 x weekly - 1 sets - 2 reps - 20 sec hold - Supine 90/90 Alternating Heel Touches with Posterior Pelvic Tilt  - 1 x daily - 7 x weekly - 1 sets - 20 repsAccess Code: [email or web address] URL: [email or web address] Date: 12/29/2021 Prepared by: Shelby Dubin Menke   ASSESSMENT:  CLINICAL IMPRESSION: Pt progressing very slowly.  Still very guarded.  Did not respond significantly to dry needling today but will assess upon next visit if she had improvements post session.  Her pain is still quite elevated and she has poor tolerance to exercise.      She would benefit from continuing skilled PT for LE flexibility, stretching for lumbar spine, core strengthening and spinal mobility.      OBJECTIVE IMPAIRMENTS Abnormal gait, decreased strength, increased muscle spasms, impaired flexibility, postural dysfunction, and pain.   ACTIVITY LIMITATIONS carrying, lifting, bending, sitting, standing, squatting, sleeping, stairs, transfers, bed mobility, and dressing  PARTICIPATION LIMITATIONS: meal prep, cleaning, laundry, interpersonal relationship, driving, shopping, community activity, occupation, and yard work  PERSONAL FACTORS Fitness, Past/current experiences, Time since onset of injury/illness/exacerbation, and 1 comorbidity: PTSD, depression, anxiety  are also affecting patient's functional outcome.   REHAB POTENTIAL: Fair due to multiple injuries and deformities post surgery  CLINICAL DECISION MAKING: Evolving/moderate complexity  EVALUATION COMPLEXITY: Moderate   GOALS: Goals reviewed with patient? Yes  SHORT TERM GOALS: Target date: 01/11/2022  Pain report to be no greater than 5/10. Baseline: Goal status: IN PROGRESS  2.  Patient will be independent with initial HEP  Baseline:  Goal status: IN PROGRESS  LONG TERM GOALS: Target date: 02/08/2022  Patient to be independent with advanced HEP  Baseline:  Goal status:  INITIAL  2.  Patient to report pain no increase in pain while performing desired community tasks. Baseline:  Goal status: INITIAL  3.  FOTO to improve to 54% to demonstrate improvements in functional tasks. Baseline: 46% Goal status: INITIAL  4.  Lumbar ROM to improve to [facility] on all planes of motion Baseline:  Goal status: INITIAL  5.  Patient to be able to start a walking program and increase by 1/4 mile per session.   Baseline:  Goal status: INITIAL     PLAN: PT FREQUENCY: 2x/week  PT DURATION: 8 weeks  PLANNED INTERVENTIONS: Therapeutic exercises, Therapeutic activity, Neuromuscular re-education, Balance training, Gait training, Patient/Family education, Self Care, Joint mobilization, Joint manipulation, Aquatic Therapy, Dry Needling, Electrical stimulation, Spinal manipulation, Spinal mobilization, Cryotherapy, Moist heat, Taping, Traction, Ultrasound, Manual therapy, and Re-evaluation.  PLAN FOR NEXT SESSION:  aquatic PT as indicated, attempt to progress core exercises, manual therapy/dry needling, as indicated; tape as needed  Tytionna Cloyd B. Macari Zalesky, PT 01/19/22 12:03 PM  [place] [address] [place], [place] [address] Phone # [phone] Fax [phone]

## 2022-01-24 ENCOUNTER — Ambulatory Visit: Payer: Medicare Other

## 2022-01-24 DIAGNOSIS — M6281 Muscle weakness (generalized): Secondary | ICD-10-CM

## 2022-01-24 DIAGNOSIS — R262 Difficulty in walking, not elsewhere classified: Secondary | ICD-10-CM

## 2022-01-24 DIAGNOSIS — M5459 Other low back pain: Secondary | ICD-10-CM | POA: Diagnosis not present

## 2022-01-24 DIAGNOSIS — R293 Abnormal posture: Secondary | ICD-10-CM

## 2022-01-24 DIAGNOSIS — R252 Cramp and spasm: Secondary | ICD-10-CM

## 2022-01-24 NOTE — Therapy (Signed)
OUTPATIENT PHYSICAL THERAPY TREATMENT NOTE    Patient Name: Lacey French MRN: [id number] DOB:04/25/1965, 56 y.o., female Today's Date: 01/24/2022   PT End of Session - 01/24/22 1023     Visit Number 12    Date for PT Re-Evaluation 02/07/22    Authorization Type Medicare    PT Start Time 1020    PT Stop Time 1111    PT Time Calculation (min) 51 min    Activity Tolerance Patient limited by pain    Behavior During Therapy [facility] for tasks assessed/performed                Past Medical History:  Diagnosis Date   Allergy    Anxiety    Arthritis    Asthma    COPD (chronic obstructive pulmonary disease) ([place])    Depression    Hyperlipidemia    PTSD (post-traumatic stress disorder)    Past Surgical History:  Procedure Laterality Date   BREAST LUMPECTOMY Left    2, both benign   BREAST SURGERY     REDUCTION   COLONOSCOPY N/A 03/15/2021   Procedure: COLONOSCOPY;  Surgeon: Otis Brace, MD;  [facility]: [facility];  Service: Gastroenterology;  Laterality: N/A;  pt Allergic to Propofol   JOINT REPLACEMENT     L shoulder, L knee   POLYPECTOMY  03/15/2021   Procedure: POLYPECTOMY;  Surgeon: Otis Brace, MD;  [facility]: [facility];  Service: Gastroenterology;;   REDUCTION MAMMAPLASTY Bilateral    TONSILLECTOMY     Patient Active Problem List   Diagnosis Date Noted   Acute pain of left knee 11/03/2020   Acute left ankle pain 11/03/2020   Left hand pain 11/03/2020   Carpal tunnel syndrome of left wrist 05/16/2017   PTSD (post-traumatic stress disorder) 04/10/2016   Psychosis ([place]) 04/10/2016   Anxiety 04/10/2016   Allergy 04/10/2016   Asthma 04/10/2016   Depression 04/10/2016    PCP: Kathyrn Lass, MD  REFERRING PROVIDER: Inez Catalina, MD   REFERRING DIAG: M54.50 (ICD-10-CM) - Low back pain, unspecified   Rationale for Evaluation and Treatment Rehabilitation  THERAPY DIAG:  Other low back pain  Muscle weakness (generalized)  Difficulty in  walking, not elsewhere classified  Cramp and spasm  Abnormal posture  ONSET DATE: 12/07/2021  SUBJECTIVE:                                                                                                                                                                                           SUBJECTIVE STATEMENT: Pt arrives still guarded.  Appears to be moving slightly better than last visit.  She rates her pain at 5/10.  She states the dry needling did not help.    PERTINENT HISTORY:  Injured in [place] of [facility] on 9/11  PAIN:  Are you having pain? Yes: NPRS scale: 5/10 Pain location: low back Pain description: aching Aggravating factors: hurts all the time Relieving factors: nothing   PRECAUTIONS: None  WEIGHT BEARING RESTRICTIONS No  FALLS:  Has patient fallen in last 6 months? No  LIVING ENVIRONMENT: Lives with: lives with their family Lives in: House/apartment   OCCUPATION: disabled  PLOF: Independent, Independent with basic ADLs, Independent with household mobility without device, Independent with community mobility without device, Independent with homemaking with ambulation, Independent with gait, and Independent with transfers  PATIENT GOALS She hopes to gain relief for her back and knees.    OBJECTIVE:   DIAGNOSTIC FINDINGS:  None listed  PATIENT SURVEYS:  Eval:  FOTO  46% (projected 54% by visit 12) 01/19/22: 53%  SCREENING FOR RED FLAGS: Bowel or bladder incontinence: No Spinal tumors: No Cauda equina syndrome: No Compression fracture: No Abdominal aneurysm: No  COGNITION:  Overall cognitive status: Within functional limits for tasks assessed and emotional distress due to 9/11 events      SENSATION: [facility]  MUSCLE LENGTH: Hamstrings: Right 60 deg; Left 50 deg Thomas test: Right pos ; Left pos   01/19/22: patient too heavily guarded  POSTURE: rounded shoulders and decreased lumbar lordosis  PALPATION: Crepitus bilateral knees  LUMBAR  ROM:   Active  A/PROM  eval A/PROM 01/19/22  Flexion Fingertips to mid shin Fingertips to mid thigh  Extension WFL Pain with minimal ext  Right lateral flexion Fingertips to just above joint line Fingertips to mid thigh  Left lateral flexion Fingertips to just above joint line Fingertips to just above mid thigh  Right rotation 50% 25%  Left rotation 50% 25%   (Blank rows = not tested)  LOWER EXTREMITY ROM:     WFL with exception of bilateral knee flexion limited to 100 to 120 degrees of flexion comfortably  LOWER EXTREMITY MMT:    Generally 4 to 4-/5 t/o bilateral LE's 01/19/22: Patient too guarded/ inconclusive  LUMBAR SPECIAL TESTS:  Straight leg raise test: Negative  FUNCTIONAL TESTS:  Eval: 5 times sit to stand: time contraints - not completed Timed up and go (TUG): not completed  12/16/2021:  unable to complete secondary to increased pain.  01/19/22: 5 times sit to stand: time contraints - 54.74 sec Timed up and go (TUG): 35.76 sec  GAIT: Distance walked: 50 Assistive device utilized: None Level of assistance: Complete Independence Comments: antalgic    TODAY'S TREATMENT: 01/24/22: Nustep x 5 min level 5 Hamstring stretch (supine) 3 x 30 sec each LE IT band stretch (supine) 3 x 30 sec each LE Open book stretch x 10 each side PPT x 20 PPT with march x 20 PPT with SLR 2 x 10 each LE Ice pack to lumbar spine x 10 min in hook lying   01/19/22: Nustep x 5 min level 5 Supine PPT with 90/90 heel taps x 20 (supported) 10th visit re-assessment Trigger Point Dry-Needling  Treatment instructions: Expect mild to moderate muscle soreness. S/S of pneumothorax if dry needled over a lung field, and to seek immediate medical attention should they occur. Patient verbalized understanding of these instructions and education.  Patient Consent Given: Yes Education handout provided: Yes Muscles treated: lumbar multifidus and iliocostalis lumborum Electrical stimulation  performed: No Parameters: N/A Treatment response/outcome: patient still somewhat guarded, min twitch response.    01/17/22:  Pt seen for aquatic therapy today.  Treatment took place in water 3.25-4.5 ft in depth at the [place]. Temp of water was 92.  Pt entered/exited the pool independently via steps with hand rail.  In 4+ ft of water holding wall: - with yellow noodle under arms:  small knee tucks; small pendulums (R/L - increase pain with hips to R) - holding yellow noodle: forward / backward gait (small steps); side stepping (increased pain with R side stepping) ; forward walking kicks (LAQ) - suspended with yellow noodle under arms for decompression - TrA set with short blue noodle pull down -  high knee marching   - plank with hands on bench in water; with hip ext   - return to plank position with noodle under arms    Pt requires the buoyancy and hydrostatic pressure of water for support, and to offload joints by unweighting joint load by at least 50 % in navel deep water and by at least 75-80% in chest to neck deep water.  Viscosity of the water is needed for resistance of strengthening. Water current perturbations provides challenge to standing balance requiring increased core activation.   PATIENT EDUCATION:  Education details: Aquatics progressions/modifications  Person educated: Patient Education method: Explanation, Demonstration, Corporate treasurer cues, Verbal cues, and Handouts Education comprehension: verbalized understanding, returned demonstration, verbal cues required, and needs further education   HOME EXERCISE PROGRAM: Access Code: [email or web address] URL: [email or web address] Date: 01/19/2022 Prepared by: Candyce Churn  Exercises - Standing Hamstring Stretch on Chair  - 1 x daily - 7 x weekly - 1 sets - 3 reps - 30 hold - Supine Hamstring Stretch with Strap  - 1 x daily - 7 x weekly - 1 sets - 3 reps - 30 hold - Supine ITB Stretch with Strap  - 1  x daily - 7 x weekly - 1 sets - 3 reps - 30  hold - Supine Figure 4 Piriformis Stretch  - 1 x daily - 7 x weekly - 1 sets - 3 reps - 30 hold - Supine Posterior Pelvic Tilt  - 1 x daily - 7 x weekly - 3 sets - 10 reps - Supine Piriformis Stretch with Towel  - 1 x daily - 7 x weekly - 1 sets - 2 reps - 20 sec hold - Standing 'L' Stretch at Counter  - 1 x daily - 7 x weekly - 1 sets - 2 reps - 20 sec hold - Supine 90/90 Alternating Heel Touches with Posterior Pelvic Tilt  - 1 x daily - 7 x weekly - 1 sets - 20 repsAccess Code: [email or web address] URL: [email or web address] Date: 12/29/2021 Prepared by: Shelby Dubin Menke   ASSESSMENT:  CLINICAL IMPRESSION: Pt continues to progress very slowly.  Still very guarded but maybe slight decreased guarding today.  She continues to report "no better".    Did not respond significantly to dry needling last session.  Her pain is still quite elevated and she has poor tolerance to exercise.   She did complete several core exercises and stretching today with better tolerance.  She would benefit from continued skilled PT for core stabilization and flexibility exercises.     She would benefit from continuing skilled PT for LE flexibility, stretching for lumbar spine, core strengthening and spinal mobility.      OBJECTIVE IMPAIRMENTS Abnormal gait, decreased strength, increased muscle spasms, impaired flexibility, postural dysfunction, and pain.   ACTIVITY LIMITATIONS carrying, lifting, bending, sitting, standing, squatting, sleeping, stairs, transfers, bed mobility, and dressing  PARTICIPATION LIMITATIONS: meal prep, cleaning, laundry, interpersonal relationship, driving, shopping, community activity, occupation, and yard work  PERSONAL FACTORS Fitness, Past/current experiences, Time since onset of injury/illness/exacerbation, and 1 comorbidity: PTSD, depression, anxiety  are also affecting patient's functional outcome.   REHAB POTENTIAL: Fair due to multiple  injuries and deformities post surgery  CLINICAL DECISION MAKING: Evolving/moderate complexity  EVALUATION COMPLEXITY: Moderate   GOALS: Goals reviewed with patient? Yes  SHORT TERM GOALS: Target date: 01/11/2022  Pain report to be no greater than 5/10. Baseline: Goal status: IN PROGRESS  2.  Patient will be independent with initial HEP  Baseline:  Goal status: IN PROGRESS  LONG TERM GOALS: Target date: 02/08/2022  Patient to be independent with advanced HEP  Baseline:  Goal status: INITIAL  2.  Patient to report pain no increase in pain while performing desired community tasks. Baseline:  Goal status: INITIAL  3.  FOTO to improve to 54% to demonstrate improvements in functional tasks. Baseline: 46% Goal status: INITIAL  4.  Lumbar ROM to improve to [facility] on all planes of motion Baseline:  Goal status: INITIAL  5.  Patient to be able to start a walking program and increase by 1/4 mile per session.   Baseline:  Goal status: INITIAL     PLAN: PT FREQUENCY: 2x/week  PT DURATION: 8 weeks  PLANNED INTERVENTIONS: Therapeutic exercises, Therapeutic activity, Neuromuscular re-education, Balance training, Gait training, Patient/Family education, Self Care, Joint mobilization, Joint manipulation, Aquatic Therapy, Dry Needling, Electrical stimulation, Spinal manipulation, Spinal mobilization, Cryotherapy, Moist heat, Taping, Traction, Ultrasound, Manual therapy, and Re-evaluation.  PLAN FOR NEXT SESSION:  aquatic PT as indicated, attempt to progress core exercises, manual therapy/dry needling, as indicated; tape as needed  Alyne Martinson B. Khristen Cheyney, PT 01/24/22 11:08 AM  [place] [address], [place], [place] [address] Phone # [phone] Fax [phone]

## 2022-01-31 ENCOUNTER — Ambulatory Visit (HOSPITAL_BASED_OUTPATIENT_CLINIC_OR_DEPARTMENT_OTHER): Payer: Medicare Other | Admitting: Physical Therapy

## 2022-01-31 ENCOUNTER — Encounter (HOSPITAL_BASED_OUTPATIENT_CLINIC_OR_DEPARTMENT_OTHER): Payer: Self-pay | Admitting: Physical Therapy

## 2022-01-31 DIAGNOSIS — M6281 Muscle weakness (generalized): Secondary | ICD-10-CM

## 2022-01-31 DIAGNOSIS — R262 Difficulty in walking, not elsewhere classified: Secondary | ICD-10-CM

## 2022-01-31 DIAGNOSIS — R252 Cramp and spasm: Secondary | ICD-10-CM

## 2022-01-31 DIAGNOSIS — M5459 Other low back pain: Secondary | ICD-10-CM

## 2022-01-31 DIAGNOSIS — R293 Abnormal posture: Secondary | ICD-10-CM

## 2022-01-31 NOTE — Therapy (Signed)
OUTPATIENT PHYSICAL THERAPY TREATMENT NOTE    Patient Name: Lacey French MRN: [id number] DOB:05-Oct-1965, 56 y.o., female Today's Date: 01/31/2022   PT End of Session - 01/31/22 1719     Visit Number 13    Date for PT Re-Evaluation 02/07/22    Authorization Type Medicare    Progress Note Due on Visit 7    PT Start Time 1532    PT Stop Time 1612    PT Time Calculation (min) 40 min    Activity Tolerance Patient limited by pain    Behavior During Therapy [facility] for tasks assessed/performed                 Past Medical History:  Diagnosis Date   Allergy    Anxiety    Arthritis    Asthma    COPD (chronic obstructive pulmonary disease) ([place])    Depression    Hyperlipidemia    PTSD (post-traumatic stress disorder)    Past Surgical History:  Procedure Laterality Date   BREAST LUMPECTOMY Left    2, both benign   BREAST SURGERY     REDUCTION   COLONOSCOPY N/A 03/15/2021   Procedure: COLONOSCOPY;  Surgeon: Otis Brace, MD;  Location: [facility];  Service: Gastroenterology;  Laterality: N/A;  pt Allergic to Propofol   JOINT REPLACEMENT     L shoulder, L knee   POLYPECTOMY  03/15/2021   Procedure: POLYPECTOMY;  Surgeon: Otis Brace, MD;  Location: [facility];  Service: Gastroenterology;;   REDUCTION MAMMAPLASTY Bilateral    TONSILLECTOMY     Patient Active Problem List   Diagnosis Date Noted   Acute pain of left knee 11/03/2020   Acute left ankle pain 11/03/2020   Left hand pain 11/03/2020   Carpal tunnel syndrome of left wrist 05/16/2017   PTSD (post-traumatic stress disorder) 04/10/2016   Psychosis ([place]) 04/10/2016   Anxiety 04/10/2016   Allergy 04/10/2016   Asthma 04/10/2016   Depression 04/10/2016    PCP: Kathyrn Lass, MD  REFERRING PROVIDER: Inez Catalina, MD   REFERRING DIAG: M54.50 (ICD-10-CM) - Low back pain, unspecified   Rationale for Evaluation and Treatment Rehabilitation  THERAPY DIAG:  Other low back pain  Muscle  weakness (generalized)  Difficulty in walking, not elsewhere classified  Cramp and spasm  Abnormal posture  ONSET DATE: 12/07/2021  SUBJECTIVE:                                                                                                                                                                                           SUBJECTIVE STATEMENT: Pt reports she has not tried walking sticks.   "I'd  really like this (aquatic therapy) to work".   "The fact that I can move my R leg better than I could last week is good".    PERTINENT HISTORY:  Injured in [place] of [facility] on 9/11  PAIN:  Are you having pain? Yes: NPRS scale: 4/10 Pain location: low back Pain description: aching Aggravating factors: hurts all the time Relieving factors: nothing   PRECAUTIONS: None  WEIGHT BEARING RESTRICTIONS No  FALLS:  Has patient fallen in last 6 months? No  LIVING ENVIRONMENT: Lives with: lives with their family Lives in: House/apartment   OCCUPATION: disabled  PLOF: Independent, Independent with basic ADLs, Independent with household mobility without device, Independent with community mobility without device, Independent with homemaking with ambulation, Independent with gait, and Independent with transfers  PATIENT GOALS She hopes to gain relief for her back and knees.    OBJECTIVE:   DIAGNOSTIC FINDINGS:  None listed  PATIENT SURVEYS:  Eval:  FOTO  46% (projected 54% by visit 12) 01/19/22: 53%  SCREENING FOR RED FLAGS: Bowel or bladder incontinence: No Spinal tumors: No Cauda equina syndrome: No Compression fracture: No Abdominal aneurysm: No  COGNITION:  Overall cognitive status: Within functional limits for tasks assessed and emotional distress due to 9/11 events      SENSATION: [facility]  MUSCLE LENGTH: Hamstrings: Right 60 deg; Left 50 deg Thomas test: Right pos ; Left pos   01/19/22: patient too heavily guarded  POSTURE: rounded shoulders and decreased  lumbar lordosis  PALPATION: Crepitus bilateral knees  LUMBAR ROM:   Active  A/PROM  eval A/PROM 01/19/22  Flexion Fingertips to mid shin Fingertips to mid thigh  Extension WFL Pain with minimal ext  Right lateral flexion Fingertips to just above joint line Fingertips to mid thigh  Left lateral flexion Fingertips to just above joint line Fingertips to just above mid thigh  Right rotation 50% 25%  Left rotation 50% 25%   (Blank rows = not tested)  LOWER EXTREMITY ROM:     WFL with exception of bilateral knee flexion limited to 100 to 120 degrees of flexion comfortably  LOWER EXTREMITY MMT:    Generally 4 to 4-/5 t/o bilateral LE's 01/19/22: Patient too guarded/ inconclusive  LUMBAR SPECIAL TESTS:  Straight leg raise test: Negative  FUNCTIONAL TESTS:  Eval: 5 times sit to stand: time contraints - not completed Timed up and go (TUG): not completed  12/16/2021:  unable to complete secondary to increased pain.  01/19/22: 5 times sit to stand: time contraints - 54.74 sec Timed up and go (TUG): 35.76 sec  GAIT: Distance walked: 50 Assistive device utilized: None Level of assistance: Complete Independence Comments: antalgic    TODAY'S TREATMENT: 01/31/22: Pt seen for aquatic therapy today.  Treatment took place in water 3.25-4.5 ft [place] [facility] [place] Temp of water was 92.  Pt entered/exited the pool independently via steps with hand rail.  In 4 ft 8" water: - with thick square noodle under arms:  backward/ forward walking (slow, and to tolerance), SLS with small circumduction of LE, heel raises x 4 ( not tolerated in LLE); side stepping with very small steps  - sitting on thick square noodle, LE suspended:  breast stroke arms, bilat shoulder addct/abdct; bilat shoulder horz abdct; doggie paddle (not tolerated) -return to walking with hands supported on thick square noodle, forward / backwards - plank with hands on bench in water; repeated with  gentle alternating hip and knee flexion ( 3 reps, stopped  due to increase pain with WB in LLE while moving RLE forward   - return to plank position with noodle under arms - suspended with blue thick noodle under arms for decompression - TrA set with short blue noodle pull down x 10  When pt was dried off: I strips of reg Rock tape applied to bilat paraspinals from bra strap ~T10 to superior glute max with 20% stretch to increase proprioception and assist with desensitization.       Pt requires the buoyancy and hydrostatic pressure of water for support, and to offload joints by unweighting joint load by at least 50 % in navel deep water and by at least 75-80% in chest to neck deep water.  Viscosity of the water is needed for resistance of strengthening. Water current perturbations provides challenge to standing balance requiring increased core activation.  01/24/22: Nustep x 5 min level 5 Hamstring stretch (supine) 3 x 30 sec each LE IT band stretch (supine) 3 x 30 sec each LE Open book stretch x 10 each side PPT x 20 PPT with march x 20 PPT with SLR 2 x 10 each LE Ice pack to lumbar spine x 10 min in hook lying   01/19/22: Nustep x 5 min level 5 Supine PPT with 90/90 heel taps x 20 (supported) 10th visit re-assessment Trigger Point Dry-Needling  Treatment instructions: Expect mild to moderate muscle soreness. S/S of pneumothorax if dry needled over a lung field, and to seek immediate medical attention should they occur. Patient verbalized understanding of these instructions and education.  Patient Consent Given: Yes Education handout provided: Yes Muscles treated: lumbar multifidus and iliocostalis lumborum Electrical stimulation performed: No Parameters: N/A Treatment response/outcome: patient still somewhat guarded, min twitch response.    01/17/22: Pt seen for aquatic therapy today.  Treatment took place in water 3.25-4.5 ft in depth at the [place]. Temp of  water was 92.  Pt entered/exited the pool independently via steps with hand rail.  In 4+ ft of water holding wall: - with yellow noodle under arms:  small knee tucks; small pendulums (R/L - increase pain with hips to R) - holding yellow noodle: forward / backward gait (small steps); side stepping (increased pain with R side stepping) ; forward walking kicks (LAQ) - suspended with yellow noodle under arms for decompression - TrA set with short blue noodle pull down -  high knee marching   - plank with hands on bench in water; with hip ext   - return to plank position with noodle under arms    Pt requires the buoyancy and hydrostatic pressure of water for support, and to offload joints by unweighting joint load by at least 50 % in navel deep water and by at least 75-80% in chest to neck deep water.  Viscosity of the water is needed for resistance of strengthening. Water current perturbations provides challenge to standing balance requiring increased core activation.   PATIENT EDUCATION:  Education details: Aquatics progressions/modifications  Person educated: Patient Education method: Explanation, Demonstration, Corporate treasurer cues, Verbal cues, and Handouts Education comprehension: verbalized understanding, returned demonstration, verbal cues required, and needs further education   HOME EXERCISE PROGRAM: Access Code: [email or web address] URL: [email or web address] Date: 01/19/2022 Prepared by: Candyce Churn  Exercises - Standing Hamstring Stretch on Chair  - 1 x daily - 7 x weekly - 1 sets - 3 reps - 30 hold - Supine Hamstring Stretch with Strap  - 1 x daily - 7 x weekly - 1 sets -  3 reps - 30 hold - Supine ITB Stretch with Strap  - 1 x daily - 7 x weekly - 1 sets - 3 reps - 30  hold - Supine Figure 4 Piriformis Stretch  - 1 x daily - 7 x weekly - 1 sets - 3 reps - 30 hold - Supine Posterior Pelvic Tilt  - 1 x daily - 7 x weekly - 3 sets - 10 reps - Supine Piriformis Stretch with Towel   - 1 x daily - 7 x weekly - 1 sets - 2 reps - 20 sec hold - Standing 'L' Stretch at Counter  - 1 x daily - 7 x weekly - 1 sets - 2 reps - 20 sec hold - Supine 90/90 Alternating Heel Touches with Posterior Pelvic Tilt  - 1 x daily - 7 x weekly - 1 sets - 20 repsAccess Code: [email or web address] URL: [email or web address] Date: 12/29/2021 Prepared by: Shelby Dubin Menke   ASSESSMENT:  CLINICAL IMPRESSION: Pt continues to progress very slowly.  She is observed walking into pool area with increased gait speed and more erect posture. Continued limited tolerance for WB into LLE despite being 70-80% submerged in deeper water. When Lt hip moves into flexion, pt reports sharp "ripping sensation" of pain in back from Lt bra strap to gluteal fold; pain gradually eases with time and LEs suspended.  She may benefit from continued skilled PT for core stabilization and flexibility exercises.  PT to assess goals next week; POC ending 12/5.      OBJECTIVE IMPAIRMENTS Abnormal gait, decreased strength, increased muscle spasms, impaired flexibility, postural dysfunction, and pain.   ACTIVITY LIMITATIONS carrying, lifting, bending, sitting, standing, squatting, sleeping, stairs, transfers, bed mobility, and dressing  PARTICIPATION LIMITATIONS: meal prep, cleaning, laundry, interpersonal relationship, driving, shopping, community activity, occupation, and yard work  PERSONAL FACTORS Fitness, Past/current experiences, Time since onset of injury/illness/exacerbation, and 1 comorbidity: PTSD, depression, anxiety  are also affecting patient's functional outcome.   REHAB POTENTIAL: Fair due to multiple injuries and deformities post surgery  CLINICAL DECISION MAKING: Evolving/moderate complexity  EVALUATION COMPLEXITY: Moderate   GOALS: Goals reviewed with patient? Yes  SHORT TERM GOALS: Target date: 01/11/2022  Pain report to be no greater than 5/10. Baseline: Goal status: IN PROGRESS  2.  Patient will be  independent with initial HEP  Baseline:  Goal status: IN PROGRESS  LONG TERM GOALS: Target date: 02/08/2022  Patient to be independent with advanced HEP  Baseline:  Goal status: INITIAL  2.  Patient to report pain no increase in pain while performing desired community tasks. Baseline:  Goal status: INITIAL  3.  FOTO to improve to 54% to demonstrate improvements in functional tasks. Baseline: 46% Goal status: INITIAL  4.  Lumbar ROM to improve to [facility] on all planes of motion Baseline:  Goal status: INITIAL  5.  Patient to be able to start a walking program and increase by 1/4 mile per session.   Baseline:  Goal status: INITIAL     PLAN: PT FREQUENCY: 2x/week  PT DURATION: 8 weeks  PLANNED INTERVENTIONS: Therapeutic exercises, Therapeutic activity, Neuromuscular re-education, Balance training, Gait training, Patient/Family education, Self Care, Joint mobilization, Joint manipulation, Aquatic Therapy, Dry Needling, Electrical stimulation, Spinal manipulation, Spinal mobilization, Cryotherapy, Moist heat, Taping, Traction, Ultrasound, Manual therapy, and Re-evaluation.  PLAN FOR NEXT SESSION:  aquatic PT as indicated, attempt to progress core exercises, manual therapy/dry needling, as indicated; tape as needed

## 2022-02-02 ENCOUNTER — Ambulatory Visit: Payer: Medicare Other

## 2022-02-02 DIAGNOSIS — R293 Abnormal posture: Secondary | ICD-10-CM

## 2022-02-02 DIAGNOSIS — M5459 Other low back pain: Secondary | ICD-10-CM

## 2022-02-02 DIAGNOSIS — M6281 Muscle weakness (generalized): Secondary | ICD-10-CM

## 2022-02-02 DIAGNOSIS — R262 Difficulty in walking, not elsewhere classified: Secondary | ICD-10-CM

## 2022-02-02 DIAGNOSIS — R252 Cramp and spasm: Secondary | ICD-10-CM

## 2022-02-02 NOTE — Therapy (Signed)
OUTPATIENT PHYSICAL THERAPY TREATMENT NOTE    Patient Name: Lacey French MRN: [id number] DOB:November 19, 1965, 56 y.o., female Today's Date: 02/02/2022   PT End of Session - 02/02/22 1110     Visit Number 14    Date for PT Re-Evaluation 02/07/22    Authorization Type Medicare    Progress Note Due on Visit 82    PT Start Time 1103    PT Stop Time 1152    PT Time Calculation (min) 49 min    Activity Tolerance Patient limited by pain    Behavior During Therapy WFL for tasks assessed/performed                 Past Medical History:  Diagnosis Date   Allergy    Anxiety    Arthritis    Asthma    COPD (chronic obstructive pulmonary disease) ([place])    Depression    Hyperlipidemia    PTSD (post-traumatic stress disorder)    Past Surgical History:  Procedure Laterality Date   BREAST LUMPECTOMY Left    2, both benign   BREAST SURGERY     REDUCTION   COLONOSCOPY N/A 03/15/2021   Procedure: COLONOSCOPY;  Surgeon: Otis Brace, MD;  Location: [facility];  Service: Gastroenterology;  Laterality: N/A;  pt Allergic to Propofol   JOINT REPLACEMENT     L shoulder, L knee   POLYPECTOMY  03/15/2021   Procedure: POLYPECTOMY;  Surgeon: Otis Brace, MD;  Location: [facility];  Service: Gastroenterology;;   REDUCTION MAMMAPLASTY Bilateral    TONSILLECTOMY     Patient Active Problem List   Diagnosis Date Noted   Acute pain of left knee 11/03/2020   Acute left ankle pain 11/03/2020   Left hand pain 11/03/2020   Carpal tunnel syndrome of left wrist 05/16/2017   PTSD (post-traumatic stress disorder) 04/10/2016   Psychosis ([place]) 04/10/2016   Anxiety 04/10/2016   Allergy 04/10/2016   Asthma 04/10/2016   Depression 04/10/2016    PCP: Kathyrn Lass, MD  REFERRING PROVIDER: Inez Catalina, MD   REFERRING DIAG: M54.50 (ICD-10-CM) - Low back pain, unspecified   Rationale for Evaluation and Treatment Rehabilitation  THERAPY DIAG:  Other low back pain  Muscle  weakness (generalized)  Difficulty in walking, not elsewhere classified  Cramp and spasm  Abnormal posture  ONSET DATE: 12/07/2021  SUBJECTIVE:                                                                                                                                                                                           SUBJECTIVE STATEMENT: Pt reports she has not tried walking sticks.   "I'd  really like this (aquatic therapy) to work".   "The fact that I can move my R leg better than I could last week is good".    PERTINENT HISTORY:  Injured in [place] of [facility] on 9/11  PAIN:  Are you having pain? Yes: NPRS scale: 4/10 Pain location: low back Pain description: aching Aggravating factors: hurts all the time Relieving factors: nothing   PRECAUTIONS: None  WEIGHT BEARING RESTRICTIONS No  FALLS:  Has patient fallen in last 6 months? No  LIVING ENVIRONMENT: Lives with: lives with their family Lives in: House/apartment   OCCUPATION: disabled  PLOF: Independent, Independent with basic ADLs, Independent with household mobility without device, Independent with community mobility without device, Independent with homemaking with ambulation, Independent with gait, and Independent with transfers  PATIENT GOALS She hopes to gain relief for her back and knees.    OBJECTIVE:   DIAGNOSTIC FINDINGS:  None listed  PATIENT SURVEYS:  Eval:  FOTO  46% (projected 54% by visit 12) 01/19/22: 53%  SCREENING FOR RED FLAGS: Bowel or bladder incontinence: No Spinal tumors: No Cauda equina syndrome: No Compression fracture: No Abdominal aneurysm: No  COGNITION:  Overall cognitive status: Within functional limits for tasks assessed and emotional distress due to 9/11 events      SENSATION: [facility]  MUSCLE LENGTH: Hamstrings: Right 60 deg; Left 50 deg Thomas test: Right pos ; Left pos   01/19/22: patient too heavily guarded  POSTURE: rounded shoulders and decreased  lumbar lordosis  PALPATION: Crepitus bilateral knees  LUMBAR ROM:   Active  A/PROM  eval A/PROM 01/19/22  Flexion Fingertips to mid shin Fingertips to mid thigh  Extension WFL Pain with minimal ext  Right lateral flexion Fingertips to just above joint line Fingertips to mid thigh  Left lateral flexion Fingertips to just above joint line Fingertips to just above mid thigh  Right rotation 50% 25%  Left rotation 50% 25%   (Blank rows = not tested)  LOWER EXTREMITY ROM:     WFL with exception of bilateral knee flexion limited to 100 to 120 degrees of flexion comfortably  LOWER EXTREMITY MMT:    Generally 4 to 4-/5 t/o bilateral LE's 01/19/22: Patient too guarded/ inconclusive  LUMBAR SPECIAL TESTS:  Straight leg raise test: Negative  FUNCTIONAL TESTS:  Eval: 5 times sit to stand: time contraints - not completed Timed up and go (TUG): not completed  12/16/2021:  unable to complete secondary to increased pain.  01/19/22: 5 times sit to stand: time contraints - 54.74 sec Timed up and go (TUG): 35.76 sec  GAIT: Distance walked: 50 Assistive device utilized: None Level of assistance: Complete Independence Comments: antalgic    TODAY'S TREATMENT: 02/02/22: PPT x 20 Seated piriformis stretch 3 x 10 sec (using strap, patient unable to get ankle on top of knee) Hamstring stretch (supine) 3 x 30 sec each LE (verbal cues to find more of an isolated hamstring stretch by flexing the knee) IT band stretch (supine) 3 x 30 sec each LE Trunk rotation x 10 each side Open book stretch x 10 each side PPT with march x 20 Ice pack to lumbar spine x 10 min in hook lying  01/31/22: Pt seen for aquatic therapy today.  Treatment took place in water 3.25-4.5 ft in depth at the [facility]. Temp of water was 92.  Pt entered/exited the pool independently via steps with hand rail.  In 4 ft 8" water: - with thick square noodle under arms:  backward/  forward walking (slow,  and to tolerance), SLS with small circumduction of LE, heel raises x 4 ( not tolerated in LLE); side stepping with very small steps  - sitting on thick square noodle, LE suspended:  breast stroke arms, bilat shoulder addct/abdct; bilat shoulder horz abdct; doggie paddle (not tolerated) -return to walking with hands supported on thick square noodle, forward / backwards - plank with hands on bench in water; repeated with gentle alternating hip and knee flexion ( 3 reps, stopped due to increase pain with WB in LLE while moving RLE forward   - return to plank position with noodle under arms - suspended with blue thick noodle under arms for decompression - TrA set with short blue noodle pull down x 10  When pt was dried off: I strips of reg Rock tape applied to bilat paraspinals from bra strap ~T10 to superior glute max with 20% stretch to increase proprioception and assist with desensitization.       Pt requires the buoyancy and hydrostatic pressure of water for support, and to offload joints by unweighting joint load by at least 50 % in navel deep water and by at least 75-80% in chest to neck deep water.  Viscosity of the water is needed for resistance of strengthening. Water current perturbations provides challenge to standing balance requiring increased core activation.  01/24/22: Nustep x 5 min level 5 Hamstring stretch (supine) 3 x 30 sec each LE IT band stretch (supine) 3 x 30 sec each LE Open book stretch x 10 each side PPT x 20 PPT with march x 20 PPT with SLR 2 x 10 each LE Ice pack to lumbar spine x 10 min in hook lying    PATIENT EDUCATION:  Education details: Aquatics progressions/modifications  Person educated: Patient Education method: Consulting civil engineer, Demonstration, Corporate treasurer cues, Verbal cues, and Handouts Education comprehension: verbalized understanding, returned demonstration, verbal cues required, and needs further education   HOME EXERCISE PROGRAM: Access Code:  [email or web address] URL: [email or web address] Date: 01/19/2022 Prepared by: Candyce Churn  Exercises - Standing Hamstring Stretch on Chair  - 1 x daily - 7 x weekly - 1 sets - 3 reps - 30 hold - Supine Hamstring Stretch with Strap  - 1 x daily - 7 x weekly - 1 sets - 3 reps - 30 hold - Supine ITB Stretch with Strap  - 1 x daily - 7 x weekly - 1 sets - 3 reps - 30  hold - Supine Figure 4 Piriformis Stretch  - 1 x daily - 7 x weekly - 1 sets - 3 reps - 30 hold - Supine Posterior Pelvic Tilt  - 1 x daily - 7 x weekly - 3 sets - 10 reps - Supine Piriformis Stretch with Towel  - 1 x daily - 7 x weekly - 1 sets - 2 reps - 20 sec hold - Standing 'L' Stretch at Counter  - 1 x daily - 7 x weekly - 1 sets - 2 reps - 20 sec hold - Supine 90/90 Alternating Heel Touches with Posterior Pelvic Tilt  - 1 x daily - 7 x weekly - 1 sets - 20 repsAccess Code: [email or web address] URL: [email or web address] Date: 12/29/2021 Prepared by: Shelby Dubin Menke   ASSESSMENT:  CLINICAL IMPRESSION: Suzi Roots has very low tolerance to any activity we do on land but she attempts to complete all.  With each stretch she does not feel the stretch at the intended target indicating extreme guarding and limited tissue mobility.  She does not respond  to dry needling to assist with muscle elongation.  She does admit that she moves with much increased ease in the pool but has also sharp burning pains with lifting her legs at times in the pool.  She completed all tasks today with pain.  We are limited on what we are able to accomplish each session due to severe guarding and transitions are slow.  She has re-assessment visit next week.  We will determine justification to continue or DC at that time due to min progress.     OBJECTIVE IMPAIRMENTS Abnormal gait, decreased strength, increased muscle spasms, impaired flexibility, postural dysfunction, and pain.   ACTIVITY LIMITATIONS carrying, lifting, bending, sitting, standing,  squatting, sleeping, stairs, transfers, bed mobility, and dressing  PARTICIPATION LIMITATIONS: meal prep, cleaning, laundry, interpersonal relationship, driving, shopping, community activity, occupation, and yard work  PERSONAL FACTORS Fitness, Past/current experiences, Time since onset of injury/illness/exacerbation, and 1 comorbidity: PTSD, depression, anxiety  are also affecting patient's functional outcome.   REHAB POTENTIAL: Fair due to multiple injuries and deformities post surgery  CLINICAL DECISION MAKING: Evolving/moderate complexity  EVALUATION COMPLEXITY: Moderate   GOALS: Goals reviewed with patient? Yes  SHORT TERM GOALS: Target date: 01/11/2022  Pain report to be no greater than 5/10. Baseline: Goal status: IN PROGRESS  2.  Patient will be independent with initial HEP  Baseline:  Goal status: IN PROGRESS  LONG TERM GOALS: Target date: 02/08/2022  Patient to be independent with advanced HEP  Baseline:  Goal status: IN PROGRESS  2.  Patient to report pain no increase in pain while performing desired community tasks. Baseline:  Goal status: IN PROGRESS  3.  FOTO to improve to 54% to demonstrate improvements in functional tasks. Baseline: 46% Goal status: IN PROGRESS  4.  Lumbar ROM to improve to [facility] on all planes of motion Baseline:  Goal status: IN PROGRESS  5.  Patient to be able to start a walking program and increase by 1/4 mile per session.   Baseline:  Goal status: IN PROGRESS     PLAN: PT FREQUENCY: 2x/week  PT DURATION: 8 weeks  PLANNED INTERVENTIONS: Therapeutic exercises, Therapeutic activity, Neuromuscular re-education, Balance training, Gait training, Patient/Family education, Self Care, Joint mobilization, Joint manipulation, Aquatic Therapy, Dry Needling, Electrical stimulation, Spinal manipulation, Spinal mobilization, Cryotherapy, Moist heat, Taping, Traction, Ultrasound, Manual therapy, and Re-evaluation.  PLAN FOR NEXT SESSION:   Re-assessment next visit,  aquatic PT as indicated, attempt to progress core exercises, manual therapy/dry needling, as indicated; tape as needed

## 2022-02-07 ENCOUNTER — Ambulatory Visit: Payer: Medicare Other | Attending: Sports Medicine

## 2022-02-07 DIAGNOSIS — R293 Abnormal posture: Secondary | ICD-10-CM | POA: Diagnosis present

## 2022-02-07 DIAGNOSIS — R252 Cramp and spasm: Secondary | ICD-10-CM | POA: Insufficient documentation

## 2022-02-07 DIAGNOSIS — R262 Difficulty in walking, not elsewhere classified: Secondary | ICD-10-CM | POA: Diagnosis present

## 2022-02-07 DIAGNOSIS — M6281 Muscle weakness (generalized): Secondary | ICD-10-CM | POA: Diagnosis present

## 2022-02-07 DIAGNOSIS — M5459 Other low back pain: Secondary | ICD-10-CM | POA: Diagnosis present

## 2022-02-07 NOTE — Therapy (Addendum)
OUTPATIENT PHYSICAL THERAPY TREATMENT NOTE   Progress Note Reporting Period 12/13/21 to 02/07/22  See note below for Objective Data and Assessment of Progress/Goals.     Patient Name: Lacey French MRN: [id number] DOB:11/13/1965, 56 y.o., female Today's Date: 02/07/2022   PT End of Session - 02/07/22 1028     Visit Number 15    Date for PT Re-Evaluation 04/04/22    Authorization Type Medicare    Progress Note Due on Visit 20    PT Start Time 1015    PT Stop Time 1100    PT Time Calculation (min) 45 min    Activity Tolerance Patient limited by pain    Behavior During Therapy [facility] for tasks assessed/performed                 Past Medical History:  Diagnosis Date   Allergy    Anxiety    Arthritis    Asthma    COPD (chronic obstructive pulmonary disease) ([place])    Depression    Hyperlipidemia    PTSD (post-traumatic stress disorder)    Past Surgical History:  Procedure Laterality Date   BREAST LUMPECTOMY Left    2, both benign   BREAST SURGERY     REDUCTION   COLONOSCOPY N/A 03/15/2021   Procedure: COLONOSCOPY;  Surgeon: Otis Brace, MD;  Location: [facility];  Service: Gastroenterology;  Laterality: N/A;  pt Allergic to Propofol   JOINT REPLACEMENT     L shoulder, L knee   POLYPECTOMY  03/15/2021   Procedure: POLYPECTOMY;  Surgeon: Otis Brace, MD;  Location: [facility];  Service: Gastroenterology;;   REDUCTION MAMMAPLASTY Bilateral    TONSILLECTOMY     Patient Active Problem List   Diagnosis Date Noted   Acute pain of left knee 11/03/2020   Acute left ankle pain 11/03/2020   Left hand pain 11/03/2020   Carpal tunnel syndrome of left wrist 05/16/2017   PTSD (post-traumatic stress disorder) 04/10/2016   Psychosis ([place]) 04/10/2016   Anxiety 04/10/2016   Allergy 04/10/2016   Asthma 04/10/2016   Depression 04/10/2016    PCP: Kathyrn Lass, MD  REFERRING PROVIDER: Inez Catalina, MD   REFERRING DIAG: M54.50 (ICD-10-CM) - Low back pain,  unspecified   Rationale for Evaluation and Treatment Rehabilitation  THERAPY DIAG:  Other low back pain - Plan: PT plan of care cert/re-cert  Muscle weakness (generalized) - Plan: PT plan of care cert/re-cert  Difficulty in walking, not elsewhere classified - Plan: PT plan of care cert/re-cert  Cramp and spasm - Plan: PT plan of care cert/re-cert  Abnormal posture - Plan: PT plan of care cert/re-cert  ONSET DATE: 41/42/3953  SUBJECTIVE:  SUBJECTIVE STATEMENT: Patient reports she saw MD yesterday and he suggested ESI.  Patient is fearful due to having them done in [place] where they sedated her and she had a reaction to the "propofol" she recalls.  They had to resuscitate her.   She is unsure about therapy.  She feels like she is still in a significant amount of pain.  She admits she gets some minor relief with hanging unweighted on the noodles in the pool.  She feels like without the therapy, she won't make any improvement.  She had injection in left knee yesterday.   PERTINENT HISTORY:  Injured in [place] of [facility] on 9/11  PAIN:  Are you having pain? Yes: NPRS scale: 5/10 Pain location: low back Pain description: aching Aggravating factors: hurts all the time Relieving factors: nothing   PRECAUTIONS: None  WEIGHT BEARING RESTRICTIONS No  FALLS:  Has patient fallen in last 6 months? No  LIVING ENVIRONMENT: Lives with: lives with their family Lives in: House/apartment   OCCUPATION: disabled  PLOF: Independent, Independent with basic ADLs, Independent with household mobility without device, Independent with community mobility without device, Independent with homemaking with ambulation, Independent with gait, and Independent with transfers  PATIENT GOALS She hopes to gain relief for  her back and knees.    OBJECTIVE:   DIAGNOSTIC FINDINGS:  None listed  PATIENT SURVEYS:  Eval:  FOTO  46% (projected 54% by visit 12) 01/19/22: 53% 02/07/22: 36%  SCREENING FOR RED FLAGS: Bowel or bladder incontinence: No Spinal tumors: No Cauda equina syndrome: No Compression fracture: No Abdominal aneurysm: No  COGNITION:  Overall cognitive status: Within functional limits for tasks assessed and emotional distress due to 9/11 events      SENSATION: [facility]  MUSCLE LENGTH: Hamstrings: Right 60 deg; Left 50 deg Thomas test: Right pos ; Left pos   01/19/22: patient too heavily guarded  02/07/22: patient still heavily guarded  POSTURE: rounded shoulders and decreased lumbar lordosis  PALPATION: Crepitus bilateral knees  LUMBAR ROM:   Active  A/PROM  eval A/PROM 01/19/22 A/PROM 02/07/22  Flexion Fingertips to mid shin Fingertips to mid thigh Fingertips to mid patella  Extension WFL Pain with minimal ext [facility] but with pain  Right lateral flexion Fingertips to just above joint line Fingertips to mid thigh Fingertips to distal thigh  Left lateral flexion Fingertips to just above joint line Fingertips to just above mid thigh Fingertips to distal thigh  Right rotation 50% 25% 25%  Left rotation 50% 25% 25%   (Blank rows = not tested)  LOWER EXTREMITY ROM:     WFL with exception of bilateral knee flexion limited to 100 to 120 degrees of flexion comfortably  LOWER EXTREMITY MMT:    Generally 4 to 4-/5 t/o bilateral LE's 01/19/22: Patient too guarded/ inconclusive 02/07/22: Inconclusive  LUMBAR SPECIAL TESTS:  Straight leg raise test: Negative  FUNCTIONAL TESTS:  Eval: 5 times sit to stand: time contraints - not completed Timed up and go (TUG): not completed  12/16/2021:  unable to complete secondary to increased pain.  01/19/22: 5 times sit to stand: time contraints : 54.74 sec Timed up and go (TUG): 35.76 sec  02/07/22: 5 times sit to stand: time  contraints : 44.60 sec Timed up and go (TUG): 25.68 sec  GAIT: Distance walked: 50 Assistive device utilized: None Level of assistance: Complete Independence Comments: antalgic    TODAY'S TREATMENT: 02/07/22: Nustep x 5 min level 4 PPT x 20 Trunk rotation in hook  lying x 10 each side PPT with march x 20 Hamstring stretch (supine) 3 x 30 sec each LE (verbal cues to find more of an isolated hamstring stretch by flexing the knee) IT band stretch (supine) 3 x 30 sec each LE Reassessment for recert: see above  28/41/32: PPT x 20 Seated piriformis stretch 3 x 10 sec (using strap, patient unable to get ankle on top of knee) Hamstring stretch (supine) 3 x 30 sec each LE (verbal cues to find more of an isolated hamstring stretch by flexing the knee) IT band stretch (supine) 3 x 30 sec each LE Trunk rotation x 10 each side Open book stretch x 10 each side PPT with march x 20 Ice pack to lumbar spine x 10 min in hook lying  01/31/22: Pt seen for aquatic therapy today.  Treatment took place in water 3.25-4.5 ft in depth at the [facility]. Temp of water was 92.  Pt entered/exited the pool independently via steps with hand rail.  In 4 ft 8" water: - with thick square noodle under arms:  backward/ forward walking (slow, and to tolerance), SLS with small circumduction of LE, heel raises x 4 ( not tolerated in LLE); side stepping with very small steps  - sitting on thick square noodle, LE suspended:  breast stroke arms, bilat shoulder addct/abdct; bilat shoulder horz abdct; doggie paddle (not tolerated) -return to walking with hands supported on thick square noodle, forward / backwards - plank with hands on bench in water; repeated with gentle alternating hip and knee flexion ( 3 reps, stopped due to increase pain with WB in LLE while moving RLE forward   - return to plank position with noodle under arms - suspended with blue thick noodle under arms for decompression - TrA set  with short blue noodle pull down x 10  When pt was dried off: I strips of reg Rock tape applied to bilat paraspinals from bra strap ~T10 to superior glute max with 20% stretch to increase proprioception and assist with desensitization.       Pt requires the buoyancy and hydrostatic pressure of water for support, and to offload joints by unweighting joint load by at least 50 % in navel deep water and by at least 75-80% in chest to neck deep water.  Viscosity of the water is needed for resistance of strengthening. Water current perturbations provides challenge to standing balance requiring increased core activation.    PATIENT EDUCATION:  Education details: Aquatics progressions/modifications  Person educated: Patient Education method: Explanation, Demonstration, Corporate treasurer cues, Verbal cues, and Handouts Education comprehension: verbalized understanding, returned demonstration, verbal cues required, and needs further education   HOME EXERCISE PROGRAM: Access Code: [email or web address] URL: [email or web address] Date: 01/19/2022 Prepared by: Candyce Churn  Exercises - Standing Hamstring Stretch on Chair  - 1 x daily - 7 x weekly - 1 sets - 3 reps - 30 hold - Supine Hamstring Stretch with Strap  - 1 x daily - 7 x weekly - 1 sets - 3 reps - 30 hold - Supine ITB Stretch with Strap  - 1 x daily - 7 x weekly - 1 sets - 3 reps - 30  hold - Supine Figure 4 Piriformis Stretch  - 1 x daily - 7 x weekly - 1 sets - 3 reps - 30 hold - Supine Posterior Pelvic Tilt  - 1 x daily - 7 x weekly - 3 sets - 10 reps - Supine Piriformis Stretch with Towel  - 1 x  daily - 7 x weekly - 1 sets - 2 reps - 20 sec hold - Standing 'L' Stretch at Counter  - 1 x daily - 7 x weekly - 1 sets - 2 reps - 20 sec hold - Supine 90/90 Alternating Heel Touches with Posterior Pelvic Tilt  - 1 x daily - 7 x weekly - 1 sets - 20 repsAccess Code: [email or web address] URL: [email or web address] Date: 12/29/2021 Prepared by:  Shelby Dubin Menke   ASSESSMENT:  CLINICAL IMPRESSION: Suzi Roots continues to move about as if she were 9-10/10 pain and is not making significant progress but her pain level reports have been 4-5/10.  She has very low tolerance to any activity we do on land but she attempts to complete all.  Stretching is painful but she is trying to push through the stretches consistently.    She does not respond to dry needling to assist with muscle elongation.  She tolerates pool better than land but is still heavily guarded in the pool.  Deb saw MD yesterday and she was offered ESI.  She is hesitant due to a bad experience a few years ago.  She was sedated for the injections and had a reaction to the propofol and had to be resuscitated.   PT explained that we do not believe that they sedate patients for [facility] here but to communicate with the doctor about this.  She plans on asking about this and will consider the injections.  At this time we will plan on continuing due to patient seems to have made slight improvements in her functional scores.  If patient decides against the injections and does not make any significant improvements in the next 4 weeks, we will DC.     OBJECTIVE IMPAIRMENTS Abnormal gait, decreased strength, increased muscle spasms, impaired flexibility, postural dysfunction, and pain.   ACTIVITY LIMITATIONS carrying, lifting, bending, sitting, standing, squatting, sleeping, stairs, transfers, bed mobility, and dressing  PARTICIPATION LIMITATIONS: meal prep, cleaning, laundry, interpersonal relationship, driving, shopping, community activity, occupation, and yard work  PERSONAL FACTORS Fitness, Past/current experiences, Time since onset of injury/illness/exacerbation, and 1 comorbidity: PTSD, depression, anxiety  are also affecting patient's functional outcome.   REHAB POTENTIAL: Fair due to multiple injuries and deformities post surgery  CLINICAL DECISION MAKING: Evolving/moderate complexity  EVALUATION  COMPLEXITY: Moderate   GOALS: Goals reviewed with patient? Yes  SHORT TERM GOALS: Target date: 01/11/2022  Pain report to be no greater than 5/10. Baseline: Goal status: MET  2.  Patient will be independent with initial HEP  Baseline:  Goal status: MET  LONG TERM GOALS: Target date: 04/04/2022  Patient to be independent with advanced HEP  Baseline:  Goal status: IN PROGRESS  2.  Patient to report pain no increase in pain while performing desired community tasks. Baseline:  Goal status: IN PROGRESS  3.  FOTO to improve to 54% to demonstrate improvements in functional tasks. Baseline: 46% Goal status: IN PROGRESS  4.  Lumbar ROM to improve to [facility] on all planes of motion Baseline:  Goal status: IN PROGRESS  5.  Patient to be able to start a walking program and increase by 1/4 mile per session.   Baseline:  Goal status: IN PROGRESS     PLAN: PT FREQUENCY: 2x/week  PT DURATION: 8 weeks  PLANNED INTERVENTIONS: Therapeutic exercises, Therapeutic activity, Neuromuscular re-education, Balance training, Gait training, Patient/Family education, Self Care, Joint mobilization, Joint manipulation, Aquatic Therapy, Dry Needling, Electrical stimulation, Spinal manipulation, Spinal mobilization, Cryotherapy, Moist heat, Taping, Traction, Ultrasound, Manual therapy,  and Re-evaluation.  PLAN FOR NEXT SESSION:  At this time we will plan on continuing due to patient seems to have made slight improvements in her functional scores.  If patient decides against the injections and does not make any significant improvements in the next 4 weeks, we will DC.

## 2022-02-07 NOTE — Therapy (Incomplete Revision)
OUTPATIENT PHYSICAL THERAPY TREATMENT NOTE   Progress Note Reporting Period *** to ***  See note below for Objective Data and Assessment of Progress/Goals.     Patient Name: Lacey French MRN: [id number] DOB:1966-02-24, 56 y.o., female Today's Date: 02/07/2022   PT End of Session - 02/07/22 1028     Visit Number 15    Date for PT Re-Evaluation 04/04/22    Authorization Type Medicare    Progress Note Due on Visit 20    PT Start Time 1015    PT Stop Time 1100    PT Time Calculation (min) 45 min    Activity Tolerance Patient limited by pain    Behavior During Therapy [facility] for tasks assessed/performed                 Past Medical History:  Diagnosis Date   Allergy    Anxiety    Arthritis    Asthma    COPD (chronic obstructive pulmonary disease) ([place])    Depression    Hyperlipidemia    PTSD (post-traumatic stress disorder)    Past Surgical History:  Procedure Laterality Date   BREAST LUMPECTOMY Left    2, both benign   BREAST SURGERY     REDUCTION   COLONOSCOPY N/A 03/15/2021   Procedure: COLONOSCOPY;  Surgeon: Otis Brace, MD;  Location: [facility];  Service: Gastroenterology;  Laterality: N/A;  pt Allergic to Propofol   JOINT REPLACEMENT     L shoulder, L knee   POLYPECTOMY  03/15/2021   Procedure: POLYPECTOMY;  Surgeon: Otis Brace, MD;  Location: [facility];  Service: Gastroenterology;;   REDUCTION MAMMAPLASTY Bilateral    TONSILLECTOMY     Patient Active Problem List   Diagnosis Date Noted   Acute pain of left knee 11/03/2020   Acute left ankle pain 11/03/2020   Left hand pain 11/03/2020   Carpal tunnel syndrome of left wrist 05/16/2017   PTSD (post-traumatic stress disorder) 04/10/2016   Psychosis ([place]) 04/10/2016   Anxiety 04/10/2016   Allergy 04/10/2016   Asthma 04/10/2016   Depression 04/10/2016    PCP: Kathyrn Lass, MD  REFERRING PROVIDER: Inez Catalina, MD   REFERRING DIAG: M54.50 (ICD-10-CM) - Low back pain,  unspecified   Rationale for Evaluation and Treatment Rehabilitation  THERAPY DIAG:  Other low back pain - Plan: PT plan of care cert/re-cert  Muscle weakness (generalized) - Plan: PT plan of care cert/re-cert  Difficulty in walking, not elsewhere classified - Plan: PT plan of care cert/re-cert  Cramp and spasm - Plan: PT plan of care cert/re-cert  Abnormal posture - Plan: PT plan of care cert/re-cert  ONSET DATE: 40/98/1191  SUBJECTIVE:  SUBJECTIVE STATEMENT: Patient reports she saw MD yesterday and he suggested ESI.  Patient is fearful due to having them done in [place] where they sedated her and she had a reaction to the "propofol" she recalls.  They had to resuscitate her.   She is unsure about therapy.  She feels like she is still in a significant amount of pain.  She admits she gets some minor relief with hanging unweighted on the noodles in the pool.  She feels like without the therapy, she won't make any improvement.  She had injection in left knee yesterday.   PERTINENT HISTORY:  Injured in [place] of [facility] on 9/11  PAIN:  Are you having pain? Yes: NPRS scale: 5/10 Pain location: low back Pain description: aching Aggravating factors: hurts all the time Relieving factors: nothing   PRECAUTIONS: None  WEIGHT BEARING RESTRICTIONS No  FALLS:  Has patient fallen in last 6 months? No  LIVING ENVIRONMENT: Lives with: lives with their family Lives in: House/apartment   OCCUPATION: disabled  PLOF: Independent, Independent with basic ADLs, Independent with household mobility without device, Independent with community mobility without device, Independent with homemaking with ambulation, Independent with gait, and Independent with transfers  PATIENT GOALS She hopes to gain relief for  her back and knees.    OBJECTIVE:   DIAGNOSTIC FINDINGS:  None listed  PATIENT SURVEYS:  Eval:  FOTO  46% (projected 54% by visit 12) 01/19/22: 53% 02/07/22: 36%  SCREENING FOR RED FLAGS: Bowel or bladder incontinence: No Spinal tumors: No Cauda equina syndrome: No Compression fracture: No Abdominal aneurysm: No  COGNITION:  Overall cognitive status: Within functional limits for tasks assessed and emotional distress due to 9/11 events      SENSATION: [facility]  MUSCLE LENGTH: Hamstrings: Right 60 deg; Left 50 deg Thomas test: Right pos ; Left pos   01/19/22: patient too heavily guarded  02/07/22: patient still heavily guarded  POSTURE: rounded shoulders and decreased lumbar lordosis  PALPATION: Crepitus bilateral knees  LUMBAR ROM:   Active  A/PROM  eval A/PROM 01/19/22 A/PROM 02/07/22  Flexion Fingertips to mid shin Fingertips to mid thigh Fingertips to mid patella  Extension WFL Pain with minimal ext [facility] but with pain  Right lateral flexion Fingertips to just above joint line Fingertips to mid thigh Fingertips to distal thigh  Left lateral flexion Fingertips to just above joint line Fingertips to just above mid thigh Fingertips to distal thigh  Right rotation 50% 25% 25%  Left rotation 50% 25% 25%   (Blank rows = not tested)  LOWER EXTREMITY ROM:     WFL with exception of bilateral knee flexion limited to 100 to 120 degrees of flexion comfortably  LOWER EXTREMITY MMT:    Generally 4 to 4-/5 t/o bilateral LE's 01/19/22: Patient too guarded/ inconclusive 02/07/22: Inconclusive  LUMBAR SPECIAL TESTS:  Straight leg raise test: Negative  FUNCTIONAL TESTS:  Eval: 5 times sit to stand: time contraints - not completed Timed up and go (TUG): not completed  12/16/2021:  unable to complete secondary to increased pain.  01/19/22: 5 times sit to stand: time contraints : 54.74 sec Timed up and go (TUG): 35.76 sec  02/07/22: 5 times sit to stand: time  contraints : 44.60 sec Timed up and go (TUG): 25.68 sec  GAIT: Distance walked: 50 Assistive device utilized: None Level of assistance: Complete Independence Comments: antalgic    TODAY'S TREATMENT: 02/07/22: Nustep x 5 min level 4 PPT x 20 Trunk rotation in hook  lying x 10 each side PPT with march x 20 Hamstring stretch (supine) 3 x 30 sec each LE (verbal cues to find more of an isolated hamstring stretch by flexing the knee) IT band stretch (supine) 3 x 30 sec each LE Reassessment for recert: see above  28/41/32: PPT x 20 Seated piriformis stretch 3 x 10 sec (using strap, patient unable to get ankle on top of knee) Hamstring stretch (supine) 3 x 30 sec each LE (verbal cues to find more of an isolated hamstring stretch by flexing the knee) IT band stretch (supine) 3 x 30 sec each LE Trunk rotation x 10 each side Open book stretch x 10 each side PPT with march x 20 Ice pack to lumbar spine x 10 min in hook lying  01/31/22: Pt seen for aquatic therapy today.  Treatment took place in water 3.25-4.5 ft in depth at the [facility]. Temp of water was 92.  Pt entered/exited the pool independently via steps with hand rail.  In 4 ft 8" water: - with thick square noodle under arms:  backward/ forward walking (slow, and to tolerance), SLS with small circumduction of LE, heel raises x 4 ( not tolerated in LLE); side stepping with very small steps  - sitting on thick square noodle, LE suspended:  breast stroke arms, bilat shoulder addct/abdct; bilat shoulder horz abdct; doggie paddle (not tolerated) -return to walking with hands supported on thick square noodle, forward / backwards - plank with hands on bench in water; repeated with gentle alternating hip and knee flexion ( 3 reps, stopped due to increase pain with WB in LLE while moving RLE forward   - return to plank position with noodle under arms - suspended with blue thick noodle under arms for decompression - TrA set  with short blue noodle pull down x 10  When pt was dried off: I strips of reg Rock tape applied to bilat paraspinals from bra strap ~T10 to superior glute max with 20% stretch to increase proprioception and assist with desensitization.       Pt requires the buoyancy and hydrostatic pressure of water for support, and to offload joints by unweighting joint load by at least 50 % in navel deep water and by at least 75-80% in chest to neck deep water.  Viscosity of the water is needed for resistance of strengthening. Water current perturbations provides challenge to standing balance requiring increased core activation.    PATIENT EDUCATION:  Education details: Aquatics progressions/modifications  Person educated: Patient Education method: Explanation, Demonstration, Corporate treasurer cues, Verbal cues, and Handouts Education comprehension: verbalized understanding, returned demonstration, verbal cues required, and needs further education   HOME EXERCISE PROGRAM: Access Code: [email or web address] URL: [email or web address] Date: 01/19/2022 Prepared by: Candyce Churn  Exercises - Standing Hamstring Stretch on Chair  - 1 x daily - 7 x weekly - 1 sets - 3 reps - 30 hold - Supine Hamstring Stretch with Strap  - 1 x daily - 7 x weekly - 1 sets - 3 reps - 30 hold - Supine ITB Stretch with Strap  - 1 x daily - 7 x weekly - 1 sets - 3 reps - 30  hold - Supine Figure 4 Piriformis Stretch  - 1 x daily - 7 x weekly - 1 sets - 3 reps - 30 hold - Supine Posterior Pelvic Tilt  - 1 x daily - 7 x weekly - 3 sets - 10 reps - Supine Piriformis Stretch with Towel  - 1 x  daily - 7 x weekly - 1 sets - 2 reps - 20 sec hold - Standing 'L' Stretch at Counter  - 1 x daily - 7 x weekly - 1 sets - 2 reps - 20 sec hold - Supine 90/90 Alternating Heel Touches with Posterior Pelvic Tilt  - 1 x daily - 7 x weekly - 1 sets - 20 repsAccess Code: [email or web address] URL: [email or web address] Date: 12/29/2021 Prepared by:  Shelby Dubin Menke   ASSESSMENT:  CLINICAL IMPRESSION: Suzi Roots continues to move about as if she were 9-10/10 pain and is not making significant progress but her pain level reports have been 4-5/10.  She has very low tolerance to any activity we do on land but she attempts to complete all.  Stretching is painful but she is trying to push through the stretches consistently.    She does not respond to dry needling to assist with muscle elongation.  She tolerates pool better than land but is still heavily guarded in the pool.  Deb saw MD yesterday and she was offered ESI.  She is hesitant due to a bad experience a few years ago.  She was sedated for the injections and had a reaction to the propofol and had to be resuscitated.   PT explained that we do not believe that they sedate patients for [facility] here but to communicate with the doctor about this.  She plans on asking about this and will consider the injections.  At this time we will plan on continuing due to patient seems to have made slight improvements in her functional scores.  If patient decides against the injections and does not make any significant improvements in the next 4 weeks, we will DC.     OBJECTIVE IMPAIRMENTS Abnormal gait, decreased strength, increased muscle spasms, impaired flexibility, postural dysfunction, and pain.   ACTIVITY LIMITATIONS carrying, lifting, bending, sitting, standing, squatting, sleeping, stairs, transfers, bed mobility, and dressing  PARTICIPATION LIMITATIONS: meal prep, cleaning, laundry, interpersonal relationship, driving, shopping, community activity, occupation, and yard work  PERSONAL FACTORS Fitness, Past/current experiences, Time since onset of injury/illness/exacerbation, and 1 comorbidity: PTSD, depression, anxiety  are also affecting patient's functional outcome.   REHAB POTENTIAL: Fair due to multiple injuries and deformities post surgery  CLINICAL DECISION MAKING: Evolving/moderate complexity  EVALUATION  COMPLEXITY: Moderate   GOALS: Goals reviewed with patient? Yes  SHORT TERM GOALS: Target date: 01/11/2022  Pain report to be no greater than 5/10. Baseline: Goal status: MET  2.  Patient will be independent with initial HEP  Baseline:  Goal status: MET  LONG TERM GOALS: Target date: 04/04/2022  Patient to be independent with advanced HEP  Baseline:  Goal status: IN PROGRESS  2.  Patient to report pain no increase in pain while performing desired community tasks. Baseline:  Goal status: IN PROGRESS  3.  FOTO to improve to 54% to demonstrate improvements in functional tasks. Baseline: 46% Goal status: IN PROGRESS  4.  Lumbar ROM to improve to [facility] on all planes of motion Baseline:  Goal status: IN PROGRESS  5.  Patient to be able to start a walking program and increase by 1/4 mile per session.   Baseline:  Goal status: IN PROGRESS     PLAN: PT FREQUENCY: 2x/week  PT DURATION: 8 weeks  PLANNED INTERVENTIONS: Therapeutic exercises, Therapeutic activity, Neuromuscular re-education, Balance training, Gait training, Patient/Family education, Self Care, Joint mobilization, Joint manipulation, Aquatic Therapy, Dry Needling, Electrical stimulation, Spinal manipulation, Spinal mobilization, Cryotherapy, Moist heat, Taping, Traction, Ultrasound, Manual therapy,  and Re-evaluation.  PLAN FOR NEXT SESSION:  At this time we will plan on continuing due to patient seems to have made slight improvements in her functional scores.  If patient decides against the injections and does not make any significant improvements in the next 4 weeks, we will DC.

## 2022-02-08 ENCOUNTER — Ambulatory Visit: Payer: Medicare Other | Admitting: Rehabilitative and Restorative Service Providers"

## 2022-02-08 ENCOUNTER — Encounter: Payer: Self-pay | Admitting: Rehabilitative and Restorative Service Providers"

## 2022-02-08 DIAGNOSIS — M5459 Other low back pain: Secondary | ICD-10-CM

## 2022-02-08 DIAGNOSIS — R293 Abnormal posture: Secondary | ICD-10-CM

## 2022-02-08 DIAGNOSIS — R262 Difficulty in walking, not elsewhere classified: Secondary | ICD-10-CM

## 2022-02-08 DIAGNOSIS — R252 Cramp and spasm: Secondary | ICD-10-CM

## 2022-02-08 DIAGNOSIS — M6281 Muscle weakness (generalized): Secondary | ICD-10-CM

## 2022-02-08 NOTE — Therapy (Signed)
OUTPATIENT PHYSICAL THERAPY TREATMENT NOTE    Patient Name: Lacey French MRN: [id number] DOB:1965/08/07, 56 y.o., female Today's Date: 02/08/2022   PT End of Session - 02/08/22 0807     Visit Number 16    Date for PT Re-Evaluation 04/04/22    Authorization Type Medicare    Progress Note Due on Visit 20    PT Start Time 0800    PT Stop Time 0840    PT Time Calculation (min) 40 min    Activity Tolerance Patient limited by pain    Behavior During Therapy [facility] for tasks assessed/performed                 Past Medical History:  Diagnosis Date   Allergy    Anxiety    Arthritis    Asthma    COPD (chronic obstructive pulmonary disease) ([place])    Depression    Hyperlipidemia    PTSD (post-traumatic stress disorder)    Past Surgical History:  Procedure Laterality Date   BREAST LUMPECTOMY Left    2, both benign   BREAST SURGERY     REDUCTION   COLONOSCOPY N/A 03/15/2021   Procedure: COLONOSCOPY;  Surgeon: Otis Brace, MD;  [facility]: [facility];  Service: Gastroenterology;  Laterality: N/A;  pt Allergic to Propofol   JOINT REPLACEMENT     L shoulder, L knee   POLYPECTOMY  03/15/2021   Procedure: POLYPECTOMY;  Surgeon: Otis Brace, MD;  [facility]: [facility];  Service: Gastroenterology;;   REDUCTION MAMMAPLASTY Bilateral    TONSILLECTOMY     Patient Active Problem List   Diagnosis Date Noted   Acute pain of left knee 11/03/2020   Acute left ankle pain 11/03/2020   Left hand pain 11/03/2020   Carpal tunnel syndrome of left wrist 05/16/2017   PTSD (post-traumatic stress disorder) 04/10/2016   Psychosis ([place]) 04/10/2016   Anxiety 04/10/2016   Allergy 04/10/2016   Asthma 04/10/2016   Depression 04/10/2016    PCP: Kathyrn Lass, MD  REFERRING PROVIDER: Inez Catalina, MD   REFERRING DIAG: M54.50 (ICD-10-CM) - Low back pain, unspecified   Rationale for Evaluation and Treatment Rehabilitation  THERAPY DIAG:  Other low back pain  Muscle weakness  (generalized)  Difficulty in walking, not elsewhere classified  Cramp and spasm  Abnormal posture  ONSET DATE: 12/07/2021  SUBJECTIVE:                                                                                                                                                                                           SUBJECTIVE STATEMENT: Patient reports she woke up feeling really stiff.  "It feels  like something is stuck"  PERTINENT HISTORY:  Injured in [place] [facility] on 9/11  PAIN:  Are you having pain? Yes: NPRS scale: 5/10 Pain location: low back Pain description: aching Aggravating factors: hurts all the time Relieving factors: nothing   PRECAUTIONS: None  WEIGHT BEARING RESTRICTIONS No  FALLS:  Has patient fallen in last 6 months? No  LIVING ENVIRONMENT: Lives with: lives with their family Lives in: House/apartment   OCCUPATION: disabled  PLOF: Independent, Independent with basic ADLs, Independent with household mobility without device, Independent with community mobility without device, Independent with homemaking with ambulation, Independent with gait, and Independent with transfers  PATIENT GOALS She hopes to gain relief for her back and knees.    OBJECTIVE:   DIAGNOSTIC FINDINGS:  None listed  PATIENT SURVEYS:  Eval:  FOTO  46% (projected 54% by visit 12) 01/19/22: 53% 02/07/22: 36%  SCREENING FOR RED FLAGS: Bowel or bladder incontinence: No Spinal tumors: No Cauda equina syndrome: No Compression fracture: No Abdominal aneurysm: No  COGNITION:  Overall cognitive status: Within functional limits for tasks assessed and emotional distress due to 9/11 events      SENSATION: [facility]  MUSCLE LENGTH: Hamstrings: Right 60 deg; Left 50 deg Thomas test: Right pos ; Left pos   01/19/22: patient too heavily guarded  02/07/22: patient still heavily guarded  POSTURE: rounded shoulders and decreased lumbar lordosis  PALPATION: Crepitus  bilateral knees  LUMBAR ROM:   Active  A/PROM  eval A/PROM 01/19/22 A/PROM 02/07/22  Flexion Fingertips to mid shin Fingertips to mid thigh Fingertips to mid patella  Extension WFL Pain with minimal ext [facility] but with pain  Right lateral flexion Fingertips to just above joint line Fingertips to mid thigh Fingertips to distal thigh  Left lateral flexion Fingertips to just above joint line Fingertips to just above mid thigh Fingertips to distal thigh  Right rotation 50% 25% 25%  Left rotation 50% 25% 25%   (Blank rows = not tested)  LOWER EXTREMITY ROM:     WFL with exception of bilateral knee flexion limited to 100 to 120 degrees of flexion comfortably  LOWER EXTREMITY MMT:    Generally 4 to 4-/5 t/o bilateral LE's 01/19/22: Patient too guarded/ inconclusive 02/07/22: Inconclusive  LUMBAR SPECIAL TESTS:  Straight leg raise test: Negative  FUNCTIONAL TESTS:  Eval: 5 times sit to stand: time contraints - not completed Timed up and go (TUG): not completed  12/16/2021:  unable to complete secondary to increased pain.  01/19/22: 5 times sit to stand: time contraints : 54.74 sec Timed up and go (TUG): 35.76 sec  02/07/22: 5 times sit to stand: time contraints : 44.60 sec Timed up and go (TUG): 25.68 sec  GAIT: Distance walked: 50 Assistive device utilized: None Level of assistance: Complete Independence Comments: antalgic    TODAY'S TREATMENT:  02/08/2022: Nustep level 4 x5 min with PT present to discuss status Supine posterior pelvic tilt 2x10 Supine posterior pelvic tilt with march 2x10 Hamstring stretch (supine) 3 x 30 sec each LE (verbal cues to find more of an isolated hamstring stretch by flexing the knee) IT band stretch (supine) 3 x 30 sec each LE Sidelying open book x10 bilat Manual Therapy:  Grade 2/3 joint mobilizations along thoracic and lumbar spine.  Soft tissue mobilization along lumbar paraspinals.   02/07/22: Nustep x 5 min level 4 PPT x  20 Trunk rotation in hook lying x 10 each side PPT with march x 20 Hamstring stretch (supine) 3  x 30 sec each LE (verbal cues to find more of an isolated hamstring stretch by flexing the knee) IT band stretch (supine) 3 x 30 sec each LE Reassessment for recert: see above  35/45/62: PPT x 20 Seated piriformis stretch 3 x 10 sec (using strap, patient unable to get ankle on top of knee) Hamstring stretch (supine) 3 x 30 sec each LE (verbal cues to find more of an isolated hamstring stretch by flexing the knee) IT band stretch (supine) 3 x 30 sec each LE Trunk rotation x 10 each side Open book stretch x 10 each side PPT with march x 20 Ice pack to lumbar spine x 10 min in hook lying     PATIENT EDUCATION:  Education details: Aquatics progressions/modifications  Person educated: Patient Education method: Consulting civil engineer, Media planner, Corporate treasurer cues, Verbal cues, and Handouts Education comprehension: verbalized understanding, returned demonstration, verbal cues required, and needs further education   HOME EXERCISE PROGRAM: Access Code: [email or web address] URL: [email or web address] Date: 01/19/2022 Prepared by: Candyce Churn  Exercises - Standing Hamstring Stretch on Chair  - 1 x daily - 7 x weekly - 1 sets - 3 reps - 30 hold - Supine Hamstring Stretch with Strap  - 1 x daily - 7 x weekly - 1 sets - 3 reps - 30 hold - Supine ITB Stretch with Strap  - 1 x daily - 7 x weekly - 1 sets - 3 reps - 30  hold - Supine Figure 4 Piriformis Stretch  - 1 x daily - 7 x weekly - 1 sets - 3 reps - 30 hold - Supine Posterior Pelvic Tilt  - 1 x daily - 7 x weekly - 3 sets - 10 reps - Supine Piriformis Stretch with Towel  - 1 x daily - 7 x weekly - 1 sets - 2 reps - 20 sec hold - Standing 'L' Stretch at Counter  - 1 x daily - 7 x weekly - 1 sets - 2 reps - 20 sec hold - Supine 90/90 Alternating Heel Touches with Posterior Pelvic Tilt  - 1 x daily - 7 x weekly - 1 sets - 20 repsAccess Code:  [email or web address] URL: [email or web address] Date: 12/29/2021 Prepared by: Shelby Dubin Kaimana Neuzil   ASSESSMENT:  CLINICAL IMPRESSION: Deb presents to skilled PT with reports of increased tightness noted today and reporting feeling like she has a "catch" in her spine.  Patient with decreased ROM noted with sidelying open book on bilateral sides.  Pt able to tolerate prone manual therapy with joint mobilizations to improve joint mobility.  Patient did report a slight feeling of release following manual therapy, but similar pain level.  Patient to be out of town next week and will follow up with aquatic PT the following week.   OBJECTIVE IMPAIRMENTS Abnormal gait, decreased strength, increased muscle spasms, impaired flexibility, postural dysfunction, and pain.   ACTIVITY LIMITATIONS carrying, lifting, bending, sitting, standing, squatting, sleeping, stairs, transfers, bed mobility, and dressing  PARTICIPATION LIMITATIONS: meal prep, cleaning, laundry, interpersonal relationship, driving, shopping, community activity, occupation, and yard work  PERSONAL FACTORS Fitness, Past/current experiences, Time since onset of injury/illness/exacerbation, and 1 comorbidity: PTSD, depression, anxiety  are also affecting patient's functional outcome.   REHAB POTENTIAL: Fair due to multiple injuries and deformities post surgery  CLINICAL DECISION MAKING: Evolving/moderate complexity  EVALUATION COMPLEXITY: Moderate   GOALS: Goals reviewed with patient? Yes  SHORT TERM GOALS: Target date: 01/11/2022  Pain report to be no greater than 5/10. Baseline: Goal status: MET  2.  Patient  will be independent with initial HEP  Baseline:  Goal status: MET  LONG TERM GOALS: Target date: 04/04/2022  Patient to be independent with advanced HEP  Baseline:  Goal status: IN PROGRESS  2.  Patient to report pain no increase in pain while performing desired community tasks. Baseline:  Goal status: IN  PROGRESS  3.  FOTO to improve to 54% to demonstrate improvements in functional tasks. Baseline: 46% Goal status: IN PROGRESS  4.  Lumbar ROM to improve to [facility] on all planes of motion Baseline:  Goal status: IN PROGRESS  5.  Patient to be able to start a walking program and increase by 1/4 mile per session.   Baseline:  Goal status: IN PROGRESS     PLAN: PT FREQUENCY: 2x/week  PT DURATION: 8 weeks  PLANNED INTERVENTIONS: Therapeutic exercises, Therapeutic activity, Neuromuscular re-education, Balance training, Gait training, Patient/Family education, Self Care, Joint mobilization, Joint manipulation, Aquatic Therapy, Dry Needling, Electrical stimulation, Spinal manipulation, Spinal mobilization, Cryotherapy, Moist heat, Taping, Traction, Ultrasound, Manual therapy, and Re-evaluation.  PLAN FOR NEXT SESSION:  Aquatic PT   Juel Burrow, PT 02/08/22 8:59 AM  [facility] [address] [place], [place] [address] Phone # [phone] Fax [phone]

## 2022-02-21 ENCOUNTER — Ambulatory Visit (HOSPITAL_BASED_OUTPATIENT_CLINIC_OR_DEPARTMENT_OTHER): Payer: Medicare Other | Admitting: Physical Therapy

## 2022-02-28 ENCOUNTER — Ambulatory Visit (HOSPITAL_BASED_OUTPATIENT_CLINIC_OR_DEPARTMENT_OTHER): Payer: Medicare Other | Attending: Sports Medicine | Admitting: Physical Therapy

## 2022-02-28 ENCOUNTER — Encounter (HOSPITAL_BASED_OUTPATIENT_CLINIC_OR_DEPARTMENT_OTHER): Payer: Self-pay | Admitting: Physical Therapy

## 2022-02-28 DIAGNOSIS — R262 Difficulty in walking, not elsewhere classified: Secondary | ICD-10-CM | POA: Diagnosis present

## 2022-02-28 DIAGNOSIS — R252 Cramp and spasm: Secondary | ICD-10-CM | POA: Diagnosis present

## 2022-02-28 DIAGNOSIS — M6281 Muscle weakness (generalized): Secondary | ICD-10-CM | POA: Diagnosis present

## 2022-02-28 DIAGNOSIS — M5459 Other low back pain: Secondary | ICD-10-CM | POA: Diagnosis present

## 2022-02-28 DIAGNOSIS — R293 Abnormal posture: Secondary | ICD-10-CM | POA: Diagnosis present

## 2022-02-28 NOTE — Therapy (Signed)
OUTPATIENT PHYSICAL THERAPY TREATMENT NOTE    Patient Name: Lacey French MRN: [id number] DOB:Nov 25, 1965, 56 y.o., female Today's Date: 02/28/2022   PT End of Session - 02/28/22 1810     Visit Number 17    Date for PT Re-Evaluation 04/04/22    Authorization Type Medicare    Progress Note Due on Visit 74    PT Start Time 1615    PT Stop Time 1655    PT Time Calculation (min) 40 min    Activity Tolerance Patient limited by pain    Behavior During Therapy [facility] for tasks assessed/performed                  Past Medical History:  Diagnosis Date   Allergy    Anxiety    Arthritis    Asthma    COPD (chronic obstructive pulmonary disease) ([place] )    Depression    Hyperlipidemia    PTSD (post-traumatic stress disorder)    Past Surgical History:  Procedure Laterality Date   BREAST LUMPECTOMY Left    2, both benign   BREAST SURGERY     REDUCTION   COLONOSCOPY N/A 03/15/2021   Procedure: COLONOSCOPY;  Surgeon: Otis Brace, MD;  Location: [facility];  Service: Gastroenterology;  Laterality: N/A;  pt Allergic to Propofol   JOINT REPLACEMENT     L shoulder, L knee   POLYPECTOMY  03/15/2021   Procedure: POLYPECTOMY;  Surgeon: Otis Brace, MD;  Location: [facility];  Service: Gastroenterology;;   REDUCTION MAMMAPLASTY Bilateral    TONSILLECTOMY     Patient Active Problem List   Diagnosis Date Noted   Acute pain of left knee 11/03/2020   Acute left ankle pain 11/03/2020   Left hand pain 11/03/2020   Carpal tunnel syndrome of left wrist 05/16/2017   PTSD (post-traumatic stress disorder) 04/10/2016   Psychosis ([place]) 04/10/2016   Anxiety 04/10/2016   Allergy 04/10/2016   Asthma 04/10/2016   Depression 04/10/2016    PCP: Kathyrn Lass, MD  REFERRING PROVIDER: Inez Catalina, MD   REFERRING DIAG: M54.50 (ICD-10-CM) - Low back pain, unspecified   Rationale for Evaluation and Treatment Rehabilitation  THERAPY DIAG:  Other low back pain  Muscle  weakness (generalized)  Difficulty in walking, not elsewhere classified  Cramp and spasm  Abnormal posture  ONSET DATE: 12/07/2021  SUBJECTIVE:                                                                                                                                                                                           SUBJECTIVE STATEMENT: Patient continues report stiffness in low back "like something is  stuck". Pt reports that her pain level hasn't changed but she has noticed slight improvement in her mobility.   PERTINENT HISTORY:  Injured in [place] [facility] on 9/11  PAIN:  Are you having pain? Yes: NPRS scale: 5/10 Pain location: low back Pain description: aching Aggravating factors: hurts all the time Relieving factors: nothing   PRECAUTIONS: None  WEIGHT BEARING RESTRICTIONS No  FALLS:  Has patient fallen in last 6 months? No  LIVING ENVIRONMENT: Lives with: lives with their family Lives in: House/apartment   OCCUPATION: disabled  PLOF: Independent, Independent with basic ADLs, Independent with household mobility without device, Independent with community mobility without device, Independent with homemaking with ambulation, Independent with gait, and Independent with transfers  PATIENT GOALS She hopes to gain relief for her back and knees.    OBJECTIVE:   DIAGNOSTIC FINDINGS:  None listed  PATIENT SURVEYS:  Eval:  FOTO  46% (projected 54% by visit 12) 01/19/22: 53% 02/07/22: 36%  SCREENING FOR RED FLAGS: Bowel or bladder incontinence: No Spinal tumors: No Cauda equina syndrome: No Compression fracture: No Abdominal aneurysm: No  COGNITION:  Overall cognitive status: Within functional limits for tasks assessed and emotional distress due to 9/11 events      SENSATION: [facility]  MUSCLE LENGTH: Hamstrings: Right 60 deg; Left 50 deg Thomas test: Right pos ; Left pos   01/19/22: patient too heavily guarded  02/07/22: patient still  heavily guarded  POSTURE: rounded shoulders and decreased lumbar lordosis  PALPATION: Crepitus bilateral knees  LUMBAR ROM:   Active  A/PROM  eval A/PROM 01/19/22 A/PROM 02/07/22  Flexion Fingertips to mid shin Fingertips to mid thigh Fingertips to mid patella  Extension WFL Pain with minimal ext [facility] but with pain  Right lateral flexion Fingertips to just above joint line Fingertips to mid thigh Fingertips to distal thigh  Left lateral flexion Fingertips to just above joint line Fingertips to just above mid thigh Fingertips to distal thigh  Right rotation 50% 25% 25%  Left rotation 50% 25% 25%   (Blank rows = not tested)  LOWER EXTREMITY ROM:     WFL with exception of bilateral knee flexion limited to 100 to 120 degrees of flexion comfortably  LOWER EXTREMITY MMT:    Generally 4 to 4-/5 t/o bilateral LE's 01/19/22: Patient too guarded/ inconclusive 02/07/22: Inconclusive  LUMBAR SPECIAL TESTS:  Straight leg raise test: Negative  FUNCTIONAL TESTS:  Eval: 5 times sit to stand: time contraints - not completed Timed up and go (TUG): not completed  12/16/2021:  unable to complete secondary to increased pain.  01/19/22: 5 times sit to stand: time contraints : 54.74 sec Timed up and go (TUG): 35.76 sec  02/07/22: 5 times sit to stand: time contraints : 44.60 sec Timed up and go (TUG): 25.68 sec  GAIT: Distance walked: 50 Assistive device utilized: None Level of assistance: Complete Independence Comments: antalgic    TODAY'S TREATMENT: 02/28/22: Pt seen for aquatic therapy today.  Treatment took place in water 3.25-4.5 ft in depth at the [place]. Temp of water was 92.  Pt entered/exited the pool independently via steps with hand rail.   In 4 ft 8" water: - with yellow solid noodle under arms:  backward/ forward walking (slow, and to tolerance),  side stepping - standard stance with 1/2 submerged kickboard, partial push/pull - standard stance  with fins on wrists: shoulder addct/abdct, horiz abdct/add, reciprocal arm swing - reciprocal arm swing with backward gait - supported with yellow  solid noodle:  front/back toe taps x 5 each, side to side toe taps -return to suspended position with arms on solid noodle, then walking backwards; 2 sets 5 of 1/2 diamond LE - TrA set with short blue noodle pull down x 10 - on land, trial of seated Rt hip flexor stretch (at end of bench with UE support on bench and Rt knee flexed) - very limited tolerance and increased time to reach position.    Pt requires the buoyancy and hydrostatic pressure of water for support, and to offload joints by unweighting joint load by at least 50 % in navel deep water and by at least 75-80% in chest to neck deep water.  Viscosity of the water is needed for resistance of strengthening. Water current perturbations provides challenge to standing balance requiring increased core activation.  02/08/2022: Nustep level 4 x5 min with PT present to discuss status Supine posterior pelvic tilt 2x10 Supine posterior pelvic tilt with march 2x10 Hamstring stretch (supine) 3 x 30 sec each LE (verbal cues to find more of an isolated hamstring stretch by flexing the knee) IT band stretch (supine) 3 x 30 sec each LE Sidelying open book x10 bilat Manual Therapy:  Grade 2/3 joint mobilizations along thoracic and lumbar spine.  Soft tissue mobilization along lumbar paraspinals.   02/07/22: Nustep x 5 min level 4 PPT x 20 Trunk rotation in hook lying x 10 each side PPT with march x 20 Hamstring stretch (supine) 3 x 30 sec each LE (verbal cues to find more of an isolated hamstring stretch by flexing the knee) IT band stretch (supine) 3 x 30 sec each LE Reassessment for recert: see above  54/00/86: PPT x 20 Seated piriformis stretch 3 x 10 sec (using strap, patient unable to get ankle on top of knee) Hamstring stretch (supine) 3 x 30 sec each LE (verbal cues to find more of an  isolated hamstring stretch by flexing the knee) IT band stretch (supine) 3 x 30 sec each LE Trunk rotation x 10 each side Open book stretch x 10 each side PPT with march x 20 Ice pack to lumbar spine x 10 min in hook lying     PATIENT EDUCATION:  Education details: Aquatics progressions/modifications  Person educated: Patient Education method: Consulting civil engineer, Media planner, Corporate treasurer cues, Verbal cues, and Handouts Education comprehension: verbalized understanding, returned demonstration, verbal cues required, and needs further education   HOME EXERCISE PROGRAM: Access Code: [email or web address] URL: [email or web address] Date: 01/19/2022 Prepared by: Candyce Churn  Exercises - Standing Hamstring Stretch on Chair  - 1 x daily - 7 x weekly - 1 sets - 3 reps - 30 hold - Supine Hamstring Stretch with Strap  - 1 x daily - 7 x weekly - 1 sets - 3 reps - 30 hold - Supine ITB Stretch with Strap  - 1 x daily - 7 x weekly - 1 sets - 3 reps - 30  hold - Supine Figure 4 Piriformis Stretch  - 1 x daily - 7 x weekly - 1 sets - 3 reps - 30 hold - Supine Posterior Pelvic Tilt  - 1 x daily - 7 x weekly - 3 sets - 10 reps - Supine Piriformis Stretch with Towel  - 1 x daily - 7 x weekly - 1 sets - 2 reps - 20 sec hold - Standing 'L' Stretch at Counter  - 1 x daily - 7 x weekly - 1 sets - 2 reps - 20 sec hold - Supine 90/90  Alternating Heel Touches with Posterior Pelvic Tilt  - 1 x daily - 7 x weekly - 1 sets - 20 repsAccess Code: [email or web address] URL: [email or web address] Date: 12/29/2021 Prepared by: Shelby Dubin Menke   ASSESSMENT:  CLINICAL IMPRESSION: Pt with improved tolerance for side stepping and SLS while submerged 80%, than in previous visits.  She reported increased pain after completing resisted UE motions with fins on wrists while in standard stance.  Pain slightly eased with LEs suspended momentarily between exercises.  Will continue to progress as tolerated.    OBJECTIVE  IMPAIRMENTS Abnormal gait, decreased strength, increased muscle spasms, impaired flexibility, postural dysfunction, and pain.   ACTIVITY LIMITATIONS carrying, lifting, bending, sitting, standing, squatting, sleeping, stairs, transfers, bed mobility, and dressing  PARTICIPATION LIMITATIONS: meal prep, cleaning, laundry, interpersonal relationship, driving, shopping, community activity, occupation, and yard work  PERSONAL FACTORS Fitness, Past/current experiences, Time since onset of injury/illness/exacerbation, and 1 comorbidity: PTSD, depression, anxiety  are also affecting patient's functional outcome.   REHAB POTENTIAL: Fair due to multiple injuries and deformities post surgery  CLINICAL DECISION MAKING: Evolving/moderate complexity  EVALUATION COMPLEXITY: Moderate   GOALS: Goals reviewed with patient? Yes  SHORT TERM GOALS: Target date: 01/11/2022  Pain report to be no greater than 5/10. Baseline: Goal status: MET  2.  Patient will be independent with initial HEP  Baseline:  Goal status: MET  LONG TERM GOALS: Target date: 04/04/2022  Patient to be independent with advanced HEP  Baseline:  Goal status: IN PROGRESS  2.  Patient to report pain no increase in pain while performing desired community tasks. Baseline:  Goal status: IN PROGRESS  3.  FOTO to improve to 54% to demonstrate improvements in functional tasks. Baseline: 46% Goal status: IN PROGRESS  4.  Lumbar ROM to improve to [facility] on all planes of motion Baseline:  Goal status: IN PROGRESS  5.  Patient to be able to start a walking program and increase by 1/4 mile per session.   Baseline:  Goal status: IN PROGRESS     PLAN: PT FREQUENCY: 2x/week  PT DURATION: 8 weeks  PLANNED INTERVENTIONS: Therapeutic exercises, Therapeutic activity, Neuromuscular re-education, Balance training, Gait training, Patient/Family education, Self Care, Joint mobilization, Joint manipulation, Aquatic Therapy, Dry Needling,  Electrical stimulation, Spinal manipulation, Spinal mobilization, Cryotherapy, Moist heat, Taping, Traction, Ultrasound, Manual therapy, and Re-evaluation.  PLAN FOR NEXT SESSION:  Aquatic PT  Kerin Perna, PTA 02/28/22 6:17 PM [facility] [address] [place], [place], [address] Phone: [phone]   Fax:  [phone]

## 2022-03-03 ENCOUNTER — Ambulatory Visit (HOSPITAL_BASED_OUTPATIENT_CLINIC_OR_DEPARTMENT_OTHER): Payer: Medicare Other | Admitting: Physical Therapy

## 2022-03-03 ENCOUNTER — Encounter (HOSPITAL_BASED_OUTPATIENT_CLINIC_OR_DEPARTMENT_OTHER): Payer: Self-pay | Admitting: Physical Therapy

## 2022-03-03 DIAGNOSIS — M5459 Other low back pain: Secondary | ICD-10-CM

## 2022-03-03 DIAGNOSIS — R293 Abnormal posture: Secondary | ICD-10-CM

## 2022-03-03 DIAGNOSIS — R262 Difficulty in walking, not elsewhere classified: Secondary | ICD-10-CM

## 2022-03-03 DIAGNOSIS — R252 Cramp and spasm: Secondary | ICD-10-CM

## 2022-03-03 DIAGNOSIS — M6281 Muscle weakness (generalized): Secondary | ICD-10-CM

## 2022-03-03 NOTE — Therapy (Signed)
OUTPATIENT PHYSICAL THERAPY TREATMENT NOTE    Patient Name: Lacey French MRN: [id number] DOB:17-Mar-1965, 56 y.o., female Today's Date: 03/03/2022   PT End of Session - 03/03/22 0954     Visit Number 18    Date for PT Re-Evaluation 04/04/22    Authorization Type Medicare    Progress Note Due on Visit 52    PT Start Time 0945    PT Stop Time 1024    PT Time Calculation (min) 39 min    Activity Tolerance Patient limited by pain    Behavior During Therapy [facility] for tasks assessed/performed                  Past Medical History:  Diagnosis Date   Allergy    Anxiety    Arthritis    Asthma    COPD (chronic obstructive pulmonary disease) ([place])    Depression    Hyperlipidemia    PTSD (post-traumatic stress disorder)    Past Surgical History:  Procedure Laterality Date   BREAST LUMPECTOMY Left    2, both benign   BREAST SURGERY     REDUCTION   COLONOSCOPY N/A 03/15/2021   Procedure: COLONOSCOPY;  Surgeon: Otis Brace, MD;  Location: [facility];  Service: Gastroenterology;  Laterality: N/A;  pt Allergic to Propofol   JOINT REPLACEMENT     L shoulder, L knee   POLYPECTOMY  03/15/2021   Procedure: POLYPECTOMY;  Surgeon: Otis Brace, MD;  Location: [facility];  Service: Gastroenterology;;   REDUCTION MAMMAPLASTY Bilateral    TONSILLECTOMY     Patient Active Problem List   Diagnosis Date Noted   Acute pain of left knee 11/03/2020   Acute left ankle pain 11/03/2020   Left hand pain 11/03/2020   Carpal tunnel syndrome of left wrist 05/16/2017   PTSD (post-traumatic stress disorder) 04/10/2016   Psychosis ([place]) 04/10/2016   Anxiety 04/10/2016   Allergy 04/10/2016   Asthma 04/10/2016   Depression 04/10/2016    PCP: Kathyrn Lass, MD  REFERRING PROVIDER: Inez Catalina, MD   REFERRING DIAG: M54.50 (ICD-10-CM) - Low back pain, unspecified   Rationale for Evaluation and Treatment Rehabilitation  THERAPY DIAG:  Other low back pain  Muscle  weakness (generalized)  Difficulty in walking, not elsewhere classified  Cramp and spasm  Abnormal posture  ONSET DATE: 12/07/2021  SUBJECTIVE:                                                                                                                                                                                           SUBJECTIVE STATEMENT: Patient reports her pain was up to 7/10 after last  session. "I iced for a long time".  She has bought a ball and psoas release hook; it arrives tomorrow.   PERTINENT HISTORY:  Injured in [place] [facility] on 9/11  PAIN:  Are you having pain? Yes: NPRS scale: 6/10 Pain location: low back Pain description: aching Aggravating factors: hurts all the time Relieving factors: nothing   PRECAUTIONS: None  WEIGHT BEARING RESTRICTIONS No  FALLS:  Has patient fallen in last 6 months? No  LIVING ENVIRONMENT: Lives with: lives with their family Lives in: House/apartment   OCCUPATION: disabled  PLOF: Independent, Independent with basic ADLs, Independent with household mobility without device, Independent with community mobility without device, Independent with homemaking with ambulation, Independent with gait, and Independent with transfers  PATIENT GOALS She hopes to gain relief for her back and knees.    OBJECTIVE:   DIAGNOSTIC FINDINGS:  None listed  PATIENT SURVEYS:  Eval:  FOTO  46% (projected 54% by visit 12) 01/19/22: 53% 02/07/22: 36%  SCREENING FOR RED FLAGS: Bowel or bladder incontinence: No Spinal tumors: No Cauda equina syndrome: No Compression fracture: No Abdominal aneurysm: No  COGNITION:  Overall cognitive status: Within functional limits for tasks assessed and emotional distress due to 9/11 events      SENSATION: [facility]  MUSCLE LENGTH: Hamstrings: Right 60 deg; Left 50 deg Thomas test: Right pos ; Left pos   01/19/22: patient too heavily guarded  02/07/22: patient still heavily guarded  POSTURE:  rounded shoulders and decreased lumbar lordosis  PALPATION: Crepitus bilateral knees  LUMBAR ROM:   Active  A/PROM  eval A/PROM 01/19/22 A/PROM 02/07/22  Flexion Fingertips to mid shin Fingertips to mid thigh Fingertips to mid patella  Extension WFL Pain with minimal ext [facility] but with pain  Right lateral flexion Fingertips to just above joint line Fingertips to mid thigh Fingertips to distal thigh  Left lateral flexion Fingertips to just above joint line Fingertips to just above mid thigh Fingertips to distal thigh  Right rotation 50% 25% 25%  Left rotation 50% 25% 25%   (Blank rows = not tested)  LOWER EXTREMITY ROM:     WFL with exception of bilateral knee flexion limited to 100 to 120 degrees of flexion comfortably  LOWER EXTREMITY MMT:    Generally 4 to 4-/5 t/o bilateral LE's 01/19/22: Patient too guarded/ inconclusive 02/07/22: Inconclusive  LUMBAR SPECIAL TESTS:  Straight leg raise test: Negative  FUNCTIONAL TESTS:  Eval: 5 times sit to stand: time contraints - not completed Timed up and go (TUG): not completed  12/16/2021:  unable to complete secondary to increased pain.  01/19/22: 5 times sit to stand: time contraints : 54.74 sec Timed up and go (TUG): 35.76 sec  02/07/22: 5 times sit to stand: time contraints : 44.60 sec Timed up and go (TUG): 25.68 sec  GAIT: Distance walked: 50 Assistive device utilized: None Level of assistance: Complete Independence Comments: antalgic    TODAY'S TREATMENT: 03/03/22: Pt seen for aquatic therapy today.  Treatment took place in water 3.25-4.5 ft in depth at the [place]. Temp of water was 92.  Pt entered/exited the pool independently via steps with hand rail. In 4 ft 8" water: - arms in T with attempt at micro truck rotation R/L with cervical rotation - backward walking without/with short reciprocal arm swing - side stepping with short range shoulder abdct/addct R/L (L more difficult than R) -  with UE supported on yellow noodle (in front): toe taps crossing midline, toe taps into  abdct, toe taps into hip ext x 10 each -holding wall:  unilateral hip flexion with LE motion like bicycle revolution x 10 - holding short blue noodle,  tricep push downs x10, then straight arm pull downs for TrA - forward walk with row motion holding short blue noodle  (only able to take 2-3 steps due to spasms)  02/28/22: Pt seen for aquatic therapy today.  Treatment took place in water 3.25-4.5 ft in depth at the [place]. Temp of water was 92.  Pt entered/exited the pool independently via steps with hand rail.   In 4 ft 8" water: - with yellow solid noodle under arms:  backward/ forward walking (slow, and to tolerance),  side stepping - standard stance with 1/2 submerged kickboard, partial push/pull - standard stance with fins on wrists: shoulder addct/abdct, horiz abdct/add, reciprocal arm swing - reciprocal arm swing with backward gait - supported with yellow solid noodle:  front/back toe taps x 5 each, side to side toe taps -return to suspended position with arms on solid noodle, then walking backwards; 2 sets 5 of 1/2 diamond LE - TrA set with short blue noodle pull down x 10 - on land, trial of seated Rt hip flexor stretch (at end of bench with UE support on bench and Rt knee flexed) - very limited tolerance and increased time to reach position.    Pt requires the buoyancy and hydrostatic pressure of water for support, and to offload joints by unweighting joint load by at least 50 % in navel deep water and by at least 75-80% in chest to neck deep water.  Viscosity of the water is needed for resistance of strengthening. Water current perturbations provides challenge to standing balance requiring increased core activation.  02/08/2022: Nustep level 4 x5 min with PT present to discuss status Supine posterior pelvic tilt 2x10 Supine posterior pelvic tilt with march 2x10 Hamstring  stretch (supine) 3 x 30 sec each LE (verbal cues to find more of an isolated hamstring stretch by flexing the knee) IT band stretch (supine) 3 x 30 sec each LE Sidelying open book x10 bilat Manual Therapy:  Grade 2/3 joint mobilizations along thoracic and lumbar spine.  Soft tissue mobilization along lumbar paraspinals.   02/07/22: Nustep x 5 min level 4 PPT x 20 Trunk rotation in hook lying x 10 each side PPT with march x 20 Hamstring stretch (supine) 3 x 30 sec each LE (verbal cues to find more of an isolated hamstring stretch by flexing the knee) IT band stretch (supine) 3 x 30 sec each LE Reassessment for recert: see above  76/73/41: PPT x 20 Seated piriformis stretch 3 x 10 sec (using strap, patient unable to get ankle on top of knee) Hamstring stretch (supine) 3 x 30 sec each LE (verbal cues to find more of an isolated hamstring stretch by flexing the knee) IT band stretch (supine) 3 x 30 sec each LE Trunk rotation x 10 each side Open book stretch x 10 each side PPT with march x 20 Ice pack to lumbar spine x 10 min in hook lying     PATIENT EDUCATION:  Education details: Aquatics progressions/modifications  Person educated: Patient Education method: Consulting civil engineer, Media planner, Corporate treasurer cues, Verbal cues, and Handouts Education comprehension: verbalized understanding, returned demonstration, verbal cues required, and needs further education   HOME EXERCISE PROGRAM: Access Code: [email or web address] URL: [email or web address] Date: 01/19/2022 Prepared by: Candyce Churn  Exercises - Standing Hamstring Stretch on Chair  - 1 x daily -  7 x weekly - 1 sets - 3 reps - 30 hold - Supine Hamstring Stretch with Strap  - 1 x daily - 7 x weekly - 1 sets - 3 reps - 30 hold - Supine ITB Stretch with Strap  - 1 x daily - 7 x weekly - 1 sets - 3 reps - 30  hold - Supine Figure 4 Piriformis Stretch  - 1 x daily - 7 x weekly - 1 sets - 3 reps - 30 hold - Supine Posterior Pelvic  Tilt  - 1 x daily - 7 x weekly - 3 sets - 10 reps - Supine Piriformis Stretch with Towel  - 1 x daily - 7 x weekly - 1 sets - 2 reps - 20 sec hold - Standing 'L' Stretch at Counter  - 1 x daily - 7 x weekly - 1 sets - 2 reps - 20 sec hold - Supine 90/90 Alternating Heel Touches with Posterior Pelvic Tilt  - 1 x daily - 7 x weekly - 1 sets - 20 repsAccess Code: [email or web address] URL: [email or web address] Date: 12/29/2021 Prepared by: Shelby Dubin Menke   ASSESSMENT:  CLINICAL IMPRESSION: Pt with elevated pain level upon arrival and decreased tolerance while submerged 80%.  She reported increased pain with side stepping right, attempts for forward gait, and attempts to fully straighten LLE (heel on ground).   Pain slightly eased with LEs suspended momentarily between exercises.  Will continue to progress as tolerated.  Pt to trial self psoas release at home with ball; shown how to complete in standing and semi-prone with verbal understanding.    OBJECTIVE IMPAIRMENTS Abnormal gait, decreased strength, increased muscle spasms, impaired flexibility, postural dysfunction, and pain.   ACTIVITY LIMITATIONS carrying, lifting, bending, sitting, standing, squatting, sleeping, stairs, transfers, bed mobility, and dressing  PARTICIPATION LIMITATIONS: meal prep, cleaning, laundry, interpersonal relationship, driving, shopping, community activity, occupation, and yard work  PERSONAL FACTORS Fitness, Past/current experiences, Time since onset of injury/illness/exacerbation, and 1 comorbidity: PTSD, depression, anxiety  are also affecting patient's functional outcome.   REHAB POTENTIAL: Fair due to multiple injuries and deformities post surgery  CLINICAL DECISION MAKING: Evolving/moderate complexity  EVALUATION COMPLEXITY: Moderate   GOALS: Goals reviewed with patient? Yes  SHORT TERM GOALS: Target date: 01/11/2022  Pain report to be no greater than 5/10. Baseline: Goal status: MET  2.  Patient  will be independent with initial HEP  Baseline:  Goal status: MET  LONG TERM GOALS: Target date: 04/04/2022  Patient to be independent with advanced HEP  Baseline:  Goal status: IN PROGRESS  2.  Patient to report pain no increase in pain while performing desired community tasks. Baseline:  Goal status: IN PROGRESS  3.  FOTO to improve to 54% to demonstrate improvements in functional tasks. Baseline: 46% Goal status: IN PROGRESS  4.  Lumbar ROM to improve to [facility] on all planes of motion Baseline:  Goal status: IN PROGRESS  5.  Patient to be able to start a walking program and increase by 1/4 mile per session.   Baseline:  Goal status: IN PROGRESS     PLAN: PT FREQUENCY: 2x/week  PT DURATION: 8 weeks  PLANNED INTERVENTIONS: Therapeutic exercises, Therapeutic activity, Neuromuscular re-education, Balance training, Gait training, Patient/Family education, Self Care, Joint mobilization, Joint manipulation, Aquatic Therapy, Dry Needling, Electrical stimulation, Spinal manipulation, Spinal mobilization, Cryotherapy, Moist heat, Taping, Traction, Ultrasound, Manual therapy, and Re-evaluation.  PLAN FOR NEXT SESSION:  Aquatic PT  Kerin Perna, PTA 03/03/22 10:06 AM [facility] [place], [place], [address] Phone: [phone]   Fax:  [phone]

## 2022-03-07 ENCOUNTER — Encounter (HOSPITAL_BASED_OUTPATIENT_CLINIC_OR_DEPARTMENT_OTHER): Payer: Self-pay | Admitting: Physical Therapy

## 2022-03-07 ENCOUNTER — Ambulatory Visit (HOSPITAL_BASED_OUTPATIENT_CLINIC_OR_DEPARTMENT_OTHER): Payer: Medicare Other | Attending: Sports Medicine | Admitting: Physical Therapy

## 2022-03-07 DIAGNOSIS — R262 Difficulty in walking, not elsewhere classified: Secondary | ICD-10-CM | POA: Diagnosis present

## 2022-03-07 DIAGNOSIS — M5459 Other low back pain: Secondary | ICD-10-CM | POA: Diagnosis present

## 2022-03-07 DIAGNOSIS — R293 Abnormal posture: Secondary | ICD-10-CM | POA: Insufficient documentation

## 2022-03-07 DIAGNOSIS — R252 Cramp and spasm: Secondary | ICD-10-CM

## 2022-03-07 DIAGNOSIS — M6281 Muscle weakness (generalized): Secondary | ICD-10-CM

## 2022-03-07 NOTE — Therapy (Signed)
OUTPATIENT PHYSICAL THERAPY TREATMENT NOTE    Patient Name: Lacey French MRN: [id number] DOB:February 02, 1966, 57 y.o., female Today's Date: 03/07/2022   PT End of Session - 03/07/22 1038     Visit Number 19    Date for PT Re-Evaluation 04/04/22    Authorization Type Medicare    PT Start Time 1030    PT Stop Time 1101    PT Time Calculation (min) 31 min    Activity Tolerance Patient limited by pain    Behavior During Therapy WFL for tasks assessed/performed                  Past Medical History:  Diagnosis Date   Allergy    Anxiety    Arthritis    Asthma    COPD (chronic obstructive pulmonary disease) ([place])    Depression    Hyperlipidemia    PTSD (post-traumatic stress disorder)    Past Surgical History:  Procedure Laterality Date   BREAST LUMPECTOMY Left    2, both benign   BREAST SURGERY     REDUCTION   COLONOSCOPY N/A 03/15/2021   Procedure: COLONOSCOPY;  Surgeon: Otis Brace, MD;  Location: [facility];  Service: Gastroenterology;  Laterality: N/A;  pt Allergic to Propofol   JOINT REPLACEMENT     L shoulder, L knee   POLYPECTOMY  03/15/2021   Procedure: POLYPECTOMY;  Surgeon: Otis Brace, MD;  Location: [facility];  Service: Gastroenterology;;   REDUCTION MAMMAPLASTY Bilateral    TONSILLECTOMY     Patient Active Problem List   Diagnosis Date Noted   Acute pain of left knee 11/03/2020   Acute left ankle pain 11/03/2020   Left hand pain 11/03/2020   Carpal tunnel syndrome of left wrist 05/16/2017   PTSD (post-traumatic stress disorder) 04/10/2016   Psychosis ([place]) 04/10/2016   Anxiety 04/10/2016   Allergy 04/10/2016   Asthma 04/10/2016   Depression 04/10/2016    PCP: Kathyrn Lass, MD  REFERRING PROVIDER: Inez Catalina, MD   REFERRING DIAG: M54.50 (ICD-10-CM) - Low back pain, unspecified   Rationale for Evaluation and Treatment Rehabilitation  THERAPY DIAG:  Other low back pain  Muscle weakness (generalized)  Difficulty in  walking, not elsewhere classified  Cramp and spasm  Abnormal posture  ONSET DATE: 12/07/2021  SUBJECTIVE:                                                                                                                                                                                           SUBJECTIVE STATEMENT: Patient reports she tried using ball for psoas release; standing against wall didn't work - will try on floor/bed.  She has been working on the seated hip flexor stretch (on workout bench), "its a good hurt".     PERTINENT HISTORY:  Injured in [facility] on 9/11  PAIN:  Are you having pain? Yes: NPRS scale: 5-6/10 Pain location: low back Pain description: aching Aggravating factors: hurts all the time Relieving factors: nothing   PRECAUTIONS: None  WEIGHT BEARING RESTRICTIONS No  FALLS:  Has patient fallen in last 6 months? No  LIVING ENVIRONMENT: Lives with: lives with their family Lives in: House/apartment   OCCUPATION: disabled  PLOF: Independent, Independent with basic ADLs, Independent with household mobility without device, Independent with community mobility without device, Independent with homemaking with ambulation, Independent with gait, and Independent with transfers  PATIENT GOALS She hopes to gain relief for her back and knees.    OBJECTIVE:   DIAGNOSTIC FINDINGS:  None listed  PATIENT SURVEYS:  Eval:  FOTO  46% (projected 54% by visit 12) 01/19/22: 53% 02/07/22: 36%  SCREENING FOR RED FLAGS: Bowel or bladder incontinence: No Spinal tumors: No Cauda equina syndrome: No Compression fracture: No Abdominal aneurysm: No  COGNITION:  Overall cognitive status: Within functional limits for tasks assessed and emotional distress due to 9/11 events      SENSATION: [facility]  MUSCLE LENGTH: Hamstrings: Right 60 deg; Left 50 deg Thomas test: Right pos ; Left pos   01/19/22: patient too heavily guarded  02/07/22: patient still heavily  guarded  POSTURE: rounded shoulders and decreased lumbar lordosis  PALPATION: Crepitus bilateral knees  LUMBAR ROM:   Active  A/PROM  eval A/PROM 01/19/22 A/PROM 02/07/22  Flexion Fingertips to mid shin Fingertips to mid thigh Fingertips to mid patella  Extension WFL Pain with minimal ext [facility] but with pain  Right lateral flexion Fingertips to just above joint line Fingertips to mid thigh Fingertips to distal thigh  Left lateral flexion Fingertips to just above joint line Fingertips to just above mid thigh Fingertips to distal thigh  Right rotation 50% 25% 25%  Left rotation 50% 25% 25%   (Blank rows = not tested)  LOWER EXTREMITY ROM:     WFL with exception of bilateral knee flexion limited to 100 to 120 degrees of flexion comfortably  LOWER EXTREMITY MMT:    Generally 4 to 4-/5 t/o bilateral LE's 01/19/22: Patient too guarded/ inconclusive 02/07/22: Inconclusive  LUMBAR SPECIAL TESTS:  Straight leg raise test: Negative  FUNCTIONAL TESTS:  Eval: 5 times sit to stand: time contraints - not completed Timed up and go (TUG): not completed  12/16/2021:  unable to complete secondary to increased pain.  01/19/22: 5 times sit to stand: time contraints : 54.74 sec Timed up and go (TUG): 35.76 sec  02/07/22: 5 times sit to stand: time contraints : 44.60 sec Timed up and go (TUG): 25.68 sec  GAIT: Distance walked: 50 Assistive device utilized: None Level of assistance: Complete Independence Comments: antalgic    TODAY'S TREATMENT: 03/07/22: Pt seen for aquatic therapy today.  Treatment took place in water 3.25-4.5 ft in depth at the [facility]. Temp of water was 92.  Pt entered/exited the pool independently via steps with hand rail. In 4 ft 8" water: - suspended with arms over yellow noodle for spine decompression - backward walking (2 laps) and side stepping (1/2 width) with UE supported by yellow noodle - holding wall: wide stance and light weight  shifts side to side with knee flexion (not tolerated); hamstring curls; partial heel raises; attempt at toe raises; partial  hip hinge to neutral  -straddling yellow noodle:  cycling with breast stroke arms - back against wall and solid noodle under thigh at knee (passive hip flexion stretch into hip ext to neutral x 5-6 each) - bilat calf stretch at stairs   03/03/22: Pt seen for aquatic therapy today.  Treatment took place in water 3.25-4.5 ft in depth at the [facility]. Temp of water was 92.  Pt entered/exited the pool independently via steps with hand rail. In 4 ft 8" water: - arms in T with attempt at micro truck rotation R/L with cervical rotation - backward walking without/with short reciprocal arm swing - side stepping with short range shoulder abdct/addct R/L (L more difficult than R) - with UE supported on yellow noodle (in front): toe taps crossing midline, toe taps into abdct, toe taps into hip ext x 10 each -holding wall:  unilateral hip flexion with LE motion like bicycle revolution x 10 - holding short blue noodle,  tricep push downs x10, then straight arm pull downs for TrA - forward walk with row motion holding short blue noodle  (only able to take 2-3 steps due to spasms)  02/28/22: Pt seen for aquatic therapy today.  Treatment took place in water 3.25-4.5 ft in depth at the [facility]. Temp of water was 92.  Pt entered/exited the pool independently via steps with hand rail.   In 4 ft 8" water: - with yellow solid noodle under arms:  backward/ forward walking (slow, and to tolerance),  side stepping - standard stance with 1/2 submerged kickboard, partial push/pull - standard stance with fins on wrists: shoulder addct/abdct, horiz abdct/add, reciprocal arm swing - reciprocal arm swing with backward gait - supported with yellow solid noodle:  front/back toe taps x 5 each, side to side toe taps -return to suspended position with arms on solid  noodle, then walking backwards; 2 sets 5 of 1/2 diamond LE - TrA set with short blue noodle pull down x 10 - on land, trial of seated Rt hip flexor stretch (at end of bench with UE support on bench and Rt knee flexed) - very limited tolerance and increased time to reach position.    Pt requires the buoyancy and hydrostatic pressure of water for support, and to offload joints by unweighting joint load by at least 50 % in navel deep water and by at least 75-80% in chest to neck deep water.  Viscosity of the water is needed for resistance of strengthening. Water current perturbations provides challenge to standing balance requiring increased core activation.  02/08/2022: Nustep level 4 x5 min with PT present to discuss status Supine posterior pelvic tilt 2x10 Supine posterior pelvic tilt with march 2x10 Hamstring stretch (supine) 3 x 30 sec each LE (verbal cues to find more of an isolated hamstring stretch by flexing the knee) IT band stretch (supine) 3 x 30 sec each LE Sidelying open book x10 bilat Manual Therapy:  Grade 2/3 joint mobilizations along thoracic and lumbar spine.  Soft tissue mobilization along lumbar paraspinals.   02/07/22: Nustep x 5 min level 4 PPT x 20 Trunk rotation in hook lying x 10 each side PPT with march x 20 Hamstring stretch (supine) 3 x 30 sec each LE (verbal cues to find more of an isolated hamstring stretch by flexing the knee) IT band stretch (supine) 3 x 30 sec each LE Reassessment for recert: see above  43/15/40: PPT x 20 Seated piriformis stretch 3 x 10 sec (using  strap, patient unable to get ankle on top of knee) Hamstring stretch (supine) 3 x 30 sec each LE (verbal cues to find more of an isolated hamstring stretch by flexing the knee) IT band stretch (supine) 3 x 30 sec each LE Trunk rotation x 10 each side Open book stretch x 10 each side PPT with march x 20 Ice pack to lumbar spine x 10 min in hook lying     PATIENT EDUCATION:  Education  details: Aquatics progressions/modifications  Person educated: Patient Education method: Consulting civil engineer, Demonstration, Corporate treasurer cues, Verbal cues, and Handouts Education comprehension: verbalized understanding, returned demonstration, verbal cues required, and needs further education   HOME EXERCISE PROGRAM: Access Code: [email or web address] URL: [email or web address] Date: 01/19/2022 Prepared by: Candyce Churn  Exercises - Standing Hamstring Stretch on Chair  - 1 x daily - 7 x weekly - 1 sets - 3 reps - 30 hold - Supine Hamstring Stretch with Strap  - 1 x daily - 7 x weekly - 1 sets - 3 reps - 30 hold - Supine ITB Stretch with Strap  - 1 x daily - 7 x weekly - 1 sets - 3 reps - 30  hold - Supine Figure 4 Piriformis Stretch  - 1 x daily - 7 x weekly - 1 sets - 3 reps - 30 hold - Supine Posterior Pelvic Tilt  - 1 x daily - 7 x weekly - 3 sets - 10 reps - Supine Piriformis Stretch with Towel  - 1 x daily - 7 x weekly - 1 sets - 2 reps - 20 sec hold - Standing 'L' Stretch at Counter  - 1 x daily - 7 x weekly - 1 sets - 2 reps - 20 sec hold - Supine 90/90 Alternating Heel Touches with Posterior Pelvic Tilt  - 1 x daily - 7 x weekly - 1 sets - 20 repsAccess Code: [email or web address] URL: [email or web address] Date: 12/29/2021 Prepared by: Shelby Dubin Menke   ASSESSMENT:  CLINICAL IMPRESSION: Continued limited tolerance for exercises, yet pt remains motivated and participates in water to her best capacity.  ROM in spine and in LEs is limited. Intermittent spasming in low back occurs throughout session.  Will continue to progress as tolerated.    OBJECTIVE IMPAIRMENTS Abnormal gait, decreased strength, increased muscle spasms, impaired flexibility, postural dysfunction, and pain.   ACTIVITY LIMITATIONS carrying, lifting, bending, sitting, standing, squatting, sleeping, stairs, transfers, bed mobility, and dressing  PARTICIPATION LIMITATIONS: meal prep, cleaning, laundry, interpersonal  relationship, driving, shopping, community activity, occupation, and yard work  PERSONAL FACTORS Fitness, Past/current experiences, Time since onset of injury/illness/exacerbation, and 1 comorbidity: PTSD, depression, anxiety  are also affecting patient's functional outcome.   REHAB POTENTIAL: Fair due to multiple injuries and deformities post surgery  CLINICAL DECISION MAKING: Evolving/moderate complexity  EVALUATION COMPLEXITY: Moderate   GOALS: Goals reviewed with patient? Yes  SHORT TERM GOALS: Target date: 01/11/2022  Pain report to be no greater than 5/10. Baseline: Goal status: MET  2.  Patient will be independent with initial HEP  Baseline:  Goal status: MET  LONG TERM GOALS: Target date: 04/04/2022  Patient to be independent with advanced HEP  Baseline:  Goal status: IN PROGRESS  2.  Patient to report pain no increase in pain while performing desired community tasks. Baseline:  Goal status: IN PROGRESS  3.  FOTO to improve to 54% to demonstrate improvements in functional tasks. Baseline: 46% Goal status: IN PROGRESS  4.  Lumbar ROM to improve to [facility] on all planes of motion  Baseline:  Goal status: IN PROGRESS  5.  Patient to be able to start a walking program and increase by 1/4 mile per session.   Baseline:  Goal status: IN PROGRESS     PLAN: PT FREQUENCY: 2x/week  PT DURATION: 8 weeks  PLANNED INTERVENTIONS: Therapeutic exercises, Therapeutic activity, Neuromuscular re-education, Balance training, Gait training, Patient/Family education, Self Care, Joint mobilization, Joint manipulation, Aquatic Therapy, Dry Needling, Electrical stimulation, Spinal manipulation, Spinal mobilization, Cryotherapy, Moist heat, Taping, Traction, Ultrasound, Manual therapy, and Re-evaluation.  PLAN FOR NEXT SESSION:  Aquatic PT   Kerin Perna, PTA 03/07/22 1:22 PM [facility] [address] [place], [place],  [address] Phone: [phone]   Fax:  [phone]

## 2022-03-09 ENCOUNTER — Ambulatory Visit: Payer: Medicare Other | Admitting: Rehabilitative and Restorative Service Providers"

## 2022-03-09 ENCOUNTER — Other Ambulatory Visit: Payer: Self-pay | Admitting: Physical Medicine and Rehabilitation

## 2022-03-09 DIAGNOSIS — M545 Low back pain, unspecified: Secondary | ICD-10-CM

## 2022-03-10 ENCOUNTER — Encounter (HOSPITAL_BASED_OUTPATIENT_CLINIC_OR_DEPARTMENT_OTHER): Payer: Self-pay | Admitting: Physical Therapy

## 2022-03-10 ENCOUNTER — Ambulatory Visit (HOSPITAL_BASED_OUTPATIENT_CLINIC_OR_DEPARTMENT_OTHER): Payer: Medicare Other | Admitting: Physical Therapy

## 2022-03-10 DIAGNOSIS — R252 Cramp and spasm: Secondary | ICD-10-CM

## 2022-03-10 DIAGNOSIS — M5459 Other low back pain: Secondary | ICD-10-CM | POA: Diagnosis not present

## 2022-03-10 DIAGNOSIS — R262 Difficulty in walking, not elsewhere classified: Secondary | ICD-10-CM

## 2022-03-10 DIAGNOSIS — R293 Abnormal posture: Secondary | ICD-10-CM

## 2022-03-10 DIAGNOSIS — M6281 Muscle weakness (generalized): Secondary | ICD-10-CM

## 2022-03-10 NOTE — Therapy (Signed)
OUTPATIENT PHYSICAL THERAPY TREATMENT NOTE    Patient Name: Lacey French MRN: [id number] DOB:1966-01-24, 57 y.o., female Today's Date: 03/10/2022   PT End of Session - 03/10/22 1034     Visit Number 20    Date for PT Re-Evaluation 04/04/22    Authorization Type Medicare    Progress Note Due on Visit 25    PT Start Time 1030    PT Stop Time 1112    PT Time Calculation (min) 42 min    Activity Tolerance Patient tolerated treatment well    Behavior During Therapy WFL for tasks assessed/performed                  Past Medical History:  Diagnosis Date   Allergy    Anxiety    Arthritis    Asthma    COPD (chronic obstructive pulmonary disease) ([place])    Depression    Hyperlipidemia    PTSD (post-traumatic stress disorder)    Past Surgical History:  Procedure Laterality Date   BREAST LUMPECTOMY Left    2, both benign   BREAST SURGERY     REDUCTION   COLONOSCOPY N/A 03/15/2021   Procedure: COLONOSCOPY;  Surgeon: Otis Brace, MD;  Location: [facility];  Service: Gastroenterology;  Laterality: N/A;  pt Allergic to Propofol   JOINT REPLACEMENT     L shoulder, L knee   POLYPECTOMY  03/15/2021   Procedure: POLYPECTOMY;  Surgeon: Otis Brace, MD;  Location: [facility];  Service: Gastroenterology;;   REDUCTION MAMMAPLASTY Bilateral    TONSILLECTOMY     Patient Active Problem List   Diagnosis Date Noted   Acute pain of left knee 11/03/2020   Acute left ankle pain 11/03/2020   Left hand pain 11/03/2020   Carpal tunnel syndrome of left wrist 05/16/2017   PTSD (post-traumatic stress disorder) 04/10/2016   Psychosis ([place]) 04/10/2016   Anxiety 04/10/2016   Allergy 04/10/2016   Asthma 04/10/2016   Depression 04/10/2016    PCP: Kathyrn Lass, MD  REFERRING PROVIDER: Inez Catalina, MD   REFERRING DIAG: M54.50 (ICD-10-CM) - Low back pain, unspecified   Rationale for Evaluation and Treatment Rehabilitation  THERAPY DIAG:  Other low back  pain  Muscle weakness (generalized)  Difficulty in walking, not elsewhere classified  Cramp and spasm  Abnormal posture  ONSET DATE: 12/07/2021  SUBJECTIVE:                                                                                                                                                                                           SUBJECTIVE STATEMENT: Patient reports she tried using ball for psoas release; hard  to relax/ felt a lot of pressure. She has been stretching for about 30-40 min every other day.  She'd like to be more mobile with less pain, and is open to trying anything.   PERTINENT HISTORY:  Injured in [place] [facility] on 9/11  PAIN:  Are you having pain? Yes: NPRS scale: 5/10 Pain location: low back Pain description: aching Aggravating factors: hurts all the time Relieving factors: nothing   PRECAUTIONS: None  WEIGHT BEARING RESTRICTIONS No  FALLS:  Has patient fallen in last 6 months? No  LIVING ENVIRONMENT: Lives with: lives with their family Lives in: House/apartment   OCCUPATION: disabled  PLOF: Independent, Independent with basic ADLs, Independent with household mobility without device, Independent with community mobility without device, Independent with homemaking with ambulation, Independent with gait, and Independent with transfers  PATIENT GOALS She hopes to gain relief for her back and knees.    OBJECTIVE:   DIAGNOSTIC FINDINGS:  None listed  PATIENT SURVEYS:  Eval:  FOTO  46% (projected 54% by visit 12) 01/19/22: 53% 02/07/22: 36%  SCREENING FOR RED FLAGS: Bowel or bladder incontinence: No Spinal tumors: No Cauda equina syndrome: No Compression fracture: No Abdominal aneurysm: No  COGNITION:  Overall cognitive status: Within functional limits for tasks assessed and emotional distress due to 9/11 events      SENSATION: [facility]  MUSCLE LENGTH: Hamstrings: Right 60 deg; Left 50 deg Thomas test: Right pos ; Left pos    01/19/22: patient too heavily guarded  02/07/22: patient still heavily guarded  POSTURE: rounded shoulders and decreased lumbar lordosis  PALPATION: Crepitus bilateral knees  LUMBAR ROM:   Active  A/PROM  eval A/PROM 01/19/22 A/PROM 02/07/22  Flexion Fingertips to mid shin Fingertips to mid thigh Fingertips to mid patella  Extension WFL Pain with minimal ext [facility] but with pain  Right lateral flexion Fingertips to just above joint line Fingertips to mid thigh Fingertips to distal thigh  Left lateral flexion Fingertips to just above joint line Fingertips to just above mid thigh Fingertips to distal thigh  Right rotation 50% 25% 25%  Left rotation 50% 25% 25%   (Blank rows = not tested)  LOWER EXTREMITY ROM:     WFL with exception of bilateral knee flexion limited to 100 to 120 degrees of flexion comfortably  LOWER EXTREMITY MMT:    Generally 4 to 4-/5 t/o bilateral LE's 01/19/22: Patient too guarded/ inconclusive 02/07/22: Inconclusive  LUMBAR SPECIAL TESTS:  Straight leg raise test: Negative  FUNCTIONAL TESTS:  Eval: 5 times sit to stand: time contraints - not completed Timed up and go (TUG): not completed  12/16/2021:  unable to complete secondary to increased pain.  01/19/22: 5 times sit to stand: time contraints : 54.74 sec Timed up and go (TUG): 35.76 sec  02/07/22: 5 times sit to stand: time contraints : 44.60 sec Timed up and go (TUG): 25.68 sec  GAIT: Distance walked: 50 Assistive device utilized: None Level of assistance: Complete Independence Comments: antalgic    TODAY'S TREATMENT: 03/10/22: Pt seen for aquatic therapy today.  Treatment took place in water 3.25-4.5 ft in depth at the [place]. Temp of water was 92.  Pt entered/exited the pool independently via steps with hand rail.  In 4 ft 8" water: - suspended with arms over yellow noodle for spine decompression - backward walking (2 laps) with UE supported by yellow  noodle, cues for heel strike and straight knee with LLE (stays on toes)  - holding wall:  Lt toe taps forward, Rt toe taps to back, Rt toe taps to side x 5 each ( hip abdct not tolerated)  - back against wall and solid noodle under thigh at knee (passive hip flexion stretch into hip ext to neutral); with RLE, knee flex/ext with noodle under knee (nerve glide, head forward -1 rep)  - holding corner and LE suspended:  bilat clam x 8, cycling (slow) - at bench in water:  plank for neutral spine, then slow single knee towards chest for ROM x 2, x 2 sets - unilateral  calf stretch at stairs   03/07/22: Pt seen for aquatic therapy today.  Treatment took place in water 3.25-4.5 ft in depth at the [facility]. Temp of water was 92.  Pt entered/exited the pool independently via steps with hand rail. In 4 ft 8" water: - suspended with arms over yellow noodle for spine decompression - backward walking (2 laps) and side stepping (1/2 width) with UE supported by yellow noodle - holding wall: wide stance and light weight shifts side to side with knee flexion (not tolerated); hamstring curls; partial heel raises; attempt at toe raises; partial hip hinge to neutral  -straddling yellow noodle:  cycling with breast stroke arms - back against wall and solid noodle under thigh at knee (passive hip flexion stretch into hip ext to neutral x 5-6 each) - bilat calf stretch at stairs   03/03/22: Pt seen for aquatic therapy today.  Treatment took place in water 3.25-4.5 ft in depth at the [place]. Temp of water was 92.  Pt entered/exited the pool independently via steps with hand rail. In 4 ft 8" water: - arms in T with attempt at micro truck rotation R/L with cervical rotation - backward walking without/with short reciprocal arm swing - side stepping with short range shoulder abdct/addct R/L (L more difficult than R) - with UE supported on yellow noodle (in front): toe taps crossing  midline, toe taps into abdct, toe taps into hip ext x 10 each -holding wall:  unilateral hip flexion with LE motion like bicycle revolution x 10 - holding short blue noodle,  tricep push downs x10, then straight arm pull downs for TrA - forward walk with row motion holding short blue noodle  (only able to take 2-3 steps due to spasms)  02/28/22: Pt seen for aquatic therapy today.  Treatment took place in water 3.25-4.5 ft in depth at the [place]. Temp of water was 92.  Pt entered/exited the pool independently via steps with hand rail.   In 4 ft 8" water: - with yellow solid noodle under arms:  backward/ forward walking (slow, and to tolerance),  side stepping - standard stance with 1/2 submerged kickboard, partial push/pull - standard stance with fins on wrists: shoulder addct/abdct, horiz abdct/add, reciprocal arm swing - reciprocal arm swing with backward gait - supported with yellow solid noodle:  front/back toe taps x 5 each, side to side toe taps -return to suspended position with arms on solid noodle, then walking backwards; 2 sets 5 of 1/2 diamond LE - TrA set with short blue noodle pull down x 10 - on land, trial of seated Rt hip flexor stretch (at end of bench with UE support on bench and Rt knee flexed) - very limited tolerance and increased time to reach position.    Pt requires the buoyancy and hydrostatic pressure of water for support, and to offload joints by unweighting joint load by at least  50 % in navel deep water and by at least 75-80% in chest to neck deep water.  Viscosity of the water is needed for resistance of strengthening. Water current perturbations provides challenge to standing balance requiring increased core activation.  02/08/2022: Nustep level 4 x5 min with PT present to discuss status Supine posterior pelvic tilt 2x10 Supine posterior pelvic tilt with march 2x10 Hamstring stretch (supine) 3 x 30 sec each LE (verbal cues to find more of an  isolated hamstring stretch by flexing the knee) IT band stretch (supine) 3 x 30 sec each LE Sidelying open book x10 bilat Manual Therapy:  Grade 2/3 joint mobilizations along thoracic and lumbar spine.  Soft tissue mobilization along lumbar paraspinals.   02/07/22: Nustep x 5 min level 4 PPT x 20 Trunk rotation in hook lying x 10 each side PPT with march x 20 Hamstring stretch (supine) 3 x 30 sec each LE (verbal cues to find more of an isolated hamstring stretch by flexing the knee) IT band stretch (supine) 3 x 30 sec each LE Reassessment for recert: see above  12/75/17: PPT x 20 Seated piriformis stretch 3 x 10 sec (using strap, patient unable to get ankle on top of knee) Hamstring stretch (supine) 3 x 30 sec each LE (verbal cues to find more of an isolated hamstring stretch by flexing the knee) IT band stretch (supine) 3 x 30 sec each LE Trunk rotation x 10 each side Open book stretch x 10 each side PPT with march x 20 Ice pack to lumbar spine x 10 min in hook lying     PATIENT EDUCATION:  Education details: Aquatics progressions/modifications  Person educated: Patient Education method: Consulting civil engineer, Media planner, Corporate treasurer cues, Verbal cues, and Handouts Education comprehension: verbalized understanding, returned demonstration, verbal cues required, and needs further education   HOME EXERCISE PROGRAM: Access Code: [email or web address] URL: [email or web address] Date: 01/19/2022 Prepared by: Candyce Churn  Exercises - Standing Hamstring Stretch on Chair  - 1 x daily - 7 x weekly - 1 sets - 3 reps - 30 hold - Supine Hamstring Stretch with Strap  - 1 x daily - 7 x weekly - 1 sets - 3 reps - 30 hold - Supine ITB Stretch with Strap  - 1 x daily - 7 x weekly - 1 sets - 3 reps - 30  hold - Supine Figure 4 Piriformis Stretch  - 1 x daily - 7 x weekly - 1 sets - 3 reps - 30 hold - Supine Posterior Pelvic Tilt  - 1 x daily - 7 x weekly - 3 sets - 10 reps - Supine Piriformis  Stretch with Towel  - 1 x daily - 7 x weekly - 1 sets - 2 reps - 20 sec hold - Standing 'L' Stretch at Counter  - 1 x daily - 7 x weekly - 1 sets - 2 reps - 20 sec hold - Supine 90/90 Alternating Heel Touches with Posterior Pelvic Tilt  - 1 x daily - 7 x weekly - 1 sets - 20 repsAccess Code: [email or web address] URL: [email or web address] Date: 12/29/2021 Prepared by: Shelby Dubin Menke   ASSESSMENT:  CLINICAL IMPRESSION: Continued limited tolerance for exercises, yet pt remains motivated and participates in water to her best capacity.  ROM in spine and in LEs is limited. Intermittent spasming and increases in low back pain occurs throughout session.  Will continue to progress as tolerated.    OBJECTIVE IMPAIRMENTS Abnormal gait, decreased strength, increased muscle spasms, impaired flexibility, postural dysfunction, and pain.  ACTIVITY LIMITATIONS carrying, lifting, bending, sitting, standing, squatting, sleeping, stairs, transfers, bed mobility, and dressing  PARTICIPATION LIMITATIONS: meal prep, cleaning, laundry, interpersonal relationship, driving, shopping, community activity, occupation, and yard work  PERSONAL FACTORS Fitness, Past/current experiences, Time since onset of injury/illness/exacerbation, and 1 comorbidity: PTSD, depression, anxiety  are also affecting patient's functional outcome.   REHAB POTENTIAL: Fair due to multiple injuries and deformities post surgery  CLINICAL DECISION MAKING: Evolving/moderate complexity  EVALUATION COMPLEXITY: Moderate   GOALS: Goals reviewed with patient? Yes  SHORT TERM GOALS: Target date: 01/11/2022  Pain report to be no greater than 5/10. Baseline: Goal status: MET  2.  Patient will be independent with initial HEP  Baseline:  Goal status: MET  LONG TERM GOALS: Target date: 04/04/2022  Patient to be independent with advanced HEP  Baseline:  Goal status: IN PROGRESS  2.  Patient to report pain no increase in pain while  performing desired community tasks. Baseline:  Goal status: IN PROGRESS  3.  FOTO to improve to 54% to demonstrate improvements in functional tasks. Baseline: 46% Goal status: IN PROGRESS  4.  Lumbar ROM to improve to [facility] on all planes of motion Baseline:  Goal status: IN PROGRESS  5.  Patient to be able to start a walking program and increase by 1/4 mile per session.   Baseline:  Goal status: IN PROGRESS     PLAN: PT FREQUENCY: 2x/week  PT DURATION: 8 weeks  PLANNED INTERVENTIONS: Therapeutic exercises, Therapeutic activity, Neuromuscular re-education, Balance training, Gait training, Patient/Family education, Self Care, Joint mobilization, Joint manipulation, Aquatic Therapy, Dry Needling, Electrical stimulation, Spinal manipulation, Spinal mobilization, Cryotherapy, Moist heat, Taping, Traction, Ultrasound, Manual therapy, and Re-evaluation.  PLAN FOR NEXT SESSION:  Aquatic PT   Kerin Perna, PTA 03/10/22 12:51 PM [facility] [address] [place], [place], [address] Phone: [phone]   Fax:  [phone]

## 2022-03-13 ENCOUNTER — Encounter (HOSPITAL_BASED_OUTPATIENT_CLINIC_OR_DEPARTMENT_OTHER): Payer: Self-pay | Admitting: Physical Therapy

## 2022-03-13 ENCOUNTER — Ambulatory Visit (HOSPITAL_BASED_OUTPATIENT_CLINIC_OR_DEPARTMENT_OTHER): Payer: Medicare Other | Admitting: Physical Therapy

## 2022-03-13 DIAGNOSIS — M5459 Other low back pain: Secondary | ICD-10-CM | POA: Diagnosis not present

## 2022-03-13 DIAGNOSIS — M6281 Muscle weakness (generalized): Secondary | ICD-10-CM

## 2022-03-13 DIAGNOSIS — R262 Difficulty in walking, not elsewhere classified: Secondary | ICD-10-CM

## 2022-03-13 NOTE — Therapy (Signed)
OUTPATIENT PHYSICAL THERAPY TREATMENT NOTE    Patient Name: Lacey French MRN: [id number] DOB:[birth year], 57 y.o., female Today's Date: 03/13/2022   PT End of Session - 03/13/22 0945     Visit Number 21    Date for PT Re-Evaluation 04/04/22    Authorization Type Medicare    Progress Note Due on Visit 25    PT Start Time 0934    PT Stop Time 1016    PT Time Calculation (min) 42 min                  Past Medical History:  Diagnosis Date   Allergy    Anxiety    Arthritis    Asthma    COPD (chronic obstructive pulmonary disease) ([place])    Depression    Hyperlipidemia    PTSD (post-traumatic stress disorder)    Past Surgical History:  Procedure Laterality Date   BREAST LUMPECTOMY Left    2, both benign   BREAST SURGERY     REDUCTION   COLONOSCOPY N/A 03/15/2021   Procedure: COLONOSCOPY;  Surgeon: Otis Brace, MD;  Location: [facility];  Service: Gastroenterology;  Laterality: N/A;  pt Allergic to Propofol   JOINT REPLACEMENT     L shoulder, L knee   POLYPECTOMY  03/15/2021   Procedure: POLYPECTOMY;  Surgeon: Otis Brace, MD;  Location: [facility];  Service: Gastroenterology;;   REDUCTION MAMMAPLASTY Bilateral    TONSILLECTOMY     Patient Active Problem List   Diagnosis Date Noted   Acute pain of left knee 11/03/2020   Acute left ankle pain 11/03/2020   Left hand pain 11/03/2020   Carpal tunnel syndrome of left wrist 05/16/2017   PTSD (post-traumatic stress disorder) 04/10/2016   Psychosis ([place]) 04/10/2016   Anxiety 04/10/2016   Allergy 04/10/2016   Asthma 04/10/2016   Depression 04/10/2016    PCP: Kathyrn Lass, MD  REFERRING PROVIDER: Inez Catalina, MD   REFERRING DIAG: M54.50 (ICD-10-CM) - Low back pain, unspecified   Rationale for Evaluation and Treatment Rehabilitation  THERAPY DIAG:  Other low back pain  Muscle weakness (generalized)  Difficulty in walking, not elsewhere classified  ONSET DATE: 12/07/2021  SUBJECTIVE:                                                                                                                                                                                            SUBJECTIVE STATEMENT: Patient reports she spent a lot of time stretching and using foam roller/ massage balls this weekend.     PERTINENT HISTORY:  Injured in [facility] on 9/11  PAIN:  Are  you having pain? Yes: NPRS scale: 4/10 Pain location: low back Pain description: aching Aggravating factors: hurts all the time Relieving factors: nothing   PRECAUTIONS: None  WEIGHT BEARING RESTRICTIONS No  FALLS:  Has patient fallen in last 6 months? No  LIVING ENVIRONMENT: Lives with: lives with their family Lives in: House/apartment   OCCUPATION: disabled  PLOF: Independent, Independent with basic ADLs, Independent with household mobility without device, Independent with community mobility without device, Independent with homemaking with ambulation, Independent with gait, and Independent with transfers  PATIENT GOALS She hopes to gain relief for her back and knees.    OBJECTIVE:   DIAGNOSTIC FINDINGS:  None listed  PATIENT SURVEYS:  Eval:  FOTO  46% (projected 54% by visit 12) 01/19/22: 53% 02/07/22: 36%  SCREENING FOR RED FLAGS: Bowel or bladder incontinence: No Spinal tumors: No Cauda equina syndrome: No Compression fracture: No Abdominal aneurysm: No  COGNITION:  Overall cognitive status: Within functional limits for tasks assessed and emotional distress due to 9/11 events      SENSATION: [facility]  MUSCLE LENGTH: Hamstrings: Right 60 deg; Left 50 deg Thomas test: Right pos ; Left pos   01/19/22: patient too heavily guarded  02/07/22: patient still heavily guarded  POSTURE: rounded shoulders and decreased lumbar lordosis  PALPATION: Crepitus bilateral knees  LUMBAR ROM:   Active  A/PROM  eval A/PROM 01/19/22 A/PROM 02/07/22  Flexion Fingertips to mid shin  Fingertips to mid thigh Fingertips to mid patella  Extension WFL Pain with minimal ext [facility] but with pain  Right lateral flexion Fingertips to just above joint line Fingertips to mid thigh Fingertips to distal thigh  Left lateral flexion Fingertips to just above joint line Fingertips to just above mid thigh Fingertips to distal thigh  Right rotation 50% 25% 25%  Left rotation 50% 25% 25%   (Blank rows = not tested)  LOWER EXTREMITY ROM:     WFL with exception of bilateral knee flexion limited to 100 to 120 degrees of flexion comfortably  LOWER EXTREMITY MMT:    Generally 4 to 4-/5 t/o bilateral LE's 01/19/22: Patient too guarded/ inconclusive 02/07/22: Inconclusive  LUMBAR SPECIAL TESTS:  Straight leg raise test: Negative  FUNCTIONAL TESTS:  Eval: 5 times sit to stand: time contraints - not completed Timed up and go (TUG): not completed  12/16/2021:  unable to complete secondary to increased pain.  01/19/22: 5 times sit to stand: time contraints : 54.74 sec Timed up and go (TUG): 35.76 sec  02/07/22: 5 times sit to stand: time contraints : 44.60 sec Timed up and go (TUG): 25.68 sec  GAIT: Distance walked: 50 Assistive device utilized: None Level of assistance: Complete Independence Comments: antalgic    TODAY'S TREATMENT: 03/13/22: Pt seen for aquatic therapy today.  Treatment took place in water 3.25-4.5 ft in depth at the [place]. Temp of water was 92.  Pt entered/exited the pool independently via steps with hand rail.  In 4 ft 8" water: - suspended with arms over yellow noodle for spine decompression; L / R hip flexor stretch  - backward walking (1.5 laps) with UE supported by yellow noodle, cues for heel strike and straight knee with LLE (stays on toes); side stepping R/L - holding wall: 3 way toe taps x 5 each ( hip abdct and ext not tolerated on LLE); heel raises x 10  - back against wall and solid noodle under R thigh at knee, blue noodle  under L thigh (passive hip flexion stretch  into hip ext to neutral) x 10 each; blue noodle (with hole) at ankle for R hamstring stretch x 30s - at bench in water:  trial of plank for neutral spine with yellow noodle at hips (not tolerated), with blue noodle at hips (not tolerated) ,Opp arm/leg lift x 5 each (holding 5 sec each)then slow single knee towards chest for ROM x 2, x 2 sets; Standing bilat overhead stretch -   - return to deeper water for decompression: gentle twist with shoulder flexion (to shoulder height) crossing midline x 5 each side; then scap depression with gentle trunk side bend x 8 each side (lateral neck flexion not tolerated).    03/10/22: Pt seen for aquatic therapy today.  Treatment took place in water 3.25-4.5 ft in depth at the [place]. Temp of water was 92.  Pt entered/exited the pool independently via steps with hand rail.  In 4 ft 8" water: - suspended with arms over yellow noodle for spine decompression - backward walking (2 laps) with UE supported by yellow noodle, cues for heel strike and straight knee with LLE (stays on toes)  - holding wall: Lt toe taps forward, Rt toe taps to back, Rt toe taps to side x 5 each ( hip abdct not tolerated)  - back against wall and solid noodle under thigh at knee (passive hip flexion stretch into hip ext to neutral); with RLE, knee flex/ext with noodle under knee (nerve glide, head forward -1 rep)  - holding corner and LE suspended:  bilat clam x 8, cycling (slow) - at bench in water:  plank for neutral spine, then slow single knee towards chest for ROM x 2, x 2 sets - unilateral  calf stretch at stairs   03/07/22: Pt seen for aquatic therapy today.  Treatment took place in water 3.25-4.5 ft in depth at the [place]. Temp of water was 92.  Pt entered/exited the pool independently via steps with hand rail. In 4 ft 8" water: - suspended with arms over yellow noodle for spine decompression - backward  walking (2 laps) and side stepping (1/2 width) with UE supported by yellow noodle - holding wall: wide stance and light weight shifts side to side with knee flexion (not tolerated); hamstring curls; partial heel raises; attempt at toe raises; partial hip hinge to neutral  -straddling yellow noodle:  cycling with breast stroke arms - back against wall and solid noodle under thigh at knee (passive hip flexion stretch into hip ext to neutral x 5-6 each) - bilat calf stretch at stairs   03/03/22: Pt seen for aquatic therapy today.  Treatment took place in water 3.25-4.5 ft in depth at the [place]. Temp of water was 92.  Pt entered/exited the pool independently via steps with hand rail. In 4 ft 8" water: - arms in T with attempt at micro truck rotation R/L with cervical rotation - backward walking without/with short reciprocal arm swing - side stepping with short range shoulder abdct/addct R/L (L more difficult than R) - with UE supported on yellow noodle (in front): toe taps crossing midline, toe taps into abdct, toe taps into hip ext x 10 each -holding wall:  unilateral hip flexion with LE motion like bicycle revolution x 10 - holding short blue noodle,  tricep push downs x10, then straight arm pull downs for TrA - forward walk with row motion holding short blue noodle  (only able to take 2-3 steps due to spasms)  02/28/22: Pt  seen for aquatic therapy today.  Treatment took place in water 3.25-4.5 ft in depth at the [place]. Temp of water was 92.  Pt entered/exited the pool independently via steps with hand rail.   In 4 ft 8" water: - with yellow solid noodle under arms:  backward/ forward walking (slow, and to tolerance),  side stepping - standard stance with 1/2 submerged kickboard, partial push/pull - standard stance with fins on wrists: shoulder addct/abdct, horiz abdct/add, reciprocal arm swing - reciprocal arm swing with backward gait - supported with  yellow solid noodle:  front/back toe taps x 5 each, side to side toe taps -return to suspended position with arms on solid noodle, then walking backwards; 2 sets 5 of 1/2 diamond LE - TrA set with short blue noodle pull down x 10 - on land, trial of seated Rt hip flexor stretch (at end of bench with UE support on bench and Rt knee flexed) - very limited tolerance and increased time to reach position.    Pt requires the buoyancy and hydrostatic pressure of water for support, and to offload joints by unweighting joint load by at least 50 % in navel deep water and by at least 75-80% in chest to neck deep water.  Viscosity of the water is needed for resistance of strengthening. Water current perturbations provides challenge to standing balance requiring increased core activation.  02/08/2022: Nustep level 4 x5 min with PT present to discuss status Supine posterior pelvic tilt 2x10 Supine posterior pelvic tilt with march 2x10 Hamstring stretch (supine) 3 x 30 sec each LE (verbal cues to find more of an isolated hamstring stretch by flexing the knee) IT band stretch (supine) 3 x 30 sec each LE Sidelying open book x10 bilat Manual Therapy:  Grade 2/3 joint mobilizations along thoracic and lumbar spine.  Soft tissue mobilization along lumbar paraspinals.   02/07/22: Nustep x 5 min level 4 PPT x 20 Trunk rotation in hook lying x 10 each side PPT with march x 20 Hamstring stretch (supine) 3 x 30 sec each LE (verbal cues to find more of an isolated hamstring stretch by flexing the knee) IT band stretch (supine) 3 x 30 sec each LE Reassessment for recert: see above  94/07/68: PPT x 20 Seated piriformis stretch 3 x 10 sec (using strap, patient unable to get ankle on top of knee) Hamstring stretch (supine) 3 x 30 sec each LE (verbal cues to find more of an isolated hamstring stretch by flexing the knee) IT band stretch (supine) 3 x 30 sec each LE Trunk rotation x 10 each side Open book stretch x 10  each side PPT with march x 20 Ice pack to lumbar spine x 10 min in hook lying     PATIENT EDUCATION:  Education details: Aquatics progressions/modifications  Person educated: Patient Education method: Consulting civil engineer, Media planner, Corporate treasurer cues, Verbal cues, and Handouts Education comprehension: verbalized understanding, returned demonstration, verbal cues required, and needs further education   HOME EXERCISE PROGRAM: Access Code: [email or web address] URL: [email or web address] Date: 01/19/2022 Prepared by: Candyce Churn  Exercises - Standing Hamstring Stretch on Chair  - 1 x daily - 7 x weekly - 1 sets - 3 reps - 30 hold - Supine Hamstring Stretch with Strap  - 1 x daily - 7 x weekly - 1 sets - 3 reps - 30 hold - Supine ITB Stretch with Strap  - 1 x daily - 7 x weekly - 1 sets - 3 reps - 30  hold -  Supine Figure 4 Piriformis Stretch  - 1 x daily - 7 x weekly - 1 sets - 3 reps - 30 hold - Supine Posterior Pelvic Tilt  - 1 x daily - 7 x weekly - 3 sets - 10 reps - Supine Piriformis Stretch with Towel  - 1 x daily - 7 x weekly - 1 sets - 2 reps - 20 sec hold - Standing 'L' Stretch at Counter  - 1 x daily - 7 x weekly - 1 sets - 2 reps - 20 sec hold - Supine 90/90 Alternating Heel Touches with Posterior Pelvic Tilt  - 1 x daily - 7 x weekly - 1 sets - 20 repsAccess Code: [email or web address] URL: [email or web address] Date: 12/29/2021 Prepared by: Shelby Dubin Menke   ASSESSMENT:  CLINICAL IMPRESSION: Trialed gentle trunk rotation and side bending while submerged to distal neck with some tolerance.  Continued limited tolerance for Lt hip ext or abdct exercises. Pt remains motivated and participates in water to her best capacity.  Intermittent spasming and increases in low back pain occurs throughout session.  Will continue to progress as tolerated.    OBJECTIVE IMPAIRMENTS Abnormal gait, decreased strength, increased muscle spasms, impaired flexibility, postural dysfunction, and  pain.   ACTIVITY LIMITATIONS carrying, lifting, bending, sitting, standing, squatting, sleeping, stairs, transfers, bed mobility, and dressing  PARTICIPATION LIMITATIONS: meal prep, cleaning, laundry, interpersonal relationship, driving, shopping, community activity, occupation, and yard work  PERSONAL FACTORS Fitness, Past/current experiences, Time since onset of injury/illness/exacerbation, and 1 comorbidity: PTSD, depression, anxiety  are also affecting patient's functional outcome.   REHAB POTENTIAL: Fair due to multiple injuries and deformities post surgery  CLINICAL DECISION MAKING: Evolving/moderate complexity  EVALUATION COMPLEXITY: Moderate   GOALS: Goals reviewed with patient? Yes  SHORT TERM GOALS: Target date: 01/11/2022  Pain report to be no greater than 5/10. Baseline: Goal status: MET  2.  Patient will be independent with initial HEP  Baseline:  Goal status: MET  LONG TERM GOALS: Target date: 04/04/2022  Patient to be independent with advanced HEP  Baseline:  Goal status: IN PROGRESS  2.  Patient to report pain no increase in pain while performing desired community tasks. Baseline:  Goal status: IN PROGRESS  3.  FOTO to improve to 54% to demonstrate improvements in functional tasks. Baseline: 46% Goal status: IN PROGRESS  4.  Lumbar ROM to improve to [facility] on all planes of motion Baseline:  Goal status: IN PROGRESS  5.  Patient to be able to start a walking program and increase by 1/4 mile per session.   Baseline: has not attempted yet - 03/13/22 Goal status: Ongoing      PLAN: PT FREQUENCY: 2x/week  PT DURATION: 8 weeks  PLANNED INTERVENTIONS: Therapeutic exercises, Therapeutic activity, Neuromuscular re-education, Balance training, Gait training, Patient/Family education, Self Care, Joint mobilization, Joint manipulation, Aquatic Therapy, Dry Needling, Electrical stimulation, Spinal manipulation, Spinal mobilization, Cryotherapy, Moist heat,  Taping, Traction, Ultrasound, Manual therapy, and Re-evaluation.  PLAN FOR NEXT SESSION:  Aquatic PT  Kerin Perna, PTA 03/13/22 12:37 PM [facility] [address] [place], [place], [address] Phone: [phone]   Fax:  [phone]

## 2022-03-15 ENCOUNTER — Ambulatory Visit (HOSPITAL_BASED_OUTPATIENT_CLINIC_OR_DEPARTMENT_OTHER): Payer: Medicare Other | Admitting: Physical Therapy

## 2022-03-15 ENCOUNTER — Encounter: Payer: Medicare Other | Admitting: Rehabilitative and Restorative Service Providers"

## 2022-03-15 ENCOUNTER — Encounter (HOSPITAL_BASED_OUTPATIENT_CLINIC_OR_DEPARTMENT_OTHER): Payer: Self-pay | Admitting: Physical Therapy

## 2022-03-15 DIAGNOSIS — M5459 Other low back pain: Secondary | ICD-10-CM | POA: Diagnosis not present

## 2022-03-15 DIAGNOSIS — R262 Difficulty in walking, not elsewhere classified: Secondary | ICD-10-CM

## 2022-03-15 DIAGNOSIS — M6281 Muscle weakness (generalized): Secondary | ICD-10-CM

## 2022-03-15 NOTE — Therapy (Signed)
OUTPATIENT PHYSICAL THERAPY TREATMENT NOTE    Patient Name: Lacey French MRN: [id number] DOB:[birth year], 57 y.o., female Today's Date: 03/15/2022   PT End of Session - 03/15/22 1350     Visit Number 22    Date for PT Re-Evaluation 04/04/22    Authorization Type Medicare    Progress Note Due on Visit 25    PT Start Time 0900    PT Stop Time 0945    PT Time Calculation (min) 45 min    Activity Tolerance Patient tolerated treatment well;Patient limited by pain    Behavior During Therapy [facility] for tasks assessed/performed                  Past Medical History:  Diagnosis Date   Allergy    Anxiety    Arthritis    Asthma    COPD (chronic obstructive pulmonary disease) ([place])    Depression    Hyperlipidemia    PTSD (post-traumatic stress disorder)    Past Surgical History:  Procedure Laterality Date   BREAST LUMPECTOMY Left    2, both benign   BREAST SURGERY     REDUCTION   COLONOSCOPY N/A 03/15/2021   Procedure: COLONOSCOPY;  Surgeon: Otis Brace, MD;  Location: [facility];  Service: Gastroenterology;  Laterality: N/A;  pt Allergic to Propofol   JOINT REPLACEMENT     L shoulder, L knee   POLYPECTOMY  03/15/2021   Procedure: POLYPECTOMY;  Surgeon: Otis Brace, MD;  Location: [facility];  Service: Gastroenterology;;   REDUCTION MAMMAPLASTY Bilateral    TONSILLECTOMY     Patient Active Problem List   Diagnosis Date Noted   Acute pain of left knee 11/03/2020   Acute left ankle pain 11/03/2020   Left hand pain 11/03/2020   Carpal tunnel syndrome of left wrist 05/16/2017   PTSD (post-traumatic stress disorder) 04/10/2016   Psychosis ([place]) 04/10/2016   Anxiety 04/10/2016   Allergy 04/10/2016   Asthma 04/10/2016   Depression 04/10/2016    PCP: Kathyrn Lass, MD  REFERRING PROVIDER: Inez Catalina, MD   REFERRING DIAG: M54.50 (ICD-10-CM) - Low back pain, unspecified   Rationale for Evaluation and Treatment Rehabilitation  THERAPY DIAG:   Other low back pain  Muscle weakness (generalized)  Difficulty in walking, not elsewhere classified  ONSET DATE: 12/07/2021  SUBJECTIVE:                                                                                                                                                                                           SUBJECTIVE STATEMENT: Patient reports overall she feels she has slightly more ROM/less stiffness but overall no  real decrease in pain.  Dr Sheppard Coil has ordered an MRI which she is waiting to get scheduled.  PERTINENT HISTORY:  Injured in [place] of [facility] on 9/11  PAIN:  Are you having pain? Yes: NPRS scale: 4/10 Pain location: low back Pain description: aching Aggravating factors: hurts all the time Relieving factors: nothing   PRECAUTIONS: None  WEIGHT BEARING RESTRICTIONS No  FALLS:  Has patient fallen in last 6 months? No  LIVING ENVIRONMENT: Lives with: lives with their family Lives in: House/apartment   OCCUPATION: disabled  PLOF: Independent, Independent with basic ADLs, Independent with household mobility without device, Independent with community mobility without device, Independent with homemaking with ambulation, Independent with gait, and Independent with transfers  PATIENT GOALS She hopes to gain relief for her back and knees.    OBJECTIVE:   DIAGNOSTIC FINDINGS:  None listed  PATIENT SURVEYS:  Eval:  FOTO  46% (projected 54% by visit 12) 01/19/22: 53% 02/07/22: 36%  SCREENING FOR RED FLAGS: Bowel or bladder incontinence: No Spinal tumors: No Cauda equina syndrome: No Compression fracture: No Abdominal aneurysm: No  COGNITION:  Overall cognitive status: Within functional limits for tasks assessed and emotional distress due to 9/11 events      SENSATION: [facility]  MUSCLE LENGTH: Hamstrings: Right 60 deg; Left 50 deg Thomas test: Right pos ; Left pos   01/19/22: patient too heavily guarded  02/07/22: patient still  heavily guarded  POSTURE: rounded shoulders and decreased lumbar lordosis  PALPATION: Crepitus bilateral knees  LUMBAR ROM:   Active  A/PROM  eval A/PROM 01/19/22 A/PROM 02/07/22  Flexion Fingertips to mid shin Fingertips to mid thigh Fingertips to mid patella  Extension WFL Pain with minimal ext [facility] but with pain  Right lateral flexion Fingertips to just above joint line Fingertips to mid thigh Fingertips to distal thigh  Left lateral flexion Fingertips to just above joint line Fingertips to just above mid thigh Fingertips to distal thigh  Right rotation 50% 25% 25%  Left rotation 50% 25% 25%   (Blank rows = not tested)  LOWER EXTREMITY ROM:     WFL with exception of bilateral knee flexion limited to 100 to 120 degrees of flexion comfortably  LOWER EXTREMITY MMT:    Generally 4 to 4-/5 t/o bilateral LE's 01/19/22: Patient too guarded/ inconclusive 02/07/22: Inconclusive  LUMBAR SPECIAL TESTS:  Straight leg raise test: Negative  FUNCTIONAL TESTS:  Eval: 5 times sit to stand: time contraints - not completed Timed up and go (TUG): not completed  12/16/2021:  unable to complete secondary to increased pain.  01/19/22: 5 times sit to stand: time contraints : 54.74 sec Timed up and go (TUG): 35.76 sec  02/07/22: 5 times sit to stand: time contraints : 44.60 sec Timed up and go (TUG): 25.68 sec  GAIT: Distance walked: 50 Assistive device utilized: None Level of assistance: Complete Independence Comments: antalgic    TODAY'S TREATMENT: 03/15/22: Pt seen for aquatic therapy today.  Treatment took place in water 3.25-4.5 ft in depth at the [place]. Temp of water was 92.  Pt entered/exited the pool independently via steps with hand rail.  In 4 ft 8" water: - suspended with arms over yellow noodle for spine decompression; L / R hip flexor stretch  - backward walking (1.5 laps) with UE supported by yellow noodle, cues for heel strike and straight  knee with LLE (stays on toes); side stepping R/L - holding wall: 3 way toe taps x 5 each (  hip abdct and ext not tolerated on LLE); heel raises x 10  - back against wall and solid noodle under R thigh at knee, blue noodle under L thigh (passive hip flexion stretch into hip ext to neutral) x 10 each; blue noodle (with hole) at ankle for R hamstring stretch x 30s    03/13/22: Pt seen for aquatic therapy today.  Treatment took place in water 3.25-4.5 ft in depth at the [place]. Temp of water was 92.  Pt entered/exited the pool independently via steps with hand rail.  In 4 ft 8" water: - suspended with arms over yellow noodle for spine decompression; L / R hip flexor stretch  - backward walking (1.5 laps) with UE supported by yellow noodle, cues for heel strike and straight knee with LLE (stays on toes); side stepping R/L - holding wall: 3 way toe taps x 5 each ( hip abdct and ext not tolerated on LLE); heel raises x 10  - back against wall and solid noodle under R thigh at knee, blue noodle under L thigh (passive hip flexion stretch into hip ext to neutral) x 10 each; blue noodle (with hole) at ankle for R hamstring stretch x 30s - at bench in water:  trial of plank for neutral spine with yellow noodle at hips (not tolerated), with blue noodle at hips (not tolerated) ,Opp arm/leg lift x 5 each (holding 5 sec each)then slow single knee towards chest for ROM x 2, x 2 sets; Standing bilat overhead stretch -   - return to deeper water for decompression: gentle twist with shoulder flexion (to shoulder height) crossing midline x 5 each side; then scap depression with gentle trunk side bend x 8 each side (lateral neck flexion not tolerated).    03/10/22: Pt seen for aquatic therapy today.  Treatment took place in water 3.25-4.5 ft in depth at the [place] [facility]. Temp of water was 92.  Pt entered/exited the pool independently via steps with hand rail.  In 4 ft 8" water: -  suspended with arms over yellow noodle for spine decompression - backward walking (2 laps) with UE supported by yellow noodle, cues for heel strike and straight knee with LLE (stays on toes)  - holding wall: Lt toe taps forward, Rt toe taps to back, Rt toe taps to side x 5 each ( hip abdct not tolerated)  - back against wall and solid noodle under thigh at knee (passive hip flexion stretch into hip ext to neutral); with RLE, knee flex/ext with noodle under knee (nerve glide, head forward -1 rep)  - holding corner and LE suspended:  bilat clam x 8, cycling (slow) - at bench in water:  plank for neutral spine, then slow single knee towards chest for ROM x 2, x 2 sets - unilateral  calf stretch at stairs   03/07/22: Pt seen for aquatic therapy today.  Treatment took place in water 3.25-4.5 ft in depth at the [place]. Temp of water was 92.  Pt entered/exited the pool independently via steps with hand rail. In 4 ft 8" water: - suspended with arms over yellow noodle for spine decompression - backward walking (2 laps) and side stepping (1/2 width) with UE supported by yellow noodle - holding wall: wide stance and light weight shifts side to side with knee flexion (not tolerated); hamstring curls; partial heel raises; attempt at toe raises; partial hip hinge to neutral  -straddling yellow noodle:  cycling with breast stroke arms -  back against wall and solid noodle under thigh at knee (passive hip flexion stretch into hip ext to neutral x 5-6 each) - bilat calf stretch at stairs   03/03/22: Pt seen for aquatic therapy today.  Treatment took place in water 3.25-4.5 ft in depth at the [place]. Temp of water was 92.  Pt entered/exited the pool independently via steps with hand rail. In 4 ft 8" water: - arms in T with attempt at micro truck rotation R/L with cervical rotation - backward walking without/with short reciprocal arm swing - side stepping with short range  shoulder abdct/addct R/L (L more difficult than R) - with UE supported on yellow noodle (in front): toe taps crossing midline, toe taps into abdct, toe taps into hip ext x 10 each -holding wall:  unilateral hip flexion with LE motion like bicycle revolution x 10 - holding short blue noodle,  tricep push downs x10, then straight arm pull downs for TrA - forward walk with row motion holding short blue noodle  (only able to take 2-3 steps due to spasms)  02/28/22: Pt seen for aquatic therapy today.  Treatment took place in water 3.25-4.5 ft in depth at the [place] [facility]. Temp of water was 92.  Pt entered/exited the pool independently via steps with hand rail.   In 4 ft 8" water: - with yellow solid noodle under arms:  backward/ forward walking (slow, and to tolerance),  side stepping - standard stance with 1/2 submerged kickboard, partial push/pull - standard stance with fins on wrists: shoulder addct/abdct, horiz abdct/add, reciprocal arm swing - reciprocal arm swing with backward gait - supported with yellow solid noodle:  front/back toe taps x 5 each, side to side toe taps -return to suspended position with arms on solid noodle, then walking backwards; 2 sets 5 of 1/2 diamond LE - TrA set with short blue noodle pull down x 10 - on land, trial of seated Rt hip flexor stretch (at end of bench with UE support on bench and Rt knee flexed) - very limited tolerance and increased time to reach position.    Pt requires the buoyancy and hydrostatic pressure of water for support, and to offload joints by unweighting joint load by at least 50 % in navel deep water and by at least 75-80% in chest to neck deep water.  Viscosity of the water is needed for resistance of strengthening. Water current perturbations provides challenge to standing balance requiring increased core activation.  02/08/2022: Nustep level 4 x5 min with PT present to discuss status Supine posterior pelvic tilt 2x10 Supine  posterior pelvic tilt with march 2x10 Hamstring stretch (supine) 3 x 30 sec each LE (verbal cues to find more of an isolated hamstring stretch by flexing the knee) IT band stretch (supine) 3 x 30 sec each LE Sidelying open book x10 bilat Manual Therapy:  Grade 2/3 joint mobilizations along thoracic and lumbar spine.  Soft tissue mobilization along lumbar paraspinals.   02/07/22: Nustep x 5 min level 4 PPT x 20 Trunk rotation in hook lying x 10 each side PPT with march x 20 Hamstring stretch (supine) 3 x 30 sec each LE (verbal cues to find more of an isolated hamstring stretch by flexing the knee) IT band stretch (supine) 3 x 30 sec each LE Reassessment for recert: see above  41/96/22: PPT x 20 Seated piriformis stretch 3 x 10 sec (using strap, patient unable to get ankle on top of knee) Hamstring stretch (supine) 3  x 30 sec each LE (verbal cues to find more of an isolated hamstring stretch by flexing the knee) IT band stretch (supine) 3 x 30 sec each LE Trunk rotation x 10 each side Open book stretch x 10 each side PPT with march x 20 Ice pack to lumbar spine x 10 min in hook lying     PATIENT EDUCATION:  Education details: Aquatics progressions/modifications  Person educated: Patient Education method: Consulting civil engineer, Demonstration, Corporate treasurer cues, Verbal cues, and Handouts Education comprehension: verbalized understanding, returned demonstration, verbal cues required, and needs further education   HOME EXERCISE PROGRAM: Access Code: [email or web address] URL: [email or web address] Date: 01/19/2022 Prepared by: Candyce Churn  Exercises - Standing Hamstring Stretch on Chair  - 1 x daily - 7 x weekly - 1 sets - 3 reps - 30 hold - Supine Hamstring Stretch with Strap  - 1 x daily - 7 x weekly - 1 sets - 3 reps - 30 hold - Supine ITB Stretch with Strap  - 1 x daily - 7 x weekly - 1 sets - 3 reps - 30  hold - Supine Figure 4 Piriformis Stretch  - 1 x daily - 7 x weekly - 1 sets  - 3 reps - 30 hold - Supine Posterior Pelvic Tilt  - 1 x daily - 7 x weekly - 3 sets - 10 reps - Supine Piriformis Stretch with Towel  - 1 x daily - 7 x weekly - 1 sets - 2 reps - 20 sec hold - Standing 'L' Stretch at Counter  - 1 x daily - 7 x weekly - 1 sets - 2 reps - 20 sec hold - Supine 90/90 Alternating Heel Touches with Posterior Pelvic Tilt  - 1 x daily - 7 x weekly - 1 sets - 20 repsAccess Code: [email or web address] URL: [email or web address] Date: 12/29/2021 Prepared by: Shelby Dubin Menke   ASSESSMENT:  CLINICAL IMPRESSION: Pt reports increased pain after last session I do believe from trialed gentle trunk rotation.  Discontinued.  Pt edu on use of ibuprofen as she reports she has been taking high does.  She VU effect on kidneys as well as blood thinning effect. She VU of use of pain scale and uncontrolled pain/ what effects that has on BP, brain function,sleep patterns and HR. Physical therapy interventions have decreased her ms tightness but has not had a significant effect on high levels of pain. She is encouraged to talk to MD in regards to proper med she can take for improved pain control until diagnostics indicate dysfunction. She has been waiting to be scheduled for MRI.      OBJECTIVE IMPAIRMENTS Abnormal gait, decreased strength, increased muscle spasms, impaired flexibility, postural dysfunction, and pain.   ACTIVITY LIMITATIONS carrying, lifting, bending, sitting, standing, squatting, sleeping, stairs, transfers, bed mobility, and dressing  PARTICIPATION LIMITATIONS: meal prep, cleaning, laundry, interpersonal relationship, driving, shopping, community activity, occupation, and yard work  PERSONAL FACTORS Fitness, Past/current experiences, Time since onset of injury/illness/exacerbation, and 1 comorbidity: PTSD, depression, anxiety  are also affecting patient's functional outcome.   REHAB POTENTIAL: Fair due to multiple injuries and deformities post surgery  CLINICAL  DECISION MAKING: Evolving/moderate complexity  EVALUATION COMPLEXITY: Moderate   GOALS: Goals reviewed with patient? Yes  SHORT TERM GOALS: Target date: 01/11/2022  Pain report to be no greater than 5/10. Baseline: Goal status: MET  2.  Patient will be independent with initial HEP  Baseline:  Goal status: MET  LONG TERM GOALS: Target date: 04/04/2022  Patient to be independent with  advanced HEP  Baseline:  Goal status: IN PROGRESS  2.  Patient to report pain no increase in pain while performing desired community tasks. Baseline:  Goal status: IN PROGRESS  3.  FOTO to improve to 54% to demonstrate improvements in functional tasks. Baseline: 46% Goal status: IN PROGRESS  4.  Lumbar ROM to improve to [facility] on all planes of motion Baseline:  Goal status: IN PROGRESS  5.  Patient to be able to start a walking program and increase by 1/4 mile per session.   Baseline: has not attempted yet - 03/13/22 Goal status: Ongoing      PLAN: PT FREQUENCY: 2x/week  PT DURATION: 8 weeks  PLANNED INTERVENTIONS: Therapeutic exercises, Therapeutic activity, Neuromuscular re-education, Balance training, Gait training, Patient/Family education, Self Care, Joint mobilization, Joint manipulation, Aquatic Therapy, Dry Needling, Electrical stimulation, Spinal manipulation, Spinal mobilization, Cryotherapy, Moist heat, Taping, Traction, Ultrasound, Manual therapy, and Re-evaluation.  PLAN FOR NEXT SESSION:  Aquatic PT  [facility] [place]) Kimon Loewen MPT 03/15/22 1:51 PM [facility] [address] [place], [place], [address] Phone: [phone]   Fax:  [phone]

## 2022-03-16 ENCOUNTER — Ambulatory Visit
Admission: RE | Admit: 2022-03-16 | Discharge: 2022-03-16 | Disposition: A | Discharge status: Home / Self Care | Payer: Medicare Other | Source: Ambulatory Visit | Attending: Physical Medicine and Rehabilitation | Admitting: Physical Medicine and Rehabilitation

## 2022-03-16 DIAGNOSIS — M545 Low back pain, unspecified: Secondary | ICD-10-CM

## 2022-03-16 MED ORDER — GADOPICLENOL 0.5 MMOL/ML IV SOLN
10.0000 mL | Freq: Once | INTRAVENOUS | Status: AC | PRN
  Administered 2022-03-16: 10 mL via INTRAVENOUS

## 2022-03-22 ENCOUNTER — Ambulatory Visit (HOSPITAL_BASED_OUTPATIENT_CLINIC_OR_DEPARTMENT_OTHER): Payer: Medicare Other | Admitting: Physical Therapy

## 2022-03-22 ENCOUNTER — Encounter (HOSPITAL_BASED_OUTPATIENT_CLINIC_OR_DEPARTMENT_OTHER): Payer: Self-pay

## 2022-03-24 ENCOUNTER — Ambulatory Visit: Payer: Medicare Other | Attending: Sports Medicine | Admitting: Rehabilitative and Restorative Service Providers"

## 2022-03-24 ENCOUNTER — Ambulatory Visit (HOSPITAL_BASED_OUTPATIENT_CLINIC_OR_DEPARTMENT_OTHER): Payer: Medicare Other | Admitting: Physical Therapy

## 2022-03-24 ENCOUNTER — Encounter: Payer: Self-pay | Admitting: Rehabilitative and Restorative Service Providers"

## 2022-03-24 DIAGNOSIS — R252 Cramp and spasm: Secondary | ICD-10-CM | POA: Insufficient documentation

## 2022-03-24 DIAGNOSIS — M6281 Muscle weakness (generalized): Secondary | ICD-10-CM | POA: Insufficient documentation

## 2022-03-24 DIAGNOSIS — M5459 Other low back pain: Secondary | ICD-10-CM | POA: Insufficient documentation

## 2022-03-24 DIAGNOSIS — R262 Difficulty in walking, not elsewhere classified: Secondary | ICD-10-CM | POA: Diagnosis present

## 2022-03-24 DIAGNOSIS — R293 Abnormal posture: Secondary | ICD-10-CM | POA: Diagnosis present

## 2022-03-24 NOTE — Therapy (Signed)
OUTPATIENT PHYSICAL THERAPY TREATMENT NOTE AND DISCHARGE SUMMARY   Patient Name: Lacey French MRN: [id number] DOB:1965-04-15, 57 y.o., female Today's Date: 03/24/2022   PT End of Session - 03/24/22 1108     Visit Number 23    Date for PT Re-Evaluation 04/04/22    Authorization Type Medicare    Progress Note Due on Visit 25    PT Start Time 1100    PT Stop Time 1140    PT Time Calculation (min) 40 min    Activity Tolerance Patient limited by pain    Behavior During Therapy WFL for tasks assessed/performed                  Past Medical History:  Diagnosis Date   Allergy    Anxiety    Arthritis    Asthma    COPD (chronic obstructive pulmonary disease) ([place])    Depression    Hyperlipidemia    PTSD (post-traumatic stress disorder)    Past Surgical History:  Procedure Laterality Date   BREAST LUMPECTOMY Left    2, both benign   BREAST SURGERY     REDUCTION   COLONOSCOPY N/A 03/15/2021   Procedure: COLONOSCOPY;  Surgeon: Otis Brace, MD;  Location: [facility];  Service: Gastroenterology;  Laterality: N/A;  pt Allergic to Propofol   JOINT REPLACEMENT     L shoulder, L knee   POLYPECTOMY  03/15/2021   Procedure: POLYPECTOMY;  Surgeon: Otis Brace, MD;  Location: [facility];  Service: Gastroenterology;;   REDUCTION MAMMAPLASTY Bilateral    TONSILLECTOMY     Patient Active Problem List   Diagnosis Date Noted   Acute pain of left knee 11/03/2020   Acute left ankle pain 11/03/2020   Left hand pain 11/03/2020   Carpal tunnel syndrome of left wrist 05/16/2017   PTSD (post-traumatic stress disorder) 04/10/2016   Psychosis ([place]) 04/10/2016   Anxiety 04/10/2016   Allergy 04/10/2016   Asthma 04/10/2016   Depression 04/10/2016    PCP: Kathyrn Lass, MD  REFERRING PROVIDER: Inez Catalina, MD   REFERRING DIAG: M54.50 (ICD-10-CM) - Low back pain, unspecified   Rationale for Evaluation and Treatment Rehabilitation  THERAPY DIAG:  Other low back  pain  Muscle weakness (generalized)  Difficulty in walking, not elsewhere classified  Cramp and spasm  Abnormal posture  ONSET DATE: 12/07/2021  SUBJECTIVE:                                                                                                                                                                                           SUBJECTIVE STATEMENT: Patient states that she will follow up with  Dr Brien Few on Wednesday for follow up from MRI.  PERTINENT HISTORY:  Injured in [place] [facility] on 9/11  PAIN:  Are you having pain? Yes: NPRS scale: 4/10 Pain location: low back Pain description: aching Aggravating factors: hurts all the time Relieving factors: nothing   PRECAUTIONS: None  WEIGHT BEARING RESTRICTIONS No  FALLS:  Has patient fallen in last 6 months? No  LIVING ENVIRONMENT: Lives with: lives with their family Lives in: House/apartment   OCCUPATION: disabled  PLOF: Independent, Independent with basic ADLs, Independent with household mobility without device, Independent with community mobility without device, Independent with homemaking with ambulation, Independent with gait, and Independent with transfers  PATIENT GOALS She hopes to gain relief for her back and knees.    OBJECTIVE:   DIAGNOSTIC FINDINGS:  Lumbar MRI on 03/16/2022: IMPRESSION: Transitional anatomy with lumbarization of S1. The last disc space is labeled S1-S2.   1. Mild degenerative changes of the lumbar spine. No evidence of high grade spinal canal or neuroforaminal stenosis. 2. 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, but further evaluation with a CT of the lumbar spine is recommended for more definitive characterization  PATIENT SURVEYS:  Eval:  FOTO  46% (projected 54% by visit 12) 01/19/22: 53% 02/07/22: 36% 03/24/2022:  FOTO 36%  SCREENING FOR RED FLAGS: Bowel or bladder incontinence:  No Spinal tumors: No Cauda equina syndrome: No Compression fracture: No Abdominal aneurysm: No  COGNITION:  Overall cognitive status: Within functional limits for tasks assessed and emotional distress due to 9/11 events      SENSATION: [facility]  MUSCLE LENGTH: Hamstrings: Right 60 deg; Left 50 deg Thomas test: Right pos ; Left pos   01/19/22: patient too heavily guarded  02/07/22: patient still heavily guarded  POSTURE: rounded shoulders and decreased lumbar lordosis  PALPATION: Crepitus bilateral knees  LUMBAR ROM:   Active  A/PROM  eval A/PROM 01/19/22 A/PROM 02/07/22  Flexion Fingertips to mid shin Fingertips to mid thigh Fingertips to mid patella  Extension WFL Pain with minimal ext [facility] but with pain  Right lateral flexion Fingertips to just above joint line Fingertips to mid thigh Fingertips to distal thigh  Left lateral flexion Fingertips to just above joint line Fingertips to just above mid thigh Fingertips to distal thigh  Right rotation 50% 25% 25%  Left rotation 50% 25% 25%   (Blank rows = not tested)  LOWER EXTREMITY ROM:     WFL with exception of bilateral knee flexion limited to 100 to 120 degrees of flexion comfortably  LOWER EXTREMITY MMT:    Generally 4 to 4-/5 t/o bilateral LE's 01/19/22: Patient too guarded/ inconclusive 02/07/22: Inconclusive  LUMBAR SPECIAL TESTS:  Straight leg raise test: Negative  FUNCTIONAL TESTS:  Eval: 5 times sit to stand: time contraints - not completed Timed up and go (TUG): not completed  12/16/2021:  unable to complete secondary to increased pain.  01/19/22: 5 times sit to stand: time contraints : 54.74 sec Timed up and go (TUG): 35.76 sec  02/07/22: 5 times sit to stand: time contraints : 44.60 sec Timed up and go (TUG): 25.68 sec  GAIT: Distance walked: 50 Assistive device utilized: None Level of assistance: Complete Independence Comments: antalgic    TODAY'S TREATMENT:  03/24/2021: Nustep level 5  x5 min with PT present to discuss status Supine posterior pelvic tilt 2x10 Lower trunk rotation x10 Supine posterior pelvic tilt with march 2x10 Decompression series:  chin tucks, shoulder  press, leg lengthener x10 Manual Therapy: Addaday to bilateral glutes/piriformis, lumbar paraspinals, hamstrings, and IT band   03/15/22: Pt seen for aquatic therapy today.  Treatment took place in water 3.25-4.5 ft in depth at the [facility]. Temp of water was 92.  Pt entered/exited the pool independently via steps with hand rail.  In 4 ft 8" water: - suspended with arms over yellow noodle for spine decompression; L / R hip flexor stretch  - backward walking (1.5 laps) with UE supported by yellow noodle, cues for heel strike and straight knee with LLE (stays on toes); side stepping R/L - holding wall: 3 way toe taps x 5 each ( hip abdct and ext not tolerated on LLE); heel raises x 10  - back against wall and solid noodle under R thigh at knee, blue noodle under L thigh (passive hip flexion stretch into hip ext to neutral) x 10 each; blue noodle (with hole) at ankle for R hamstring stretch x 30s    03/13/22: Pt seen for aquatic therapy today.  Treatment took place in water 3.25-4.5 ft in depth at the [place]. Temp of water was 92.  Pt entered/exited the pool independently via steps with hand rail.  In 4 ft 8" water: - suspended with arms over yellow noodle for spine decompression; L / R hip flexor stretch  - backward walking (1.5 laps) with UE supported by yellow noodle, cues for heel strike and straight knee with LLE (stays on toes); side stepping R/L - holding wall: 3 way toe taps x 5 each ( hip abdct and ext not tolerated on LLE); heel raises x 10  - back against wall and solid noodle under R thigh at knee, blue noodle under L thigh (passive hip flexion stretch into hip ext to neutral) x 10 each; blue noodle (with hole) at ankle for R hamstring stretch x 30s - at bench  in water:  trial of plank for neutral spine with yellow noodle at hips (not tolerated), with blue noodle at hips (not tolerated) ,Opp arm/leg lift x 5 each (holding 5 sec each)then slow single knee towards chest for ROM x 2, x 2 sets; Standing bilat overhead stretch -   - return to deeper water for decompression: gentle twist with shoulder flexion (to shoulder height) crossing midline x 5 each side; then scap depression with gentle trunk side bend x 8 each side (lateral neck flexion not tolerated).      PATIENT EDUCATION:  Education details: Aquatics progressions/modifications  Person educated: Patient Education method: Explanation, Demonstration, Corporate treasurer cues, Verbal cues, and Handouts Education comprehension: verbalized understanding, returned demonstration, verbal cues required, and needs further education   HOME EXERCISE PROGRAM: Access Code: [email or web address] URL: [email or web address] Date: 01/19/2022 Prepared by: Candyce Churn  Exercises - Standing Hamstring Stretch on Chair  - 1 x daily - 7 x weekly - 1 sets - 3 reps - 30 hold - Supine Hamstring Stretch with Strap  - 1 x daily - 7 x weekly - 1 sets - 3 reps - 30 hold - Supine ITB Stretch with Strap  - 1 x daily - 7 x weekly - 1 sets - 3 reps - 30  hold - Supine Figure 4 Piriformis Stretch  - 1 x daily - 7 x weekly - 1 sets - 3 reps - 30 hold - Supine Posterior Pelvic Tilt  - 1 x daily - 7 x weekly - 3 sets - 10 reps - Supine Piriformis Stretch with Towel  -  1 x daily - 7 x weekly - 1 sets - 2 reps - 20 sec hold - Standing 'L' Stretch at Counter  - 1 x daily - 7 x weekly - 1 sets - 2 reps - 20 sec hold - Supine 90/90 Alternating Heel Touches with Posterior Pelvic Tilt  - 1 x daily - 7 x weekly - 1 sets - 20 repsAccess Code: [email or web address] URL: [email or web address] Date: 12/29/2021 Prepared by: Shelby Dubin Daizee Firmin   ASSESSMENT:  CLINICAL IMPRESSION: Ms Urbanek presents to skilled PT stating that she has a new  appointment with Dr Brien Few on Wednesday to review her MRI and discuss next steps and treatments.  Patient has had some gains since working with aquatic PT, but has not had great gains towards continued decreased pain when not in the pool.  Patient has similar score on FOTO and is still scoring lower on FOTO than she was at initial evaluation.  Patient has plateaued at this time with current regiment and recommend discharge at this time from skilled PT with pt continuing HEP.  Discussed with patient about a new referral from her MD once a new treatment plan has been decided with findings of MRI.    OBJECTIVE IMPAIRMENTS Abnormal gait, decreased strength, increased muscle spasms, impaired flexibility, postural dysfunction, and pain.   ACTIVITY LIMITATIONS carrying, lifting, bending, sitting, standing, squatting, sleeping, stairs, transfers, bed mobility, and dressing  PARTICIPATION LIMITATIONS: meal prep, cleaning, laundry, interpersonal relationship, driving, shopping, community activity, occupation, and yard work  PERSONAL FACTORS Fitness, Past/current experiences, Time since onset of injury/illness/exacerbation, and 1 comorbidity: PTSD, depression, anxiety  are also affecting patient's functional outcome.   REHAB POTENTIAL: Fair due to multiple injuries and deformities post surgery  CLINICAL DECISION MAKING: Evolving/moderate complexity  EVALUATION COMPLEXITY: Moderate   GOALS: Goals reviewed with patient? Yes  SHORT TERM GOALS: Target date: 01/11/2022  Pain report to be no greater than 5/10. Baseline: Goal status: MET  2.  Patient will be independent with initial HEP  Baseline:  Goal status: MET  LONG TERM GOALS: Target date: 04/04/2022  Patient to be independent with advanced HEP  Baseline:  Goal status: MET  2.  Patient to report pain no increase in pain while performing desired community tasks. Baseline:  Goal status: NOT MET  3.  FOTO to improve to 54% to demonstrate  improvements in functional tasks. Baseline: 46% Goal status: NOT MET  4.  Lumbar ROM to improve to [facility] on all planes of motion Baseline:  Goal status: PARTIALLY MET  5.  Patient to be able to start a walking program and increase by 1/4 mile per session.   Baseline: has not attempted yet - 03/13/22 Goal status: PARTIALLY MET     PLAN: PT FREQUENCY: 2x/week  PT DURATION: 8 weeks  PLANNED INTERVENTIONS: Therapeutic exercises, Therapeutic activity, Neuromuscular re-education, Balance training, Gait training, Patient/Family education, Self Care, Joint mobilization, Joint manipulation, Aquatic Therapy, Dry Needling, Electrical stimulation, Spinal manipulation, Spinal mobilization, Cryotherapy, Moist heat, Taping, Traction, Ultrasound, Manual therapy, and Re-evaluation.    PHYSICAL THERAPY DISCHARGE SUMMARY  Recommend pt follow up with MD for treatment of findings from MRI.  Pt with scheduled appointment to see Dr Brien Few.  Patient agrees to discharge. Patient goals were not met. Patient is being discharged due to a change in medical status.     Juel Burrow, PT 03/24/22 12:08 PM  [facility] [address] [place], [place] [address] Phone # [phone] Fax [phone]

## 2022-03-24 NOTE — Patient Instructions (Signed)
RE-ALIGNMENT ROUTINE EXERCISES-OSTEOPROROSIS BASIC FOR POSTURAL CORRECTION   RE-ALIGNMENT Tips BENEFITS: 1.It helps to re-align the curves of the back and improve standing posture. 2.It allows the back muscles to rest and strengthen in preparation for more activity. FREQUENCY: Daily, even after weeks, months and years of more advanced exercises. START: 1.All exercises start in the same position: lying on the back, arms resting on the supporting surface, palms up and slightly away from the body, backs of hands down, knees bent, feet flat. 2.The head, neck, arms, and legs are supported according to specific instructions of your therapist. Copyright  [employer]. All rights reserved.    1. Decompression Exercise: Basic.   Takes compression off the vertebral bodies; increases tolerance for lying on the back; helps relieve back pain   Lie on back on firm surface, knees bent, feet flat, arms turned up, out to sides (~35 degrees). Head neck and arms supported as necessary. Time _5-15__ minutes. Surface: floor     2. Shoulder Press  Strengthens upper back extensors and scapular retractors.   Press both shoulders down. Hold _2-3__ seconds. Repeat _3-5__ times. Surface: floor        3. Head Press With Chin Tuck  Strengthens neck extensors   Tuck chin SLIGHTLY toward chest, keep mouth closed. Feel weight on back of head. Increase weight by pressing head down. Hold _2-3__ seconds. Relax. Repeat 3-5___ times. Surface: floor   4. Leg Lengthener: stretches quadratus lumborum and hip flexors.  Strengthens quads and ankle dorsiflexors.   

## 2022-03-27 ENCOUNTER — Ambulatory Visit (HOSPITAL_BASED_OUTPATIENT_CLINIC_OR_DEPARTMENT_OTHER): Payer: Medicare Other | Admitting: Physical Therapy

## 2022-03-29 ENCOUNTER — Encounter: Payer: Self-pay | Admitting: Physical Medicine and Rehabilitation

## 2022-03-29 ENCOUNTER — Ambulatory Visit (HOSPITAL_BASED_OUTPATIENT_CLINIC_OR_DEPARTMENT_OTHER): Payer: Medicare Other | Admitting: Physical Therapy

## 2022-03-29 ENCOUNTER — Ambulatory Visit: Payer: Medicare Other | Admitting: Rehabilitative and Restorative Service Providers"

## 2022-04-04 ENCOUNTER — Ambulatory Visit: Payer: Medicare Other | Admitting: Rehabilitative and Restorative Service Providers"

## 2022-04-11 ENCOUNTER — Other Ambulatory Visit (HOSPITAL_COMMUNITY): Payer: Self-pay | Admitting: Physical Medicine and Rehabilitation

## 2022-04-11 DIAGNOSIS — M47816 Spondylosis without myelopathy or radiculopathy, lumbar region: Secondary | ICD-10-CM

## 2022-04-13 ENCOUNTER — Ambulatory Visit (HOSPITAL_COMMUNITY)
Admission: RE | Admit: 2022-04-13 | Discharge: 2022-04-13 | Disposition: A | Discharge status: Home / Self Care | Payer: Medicare Other | Source: Ambulatory Visit | Attending: Physical Medicine and Rehabilitation | Admitting: Physical Medicine and Rehabilitation

## 2022-04-13 DIAGNOSIS — M47816 Spondylosis without myelopathy or radiculopathy, lumbar region: Secondary | ICD-10-CM | POA: Diagnosis present

## 2022-09-17 IMAGING — DX DG ANKLE COMPLETE 3+V*L*
3 series · 3 of 3 positions shown · non-contrast
Comparison: None.

CLINICAL DATA: Ankle pain

EXAM:
LEFT ANKLE COMPLETE - 3+ VIEW

[dg ankle complete left (1 of 3)]
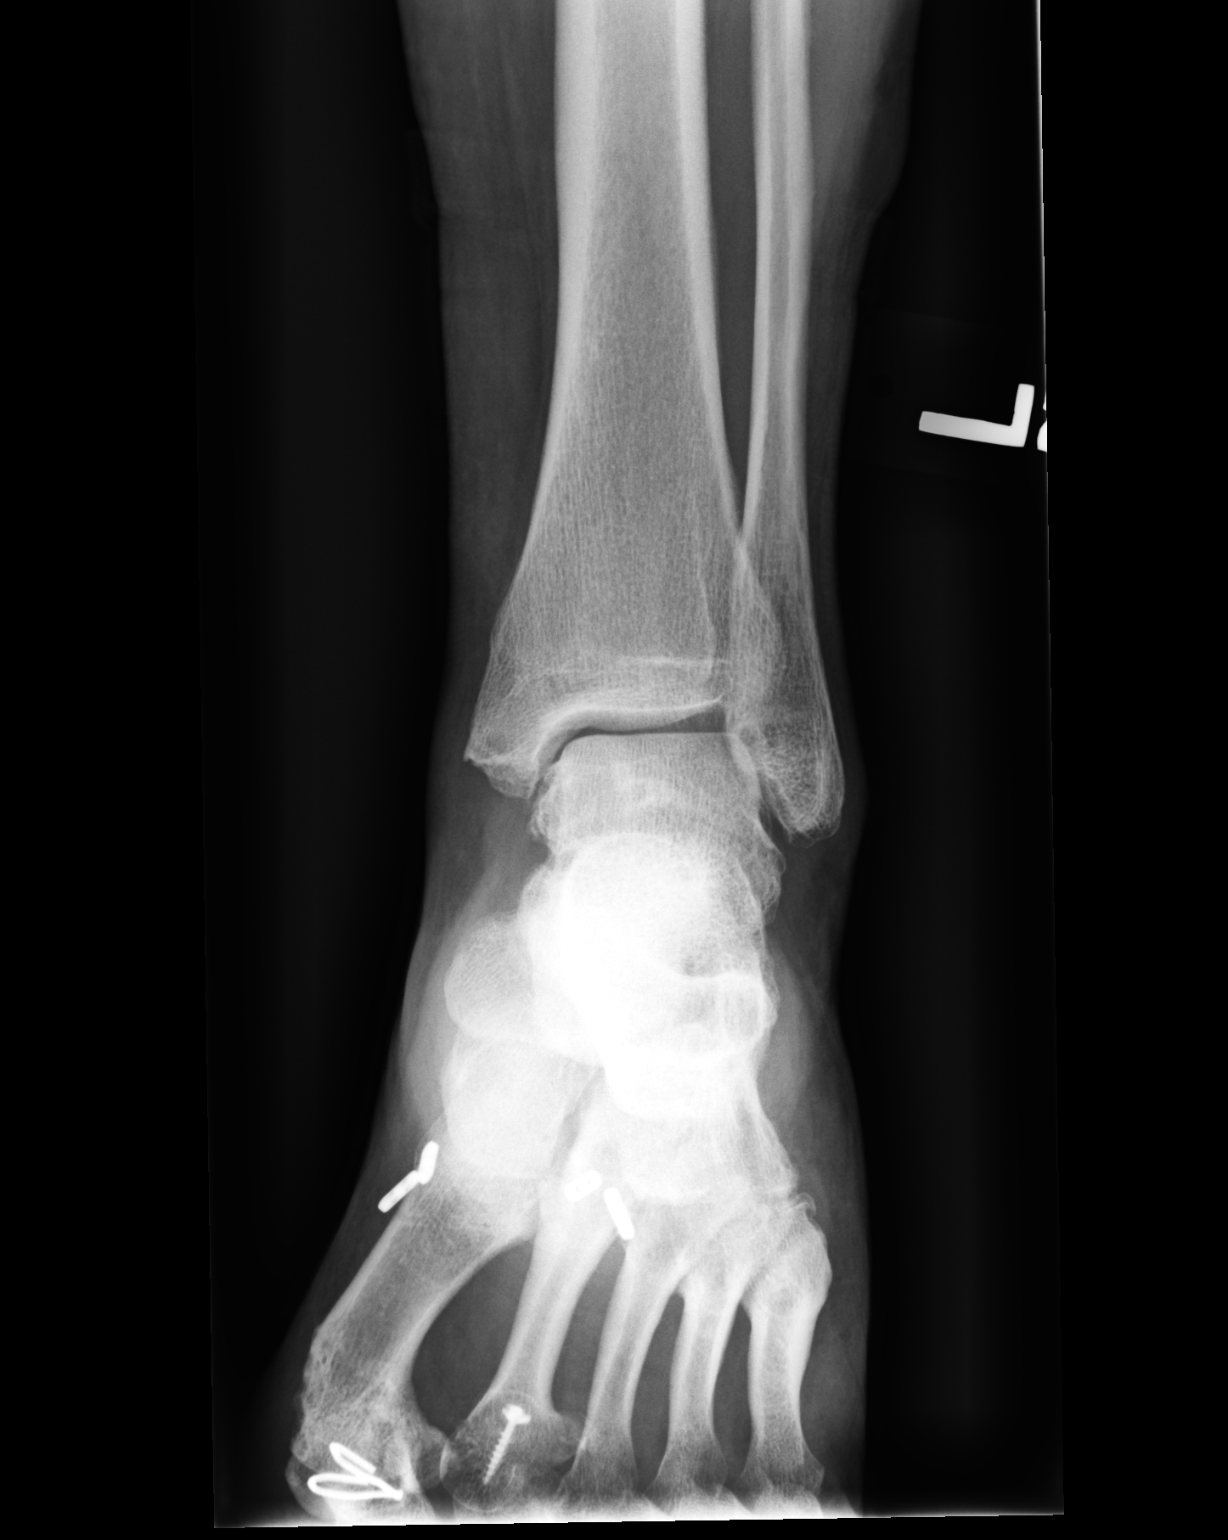

[dg ankle complete left (2 of 3)]
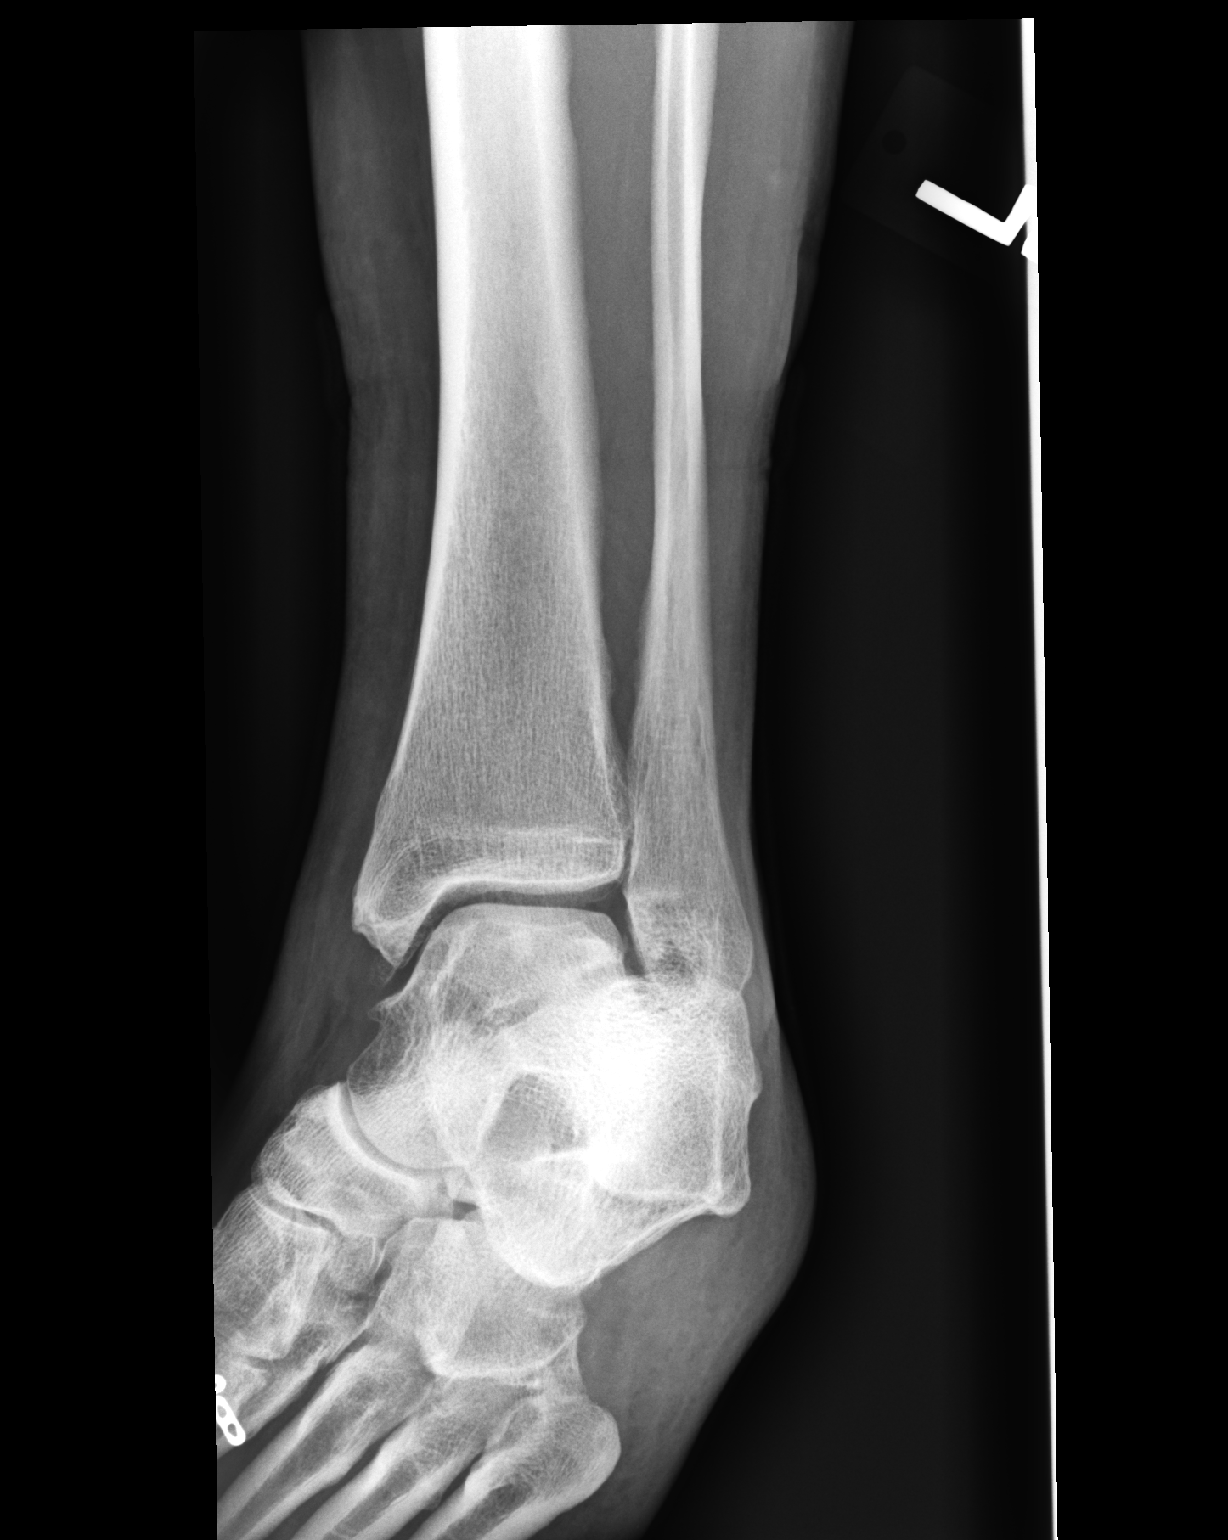

[dg ankle complete left (3 of 3)]
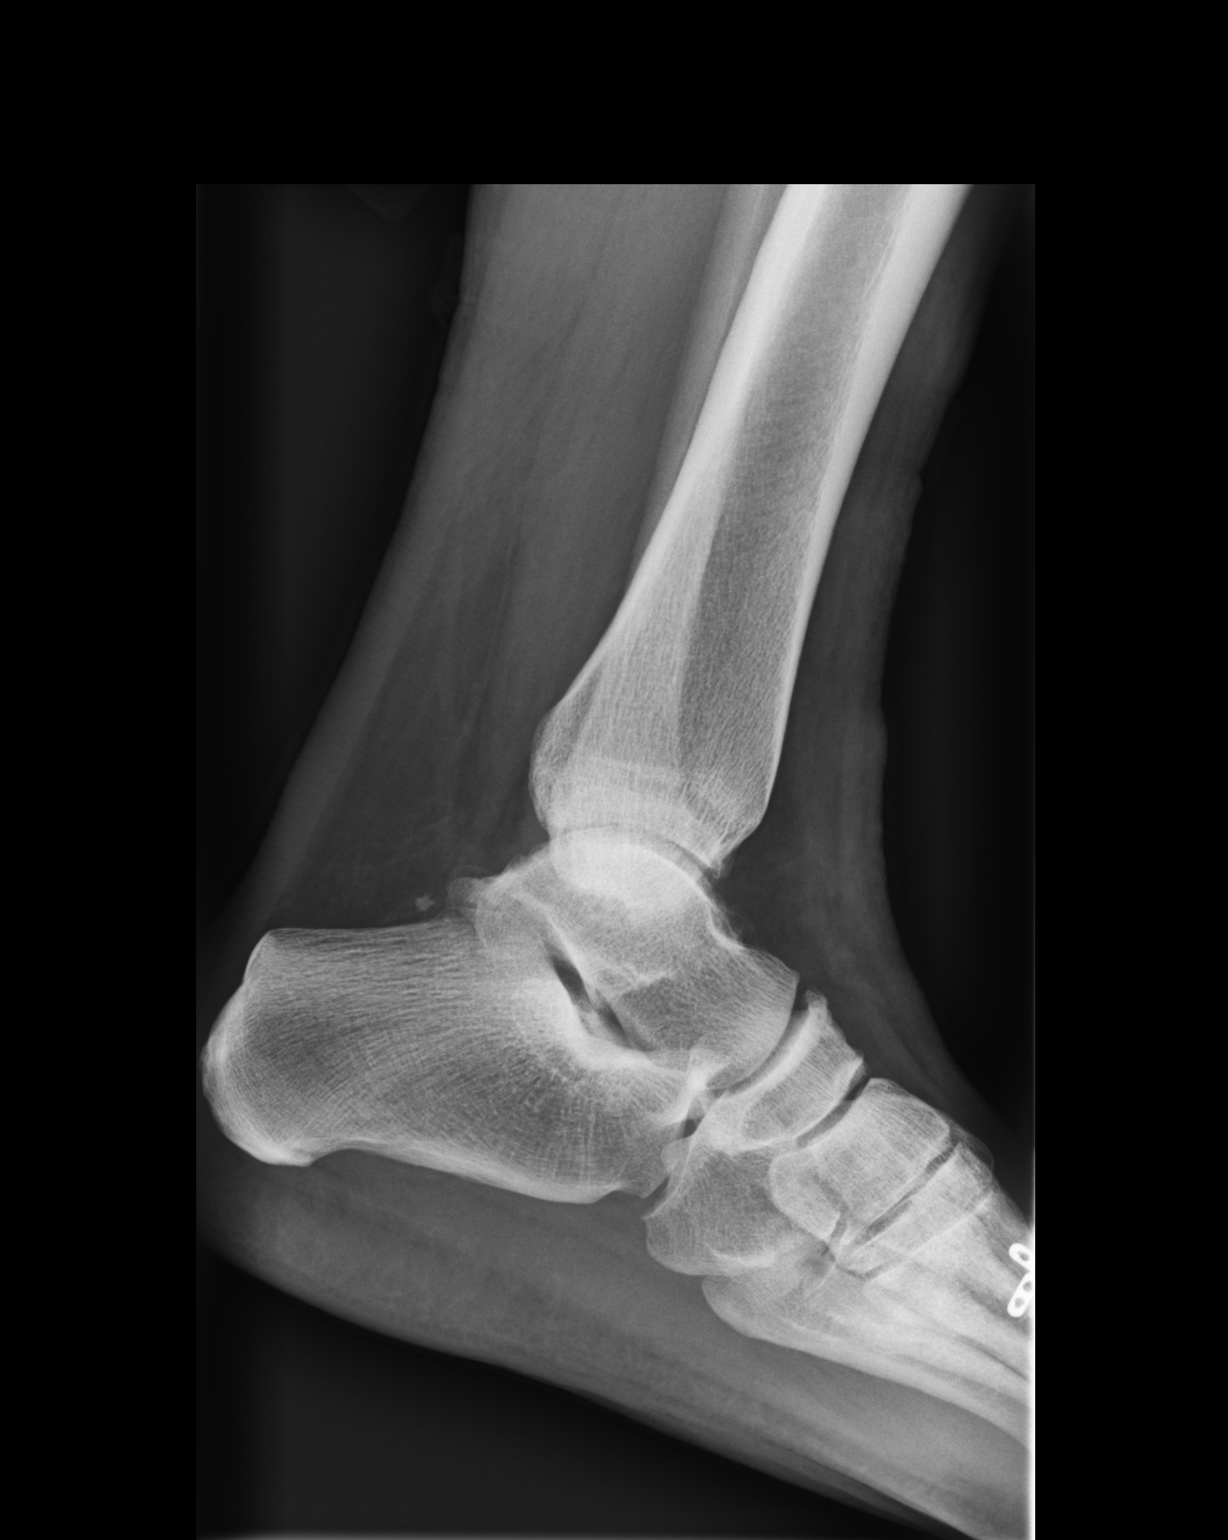

[3 of 3 positions shown; findings below may reference images not displayed]

FINDINGS: No fracture or malalignment. Mild degenerative changes medially and
laterally. Partially visualized surgical changes of the first and
second metatarsals.
IMPRESSION: Degenerative changes.  No acute osseous abnormality

## 2024-01-17 ENCOUNTER — Emergency Department (HOSPITAL_BASED_OUTPATIENT_CLINIC_OR_DEPARTMENT_OTHER)

## 2024-01-17 ENCOUNTER — Other Ambulatory Visit: Payer: Self-pay

## 2024-01-17 ENCOUNTER — Emergency Department (HOSPITAL_BASED_OUTPATIENT_CLINIC_OR_DEPARTMENT_OTHER): Admission: EM | Admit: 2024-01-17 | Discharge: 2024-01-17 | Disposition: A | Discharge status: Home / Self Care

## 2024-01-17 ENCOUNTER — Emergency Department (HOSPITAL_BASED_OUTPATIENT_CLINIC_OR_DEPARTMENT_OTHER): Admitting: Radiology

## 2024-01-17 DIAGNOSIS — R55 Syncope and collapse: Secondary | ICD-10-CM

## 2024-01-17 DIAGNOSIS — S060X0A Concussion without loss of consciousness, initial encounter: Secondary | ICD-10-CM | POA: Diagnosis not present

## 2024-01-17 DIAGNOSIS — W19XXXA Unspecified fall, initial encounter: Secondary | ICD-10-CM | POA: Insufficient documentation

## 2024-01-17 DIAGNOSIS — M545 Low back pain, unspecified: Secondary | ICD-10-CM | POA: Diagnosis not present

## 2024-01-17 LAB — COMPREHENSIVE METABOLIC PANEL WITH GFR
ALT: 27 U/L (ref 0–44)
AST: 25 U/L (ref 15–41)
Albumin: 4.5 g/dL (ref 3.5–5.0)
Alkaline Phosphatase: 71 U/L (ref 38–126)
Anion gap: 12 (ref 5–15)
BUN: 16 mg/dL (ref 6–20)
CO2: 23 mmol/L (ref 22–32)
Calcium: 10 mg/dL (ref 8.9–10.3)
Chloride: 104 mmol/L (ref 98–111)
Creatinine, Ser: 0.93 mg/dL (ref 0.44–1.00)
GFR, Estimated: >60 mL/min (ref >60)
Glucose, Bld: 91 mg/dL (ref 70–99)
Potassium: 4.3 mmol/L (ref 3.5–5.1)
Sodium: 140 mmol/L (ref 135–145)
Total Bilirubin: 0.4 mg/dL (ref 0.0–1.2)
Total Protein: 7 g/dL (ref 6.5–8.1)

## 2024-01-17 LAB — CBC
HCT: 43.2 % (ref 36.0–46.0)
Hemoglobin: 14.5 g/dL (ref 12.0–15.0)
MCH: 29 pg (ref 26.0–34.0)
MCHC: 33.6 g/dL (ref 30.0–36.0)
MCV: 86.4 fL (ref 80.0–100.0)
Platelets: 362 K/uL (ref 150–400)
RBC: 5 MIL/uL (ref 3.87–5.11)
RDW: 12.9 % (ref 11.5–15.5)
WBC: 8.4 K/uL (ref 4.0–10.5)
nRBC: 0 % (ref 0.0–0.2)

## 2024-01-17 LAB — URINALYSIS, ROUTINE W REFLEX MICROSCOPIC
Bacteria, UA: NONE SEEN
Bilirubin Urine: NEGATIVE
Glucose, UA: NEGATIVE mg/dL
Ketones, ur: NEGATIVE mg/dL
Nitrite: NEGATIVE
Protein, ur: NEGATIVE mg/dL
Specific Gravity, Urine: 1.026 (ref 1.005–1.030)
pH: 6 (ref 5.0–8.0)

## 2024-01-17 LAB — CBG MONITORING, ED: Glucose-Capillary: 92 mg/dL (ref 70–99)

## 2024-01-17 MED ORDER — NAPROXEN 500 MG PO TABS: 500.0000 mg | ORAL_TABLET | Freq: Two times a day (BID) | ORAL | 0 refills | Status: AC

## 2024-01-17 MED ORDER — METHOCARBAMOL 500 MG PO TABS: 500.0000 mg | ORAL_TABLET | Freq: Two times a day (BID) | ORAL | 0 refills | Status: AC

## 2024-01-17 MED ORDER — LIDOCAINE 5 % EX PTCH
1.0000 | MEDICATED_PATCH | Freq: Once | CUTANEOUS | Status: DC
  Administered 2024-01-17: 1 via TRANSDERMAL
  Filled 2024-01-17: qty 1

## 2024-01-17 MED ORDER — LIDOCAINE 5 % EX PTCH: 1.0000 | MEDICATED_PATCH | CUTANEOUS | 0 refills | Status: AC

## 2024-01-17 NOTE — ED Notes (Signed)
 Reviewed AVS/discharge instructions with patient. Time allotted for and all questions answered. Patient is agreeable for d/c and escorted to ED exit by staff.

## 2024-01-17 NOTE — Discharge Instructions (Addendum)
 You may take Tylenol for baseline pain control with dosages as directed on the packaging every 8 hours.  He may also use the medications we are prescribing for further pain relief.  Please follow-up with your primary doctor for further evaluation of passing out although it does sound like it may have been secondary to not eating or drinking.  Please try to maintain adequate nutrition and hydration.  Return for fevers, chills, passout, chest pain, shortness of breath or difficulty breathing, abdominal pain or or any new worsening symptoms that are concerning to

## 2024-01-17 NOTE — ED Provider Notes (Signed)
 [place] [facility]PARTMENT AT [facility] Provider Note   CSN: [id number] Arrival date & time: 01/17/24  1545     Patient presents with: Near Syncope   Lacey French is a 58 y.o. female.   Syncopal episode Monday.  Had not eaten during the day felt lightheaded and passed out.  No chest pain or shortness of breath prior to the syncopal episode.  She did fall backwards onto her back and did not strike her head.  No nausea or vomiting, complaining of pain to the back of her head, headache, neck pain and low back pain radiating down towards her left hip.  Went to urgent care and was directed to the emergency department for evaluation.   Near Syncope       Prior to Admission medications   Medication Sig Start Date End Date Taking? Authorizing Provider  atorvastatin (LIPITOR) 40 MG tablet Take 40 mg by mouth at bedtime.    [provider]  benzonatate  (TESSALON ) 100 MG capsule Take 1 capsule (100 mg total) by mouth 3 (three) times daily as needed for cough. 10/10/20   Gladis Elsie BROCKS, PA-C  buPROPion (WELLBUTRIN XL) 150 MG 24 hr tablet Take 150 mg by mouth at bedtime. 04/17/17   [provider]  doxycycline  (VIBRA -TABS) 100 MG tablet Take 1 tablet (100 mg total) by mouth 2 (two) times daily. 10/10/20   Gladis Elsie BROCKS, PA-C  LORazepam (ATIVAN) 1 MG tablet Take 0.5 mg by mouth every 8 (eight) hours.    [provider]  meloxicam  (MOBIC ) 15 MG tablet Take 1 tablet daily with food for 7 days. Then take as needed. 11/03/20   Meccariello, Con PARAS, MD  Omega-3 Fatty Acids (FISH OIL) 1200 MG CAPS Take 1,200 mg by mouth at bedtime.    [provider]  QUEtiapine (SEROQUEL) 200 MG tablet Take 100 mg by mouth at bedtime. 04/24/17   [provider]  sertraline (ZOLOFT) 100 MG tablet Take 100 mg by mouth at bedtime.    [provider]  TURMERIC PO Take 1,500 mg by mouth every evening.    [provider]  valACYclovir (VALTREX)  1000 MG tablet Take 1,000 mg by mouth daily as needed (fever blisters.).    [provider]  zolpidem (AMBIEN) 10 MG tablet Take 10 mg by mouth at bedtime.    [provider]    Allergies: Propofol , Paroxetine hcl, Neosporin [bacitracin-polymyxin b], and Prozac [fluoxetine]    Review of Systems  Cardiovascular:  Positive for near-syncope.    Updated Vital Signs BP 130/87 (BP Location: Right Arm)   Pulse 69   Temp 97.9 F (36.6 C) (Oral)   Resp 18   SpO2 94%   Physical Exam Vitals and nursing note reviewed.  Constitutional:      General: She is not in acute distress.    Appearance: She is not toxic-appearing.  HENT:     Head: Normocephalic.     Nose: Nose normal.     Mouth/Throat:     Mouth: Mucous membranes are moist.  Eyes:     Conjunctiva/sclera: Conjunctivae normal.  Cardiovascular:     Rate and Rhythm: Normal rate and regular rhythm.  Pulmonary:     Effort: Pulmonary effort is normal.     Breath sounds: Normal breath sounds.  Abdominal:     General: Abdomen is flat. There is no distension.     Palpations: Abdomen is soft.     Tenderness: There is no abdominal  tenderness. There is no guarding or rebound.  Musculoskeletal:        General: Normal range of motion.     Comments: No midline spinal tenderness.  5 out of 5 plantarflexion dorsiflexion.  5 5 grip strength.  No bony tenderness to extremities.  Chest wall stable nontender.  Pelvis stable nontender.  Does have some tenderness diffusely across her entire low back.  Skin:    General: Skin is warm and dry.     Capillary Refill: Capillary refill takes less than 2 seconds.  Neurological:     General: No focal deficit present.     Mental Status: She is alert and oriented to person, place, and time.  Psychiatric:        Mood and Affect: Mood normal.        Behavior: Behavior normal.     (all labs ordered are listed, but only abnormal results are displayed) Labs Reviewed  URINALYSIS, ROUTINE W  REFLEX MICROSCOPIC - Abnormal; Notable for the following components:      Result Value   APPearance HAZY (*)    Hgb urine dipstick TRACE (*)    Leukocytes,Ua MODERATE (*)    All other components within normal limits  COMPREHENSIVE METABOLIC PANEL WITH GFR  CBC  CBG MONITORING, ED    EKG: None  Radiology: CT Cervical Spine Wo Contrast Result Date: 01/17/2024 EXAM: CT CERVICAL SPINE WITHOUT CONTRAST 01/17/2024 07:42:15 PM TECHNIQUE: CT of the cervical spine was performed without the administration of intravenous contrast. Multiplanar reformatted images are provided for review. Automated exposure control, iterative reconstruction, and/or weight based adjustment of the mA/kV was utilized to reduce the radiation dose to as low as reasonably achievable. COMPARISON: None available. CLINICAL HISTORY: Neck trauma, uncomplicated (NEXUS/CCR neg) (Age 71-64y) FINDINGS: CERVICAL SPINE: BONES AND ALIGNMENT: Straightening of the normal cervical lordosis. Trace anterior listhesis of C4 on C5 likely related to degenerative changes. No acute fracture or traumatic malalignment. DEGENERATIVE CHANGES: Disc space narrowing most pronounced at C6-C7 with associated degenerative endplate osteophytes. Facet arthrosis throughout the cervical spine. Uncovertebral hypertrophy most pronounced at C5-C6 and C6-C7. No high grade osseous spinal canal stenosis. Foraminal narrowing at multiple levels particularly at C5-C6 and C6-C7 most pronounced on the left at C6-C7. SOFT TISSUES: No prevertebral soft tissue swelling. IMPRESSION: 1. No acute traumatic abnormality of the cervical spine. 2. Degenerative changes as above. Electronically signed by: Donnice Mania MD 01/17/2024 07:54 PM EST RP Workstation: [id number]   CT Head Wo Contrast Result Date: 01/17/2024 EXAM: CT HEAD WITHOUT CONTRAST 01/17/2024 07:42:15 PM TECHNIQUE: CT of the head was performed without the administration of intravenous contrast. Automated exposure control,  iterative reconstruction, and/or weight based adjustment of the mA/kV was utilized to reduce the radiation dose to as low as reasonably achievable. COMPARISON: None available. CLINICAL HISTORY: Head trauma, moderate-severe FINDINGS: BRAIN AND VENTRICLES: No acute hemorrhage. No evidence of acute infarct. No hydrocephalus. No extra-axial collection. No mass effect or midline shift. ORBITS: No acute abnormality. SINUSES: No acute abnormality. SOFT TISSUES AND SKULL: No acute soft tissue abnormality. No skull fracture. IMPRESSION: 1. No acute intracranial abnormality. Electronically signed by: Donnice Mania MD 01/17/2024 07:48 PM EST RP Workstation: [id number]   DG Pelvis 1-2 Views Result Date: 01/17/2024 EXAM: 1 VIEW(S) XRAY OF THE PELVIS 01/17/2024 07:37:00 PM COMPARISON: None available. CLINICAL HISTORY: trauma FINDINGS: BONES AND JOINTS: No acute fracture. No focal osseous lesion. No joint dislocation. SOFT TISSUES: The soft tissues are unremarkable. IMPRESSION: 1. No evidence of acute traumatic injury.  Electronically signed by: Oneil Devonshire MD 01/17/2024 07:46 PM EST RP Workstation: [id number]   DG Lumbar Spine 2-3 Views Result Date: 01/17/2024 EXAM: 2 or 3 VIEW(S) XRAY OF THE LUMBAR SPINE 01/17/2024 07:37:00 PM COMPARISON: Comparison study 12/07/2021, MRI from 03/16/2022. CLINICAL HISTORY: Recent syncopal episode with low back pain following injury. FINDINGS: LUMBAR SPINE: BONES: Vertebral body height is well maintained. Mild anterolisthesis is again identified at L5-S1. No compression deformities are noted. No acute fracture. DISCS AND DEGENERATIVE CHANGES: SOFT TISSUES: No acute abnormality. IMPRESSION: 1. No acute abnormality of the lumbar spine. Electronically signed by: Oneil Devonshire MD 01/17/2024 07:45 PM EST RP Workstation: [id number]     Procedures   Medications Ordered in the ED  lidocaine (LIDODERM) 5 % 1 patch (1 patch Transdermal Patch Applied 01/17/24 1947)                                     Medical Decision Making This is a 58 year old female present emergency department after a syncopal on Monday.  Sounds like an orthostatic episode.  Has not had any symptoms or chest pain since then.  Likely with concussion.  CT head C-spine negative.  Labs ordered in triage without significant abnormality.  No anemia.  No metabolic derangement.  Normal kidney function.  No evidence of infection.  UA was negative.  X-rays of pelvis and lumbar spine without traumatic injury.  She had equal breath sounds and no tenderness to her chest wall.  Low suspicion for intrathoracic trauma.  Given lidocaine patch here.  Will discharge with lidocaine patch muscle relaxer and NSAIDs.  Follow-up with PCP.  Return precautions given  Amount and/or Complexity of Data Reviewed Labs: ordered. Radiology: ordered.  Risk Prescription drug management.      Final diagnoses:  None    ED Discharge Orders     None          Neysa Caron PARAS, DO 01/17/24 2020

## 2024-01-17 NOTE — ED Triage Notes (Signed)
 Patient reports syncopal episode earlier this week. Reports in fall she hurt her back, head and neck. States pain has worsened. Sent from urgent care.
